# Patient Record
Sex: Female | Born: 1935 | Race: White | Hispanic: No | Smoking: Former smoker
Health system: Southern US, Community
[De-identification: ages and names within clinical notes are randomized; demographics above are authoritative.]

## PROBLEM LIST (undated history)

## (undated) DIAGNOSIS — K219 Gastro-esophageal reflux disease without esophagitis: Secondary | ICD-10-CM

## (undated) DIAGNOSIS — R51 Headache: Secondary | ICD-10-CM

## (undated) DIAGNOSIS — R519 Headache, unspecified: Secondary | ICD-10-CM

## (undated) DIAGNOSIS — M542 Cervicalgia: Secondary | ICD-10-CM

## (undated) DIAGNOSIS — I10 Essential (primary) hypertension: Secondary | ICD-10-CM

## (undated) DIAGNOSIS — E785 Hyperlipidemia, unspecified: Secondary | ICD-10-CM

## (undated) DIAGNOSIS — I639 Cerebral infarction, unspecified: Secondary | ICD-10-CM

## (undated) DIAGNOSIS — M810 Age-related osteoporosis without current pathological fracture: Secondary | ICD-10-CM

## (undated) DIAGNOSIS — I714 Abdominal aortic aneurysm, without rupture, unspecified: Secondary | ICD-10-CM

## (undated) HISTORY — DX: Hyperlipidemia, unspecified: E78.5

## (undated) HISTORY — DX: Age-related osteoporosis without current pathological fracture: M81.0

## (undated) HISTORY — DX: Abdominal aortic aneurysm, without rupture, unspecified: I71.40

## (undated) HISTORY — DX: Essential (primary) hypertension: I10

## (undated) HISTORY — DX: Headache, unspecified: R51.9

## (undated) HISTORY — PX: APPENDECTOMY: SHX54

## (undated) HISTORY — PX: ILIAC ARTERY STENT: SHX1786

## (undated) HISTORY — PX: OTHER SURGICAL HISTORY: SHX169

## (undated) HISTORY — DX: Headache: R51

## (undated) HISTORY — DX: Gastro-esophageal reflux disease without esophagitis: K21.9

## (undated) HISTORY — DX: Cerebral infarction, unspecified: I63.9

## (undated) HISTORY — DX: Cervicalgia: M54.2

---

## 1998-02-17 HISTORY — PX: AUGMENTATION MAMMAPLASTY: SUR837

## 1998-02-22 ENCOUNTER — Ambulatory Visit (HOSPITAL_COMMUNITY): Admission: RE | Admit: 1998-02-22 | Discharge: 1998-02-22 | Payer: Self-pay | Admitting: Family Medicine

## 1998-04-11 ENCOUNTER — Ambulatory Visit (HOSPITAL_BASED_OUTPATIENT_CLINIC_OR_DEPARTMENT_OTHER): Admission: RE | Admit: 1998-04-11 | Discharge: 1998-04-11 | Payer: Self-pay | Admitting: Surgery

## 1998-12-14 ENCOUNTER — Encounter: Payer: Self-pay | Admitting: Family Medicine

## 1998-12-14 ENCOUNTER — Encounter: Admission: RE | Admit: 1998-12-14 | Discharge: 1998-12-14 | Payer: Self-pay | Admitting: Family Medicine

## 1999-02-25 ENCOUNTER — Encounter (INDEPENDENT_AMBULATORY_CARE_PROVIDER_SITE_OTHER): Payer: Self-pay

## 1999-02-25 ENCOUNTER — Other Ambulatory Visit: Admission: RE | Admit: 1999-02-25 | Discharge: 1999-02-25 | Payer: Self-pay | Admitting: Plastic Surgery

## 1999-12-13 ENCOUNTER — Ambulatory Visit (HOSPITAL_COMMUNITY): Admission: RE | Admit: 1999-12-13 | Discharge: 1999-12-13 | Payer: Self-pay | Admitting: Family Medicine

## 1999-12-13 ENCOUNTER — Encounter: Payer: Self-pay | Admitting: Family Medicine

## 1999-12-20 ENCOUNTER — Encounter: Admission: RE | Admit: 1999-12-20 | Discharge: 1999-12-20 | Payer: Self-pay | Admitting: Family Medicine

## 1999-12-20 ENCOUNTER — Encounter: Payer: Self-pay | Admitting: Family Medicine

## 2000-03-27 ENCOUNTER — Other Ambulatory Visit: Admission: RE | Admit: 2000-03-27 | Discharge: 2000-03-27 | Payer: Self-pay | Admitting: Family Medicine

## 2000-03-31 ENCOUNTER — Encounter: Payer: Self-pay | Admitting: Family Medicine

## 2000-03-31 ENCOUNTER — Encounter: Admission: RE | Admit: 2000-03-31 | Discharge: 2000-03-31 | Payer: Self-pay | Admitting: Family Medicine

## 2001-01-27 ENCOUNTER — Encounter: Payer: Self-pay | Admitting: Family Medicine

## 2001-01-27 ENCOUNTER — Encounter: Admission: RE | Admit: 2001-01-27 | Discharge: 2001-01-27 | Payer: Self-pay | Admitting: Family Medicine

## 2001-01-29 ENCOUNTER — Encounter: Payer: Self-pay | Admitting: Family Medicine

## 2001-01-29 ENCOUNTER — Encounter: Admission: RE | Admit: 2001-01-29 | Discharge: 2001-01-29 | Payer: Self-pay | Admitting: Family Medicine

## 2001-03-31 ENCOUNTER — Encounter: Payer: Self-pay | Admitting: Family Medicine

## 2001-03-31 ENCOUNTER — Encounter: Admission: RE | Admit: 2001-03-31 | Discharge: 2001-03-31 | Payer: Self-pay | Admitting: Family Medicine

## 2001-04-06 ENCOUNTER — Other Ambulatory Visit: Admission: RE | Admit: 2001-04-06 | Discharge: 2001-04-06 | Payer: Self-pay | Admitting: Family Medicine

## 2001-04-21 ENCOUNTER — Encounter: Payer: Self-pay | Admitting: Family Medicine

## 2001-04-21 ENCOUNTER — Ambulatory Visit (HOSPITAL_COMMUNITY): Admission: RE | Admit: 2001-04-21 | Discharge: 2001-04-21 | Payer: Self-pay | Admitting: Family Medicine

## 2001-05-03 ENCOUNTER — Encounter: Payer: Self-pay | Admitting: Family Medicine

## 2001-05-03 ENCOUNTER — Encounter: Admission: RE | Admit: 2001-05-03 | Discharge: 2001-05-03 | Payer: Self-pay | Admitting: Family Medicine

## 2001-05-07 ENCOUNTER — Encounter: Admission: RE | Admit: 2001-05-07 | Discharge: 2001-05-07 | Payer: Self-pay | Admitting: Interventional Radiology

## 2001-05-31 ENCOUNTER — Encounter: Admission: RE | Admit: 2001-05-31 | Discharge: 2001-05-31 | Payer: Self-pay | Admitting: Family Medicine

## 2001-05-31 ENCOUNTER — Encounter: Payer: Self-pay | Admitting: Family Medicine

## 2001-11-30 ENCOUNTER — Encounter: Admission: RE | Admit: 2001-11-30 | Discharge: 2001-11-30 | Payer: Self-pay | Admitting: Family Medicine

## 2001-11-30 ENCOUNTER — Encounter: Payer: Self-pay | Admitting: Family Medicine

## 2002-03-31 ENCOUNTER — Other Ambulatory Visit: Admission: RE | Admit: 2002-03-31 | Discharge: 2002-03-31 | Payer: Self-pay | Admitting: Family Medicine

## 2002-04-01 ENCOUNTER — Encounter: Payer: Self-pay | Admitting: Family Medicine

## 2002-04-01 ENCOUNTER — Encounter: Admission: RE | Admit: 2002-04-01 | Discharge: 2002-04-01 | Payer: Self-pay | Admitting: Family Medicine

## 2002-10-17 ENCOUNTER — Encounter: Payer: Self-pay | Admitting: Family Medicine

## 2002-10-17 ENCOUNTER — Encounter: Admission: RE | Admit: 2002-10-17 | Discharge: 2002-10-17 | Payer: Self-pay | Admitting: Family Medicine

## 2002-12-02 ENCOUNTER — Encounter: Payer: Self-pay | Admitting: Family Medicine

## 2002-12-02 ENCOUNTER — Encounter: Admission: RE | Admit: 2002-12-02 | Discharge: 2002-12-02 | Payer: Self-pay | Admitting: Family Medicine

## 2003-05-25 ENCOUNTER — Other Ambulatory Visit: Admission: RE | Admit: 2003-05-25 | Discharge: 2003-05-25 | Payer: Self-pay | Admitting: Family Medicine

## 2003-12-20 ENCOUNTER — Encounter: Admission: RE | Admit: 2003-12-20 | Discharge: 2003-12-20 | Payer: Self-pay | Admitting: Family Medicine

## 2004-05-28 ENCOUNTER — Other Ambulatory Visit: Admission: RE | Admit: 2004-05-28 | Discharge: 2004-05-28 | Payer: Self-pay | Admitting: Family Medicine

## 2005-01-06 ENCOUNTER — Encounter: Admission: RE | Admit: 2005-01-06 | Discharge: 2005-01-06 | Payer: Self-pay | Admitting: Family Medicine

## 2005-04-03 ENCOUNTER — Encounter: Admission: RE | Admit: 2005-04-03 | Discharge: 2005-04-03 | Payer: Self-pay | Admitting: Family Medicine

## 2005-05-01 ENCOUNTER — Ambulatory Visit: Payer: Self-pay | Admitting: Gastroenterology

## 2005-05-14 ENCOUNTER — Encounter (INDEPENDENT_AMBULATORY_CARE_PROVIDER_SITE_OTHER): Payer: Self-pay | Admitting: Specialist

## 2005-05-14 ENCOUNTER — Ambulatory Visit: Payer: Self-pay | Admitting: Gastroenterology

## 2005-05-29 ENCOUNTER — Encounter: Admission: RE | Admit: 2005-05-29 | Discharge: 2005-05-29 | Payer: Self-pay | Admitting: Family Medicine

## 2005-05-29 ENCOUNTER — Other Ambulatory Visit: Admission: RE | Admit: 2005-05-29 | Discharge: 2005-05-29 | Payer: Self-pay | Admitting: Family Medicine

## 2005-06-04 ENCOUNTER — Encounter: Admission: RE | Admit: 2005-06-04 | Discharge: 2005-06-04 | Payer: Self-pay | Admitting: Family Medicine

## 2006-01-12 ENCOUNTER — Encounter: Admission: RE | Admit: 2006-01-12 | Discharge: 2006-01-12 | Payer: Self-pay | Admitting: Family Medicine

## 2006-06-19 ENCOUNTER — Ambulatory Visit: Payer: Self-pay | Admitting: Vascular Surgery

## 2007-01-15 ENCOUNTER — Encounter: Admission: RE | Admit: 2007-01-15 | Discharge: 2007-01-15 | Payer: Self-pay | Admitting: Family Medicine

## 2007-06-07 ENCOUNTER — Encounter: Admission: RE | Admit: 2007-06-07 | Discharge: 2007-06-07 | Payer: Self-pay | Admitting: Family Medicine

## 2007-06-21 ENCOUNTER — Other Ambulatory Visit: Admission: RE | Admit: 2007-06-21 | Discharge: 2007-06-21 | Payer: Self-pay | Admitting: Family Medicine

## 2008-01-17 ENCOUNTER — Encounter: Admission: RE | Admit: 2008-01-17 | Discharge: 2008-01-17 | Payer: Self-pay | Admitting: Family Medicine

## 2008-05-15 ENCOUNTER — Encounter: Admission: RE | Admit: 2008-05-15 | Discharge: 2008-05-15 | Payer: Self-pay | Admitting: Internal Medicine

## 2009-01-17 ENCOUNTER — Encounter: Admission: RE | Admit: 2009-01-17 | Discharge: 2009-01-17 | Payer: Self-pay | Admitting: Internal Medicine

## 2010-01-18 ENCOUNTER — Encounter: Admission: RE | Admit: 2010-01-18 | Discharge: 2010-01-18 | Payer: Self-pay | Admitting: Internal Medicine

## 2010-03-12 ENCOUNTER — Other Ambulatory Visit
Admission: RE | Admit: 2010-03-12 | Discharge: 2010-03-12 | Payer: Self-pay | Source: Home / Self Care | Admitting: Internal Medicine

## 2010-03-12 ENCOUNTER — Other Ambulatory Visit: Payer: Self-pay | Admitting: Internal Medicine

## 2010-05-27 ENCOUNTER — Encounter: Payer: Self-pay | Admitting: Gastroenterology

## 2010-12-24 ENCOUNTER — Other Ambulatory Visit: Payer: Self-pay | Admitting: Internal Medicine

## 2010-12-24 DIAGNOSIS — Z1231 Encounter for screening mammogram for malignant neoplasm of breast: Secondary | ICD-10-CM

## 2011-01-20 ENCOUNTER — Ambulatory Visit
Admission: RE | Admit: 2011-01-20 | Discharge: 2011-01-20 | Disposition: A | Discharge status: Home / Self Care | Payer: Medicare Other | Source: Ambulatory Visit | Attending: Internal Medicine | Admitting: Internal Medicine

## 2011-01-20 DIAGNOSIS — Z1231 Encounter for screening mammogram for malignant neoplasm of breast: Secondary | ICD-10-CM

## 2011-11-05 ENCOUNTER — Encounter: Payer: Self-pay | Admitting: Gastroenterology

## 2011-12-17 ENCOUNTER — Other Ambulatory Visit: Payer: Self-pay | Admitting: Internal Medicine

## 2011-12-17 DIAGNOSIS — Z1231 Encounter for screening mammogram for malignant neoplasm of breast: Secondary | ICD-10-CM

## 2012-01-23 ENCOUNTER — Ambulatory Visit
Admission: RE | Admit: 2012-01-23 | Discharge: 2012-01-23 | Disposition: A | Discharge status: Home / Self Care | Payer: Medicare Other | Source: Ambulatory Visit | Attending: Internal Medicine | Admitting: Internal Medicine

## 2012-01-23 DIAGNOSIS — Z1231 Encounter for screening mammogram for malignant neoplasm of breast: Secondary | ICD-10-CM

## 2012-02-17 ENCOUNTER — Encounter (HOSPITAL_COMMUNITY): Payer: Self-pay | Admitting: Pharmacy Technician

## 2012-03-02 ENCOUNTER — Encounter (HOSPITAL_COMMUNITY)
Admission: RE | Disposition: A | Discharge status: Home / Self Care | Payer: Self-pay | Source: Ambulatory Visit | Attending: Cardiology

## 2012-03-02 ENCOUNTER — Ambulatory Visit (HOSPITAL_COMMUNITY)
Admission: RE | Admit: 2012-03-02 | Discharge: 2012-03-02 | Disposition: A | Discharge status: Home / Self Care | Payer: Medicare Other | Source: Ambulatory Visit | Attending: Cardiology | Admitting: Cardiology

## 2012-03-02 DIAGNOSIS — I70219 Atherosclerosis of native arteries of extremities with intermittent claudication, unspecified extremity: Secondary | ICD-10-CM | POA: Insufficient documentation

## 2012-03-02 HISTORY — PX: ABDOMINAL ANGIOGRAM: SHX5499

## 2012-03-02 SURGERY — ABDOMINAL ANGIOGRAM
Anesthesia: LOCAL | Laterality: Bilateral

## 2012-03-02 MED ORDER — MIDAZOLAM HCL 2 MG/2ML IJ SOLN
INTRAMUSCULAR | Status: AC
  Filled 2012-03-02: qty 2

## 2012-03-02 MED ORDER — ONDANSETRON HCL 4 MG/2ML IJ SOLN: 4.0000 mg | Freq: Four times a day (QID) | INTRAMUSCULAR | Status: DC | PRN

## 2012-03-02 MED ORDER — SODIUM CHLORIDE 0.9 % IV SOLN: 1.0000 mL/kg/h | INTRAVENOUS | Status: DC

## 2012-03-02 MED ORDER — SODIUM CHLORIDE 0.9 % IV SOLN
INTRAVENOUS | Status: DC
  Administered 2012-03-02: 06:00:00 via INTRAVENOUS

## 2012-03-02 MED ORDER — LIDOCAINE HCL (PF) 1 % IJ SOLN
INTRAMUSCULAR | Status: AC
  Filled 2012-03-02: qty 30

## 2012-03-02 MED ORDER — ACETAMINOPHEN 325 MG PO TABS: 650.0000 mg | ORAL_TABLET | ORAL | Status: DC | PRN

## 2012-03-02 MED ORDER — HEPARIN (PORCINE) IN NACL 2-0.9 UNIT/ML-% IJ SOLN
INTRAMUSCULAR | Status: AC
  Filled 2012-03-02: qty 1000

## 2012-03-02 MED ORDER — FENTANYL CITRATE 0.05 MG/ML IJ SOLN
INTRAMUSCULAR | Status: AC
  Filled 2012-03-02: qty 2

## 2012-03-02 NOTE — Progress Notes (Signed)
CLIENT UP AND WALKED AND TOL WELL AND RIGHT GROIN STABLE; NO BLEEDING OR HEMATOMA 

## 2012-03-02 NOTE — H&P (Signed)
  Please see paper chart  

## 2012-03-02 NOTE — CV Procedure (Signed)
Procedures performed: Right Femoral access Abdominal aortogram. Abdominal aortogram, abdominal aortogram with bifemoral runoff. Right femoral arteriogram.   Indication: Claudication involving the right hip. Outpatient arterial duplex had revealed presence of greater than 50% stenosis of the right external iliac artery. History of prior stent implantation to the iliac artery.   Abdominal aortogram: This was performed with the help of a 5 French Pigtail catheter.   Bi-Femoral arterial runoff: Right and left femoral arterial runoff was performed using the same catheter.   Data: There is a small to moderate size distal abdominal aortic aneurysm. The abdominal aorta shows mild diffuse atherosclerotic changes. There are 2 renal arteries one on either sides which are widely patent. The aortoiliac bifurcation is patent with left common iliac artery showing a 40% stenosis. The right distal common/proximal external iliac artery shows a large aneurysm/pseudoaneurysm at the inflow of a previously placed stent. There is a 40-50% stenosis proximal to the inflow of the stent. Distal to the stent there is a 40-50% stenosis followed by mild luminal irregularity. Left external iliac artery and left femoral artery appears to be widely patent with mild luminal irregularity. 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 Bilateral femoral runoff: There is mild luminal irregularity of the bilateral superficial femoral arteries and below the knee there is three-vessel runoff bilaterally with mild diffuse luminal irregularity. Right posterior tibial artery shows focal 70-90% stenosis which appeared not to be flow-limiting.  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 No significant disease below the femoral arteries bilaterally.  Recommendation: I will obtain CT angiogram of the abdomen to evaluate for  the size of the distal abdominal aortic aneurysm. If the aneurysm is not very large, we may consider a covered stent to exclude the iliac artery aneurysm. However if the distal abdominal aortic aneurysm is large, she will need endograft repair of both iliac and abdominal aortic aneurysm. I have discussed the case with Dr. Durene Cal. Patient will be discharged home today with outpatient CT angiogram to be scheduled.  Technique: Using right femoral arterial access I utilized a [employer], [employer]. I did experience resistance to cross the previously placed stent hence I performed right femoral arteriogram through the arterial access sheath. This did show a large aneurysm at the iliac artery. Following this with careful manipulation, I was able to cross the aneurysmal neck and place a pigtail catheter into the abdominal aorta. The 5 French pigtail catheter was utilized to perform abdominal aortogram, pelvic aortogram and abdominal aortogram with bifemoral runoff. The catheter was then pulled out of the body over the Versacore wire. There was no immediate complication. Patient tolerated the procedure well.

## 2012-03-02 NOTE — Interval H&P Note (Signed)
History and Physical Interval Note:  03/02/2012 8:01 AM  Madison Richards  has presented today for surgery, with the diagnosis of claudication  The various methods of treatment have been discussed with the patient and family. After consideration of risks, benefits and other options for treatment, the patient has consented to  Procedure(s) (LRB) with comments: ABDOMINAL ANGIOGRAM (Bilateral) as a surgical intervention .  The patient's history has been reviewed, patient examined, no change in status, stable for surgery.  I have reviewed the patient's chart and labs.  Questions were answered to the patient's satisfaction.     [facility] R  Ms Dow Adolph, 4th year medical student, will be observing procedure.  Patient has given consent.

## 2012-03-02 NOTE — Interval H&P Note (Signed)
History and Physical Interval Note:  03/02/2012 7:58 AM  Madison Richards  has presented today for surgery, with the diagnosis of claudication  The various methods of treatment have been discussed with the patient and family. After consideration of risks, benefits and other options for treatment, the patient has consented to  Procedure(s) (LRB) with comments: ABDOMINAL ANGIOGRAM (Bilateral) and possible PTA  as a surgical intervention .  The patient's history has been reviewed, patient examined, no change in status, stable for surgery.  I have reviewed the patient's chart and labs.  Questions were answered to the patient's satisfaction.     Pamella Pert

## 2012-03-08 ENCOUNTER — Other Ambulatory Visit: Payer: Self-pay | Admitting: Cardiology

## 2012-03-08 DIAGNOSIS — I714 Abdominal aortic aneurysm, without rupture: Secondary | ICD-10-CM

## 2012-03-10 ENCOUNTER — Ambulatory Visit
Admission: RE | Admit: 2012-03-10 | Discharge: 2012-03-10 | Disposition: A | Discharge status: Home / Self Care | Payer: Medicare Other | Source: Ambulatory Visit | Attending: Cardiology | Admitting: Cardiology

## 2012-03-10 DIAGNOSIS — I714 Abdominal aortic aneurysm, without rupture: Secondary | ICD-10-CM

## 2012-03-10 MED ORDER — IOHEXOL 350 MG/ML SOLN
100.0000 mL | Freq: Once | INTRAVENOUS | Status: AC | PRN
  Administered 2012-03-10: 100 mL via INTRAVENOUS

## 2012-03-25 ENCOUNTER — Encounter (HOSPITAL_COMMUNITY): Payer: Self-pay | Admitting: Respiratory Therapy

## 2012-04-05 MED ORDER — SODIUM CHLORIDE 0.9 % IV SOLN
INTRAVENOUS | Status: DC
  Administered 2012-04-06: 06:00:00 via INTRAVENOUS

## 2012-04-06 ENCOUNTER — Ambulatory Visit (HOSPITAL_COMMUNITY)
Admission: RE | Admit: 2012-04-06 | Discharge: 2012-04-06 | Disposition: A | Discharge status: Home / Self Care | Payer: Medicare Other | Source: Ambulatory Visit | Attending: Cardiology | Admitting: Cardiology

## 2012-04-06 ENCOUNTER — Encounter (HOSPITAL_COMMUNITY)
Admission: RE | Disposition: A | Discharge status: Home / Self Care | Payer: Self-pay | Source: Ambulatory Visit | Attending: Cardiology

## 2012-04-06 DIAGNOSIS — I714 Abdominal aortic aneurysm, without rupture, unspecified: Secondary | ICD-10-CM | POA: Insufficient documentation

## 2012-04-06 DIAGNOSIS — I723 Aneurysm of iliac artery: Secondary | ICD-10-CM | POA: Insufficient documentation

## 2012-04-06 DIAGNOSIS — I70219 Atherosclerosis of native arteries of extremities with intermittent claudication, unspecified extremity: Secondary | ICD-10-CM | POA: Insufficient documentation

## 2012-04-06 HISTORY — PX: ABDOMINAL ANGIOGRAM: SHX5499

## 2012-04-06 LAB — POCT ACTIVATED CLOTTING TIME: Activated Clotting Time: 175 seconds

## 2012-04-06 SURGERY — ABDOMINAL ANGIOGRAM
Laterality: Right

## 2012-04-06 MED ORDER — HYDROMORPHONE HCL PF 2 MG/ML IJ SOLN
INTRAMUSCULAR | Status: AC
  Filled 2012-04-06: qty 1

## 2012-04-06 MED ORDER — LIDOCAINE HCL (PF) 1 % IJ SOLN
INTRAMUSCULAR | Status: AC
  Filled 2012-04-06: qty 30

## 2012-04-06 MED ORDER — HEPARIN (PORCINE) IN NACL 2-0.9 UNIT/ML-% IJ SOLN
INTRAMUSCULAR | Status: AC
  Filled 2012-04-06: qty 1000

## 2012-04-06 MED ORDER — MIDAZOLAM HCL 2 MG/2ML IJ SOLN
INTRAMUSCULAR | Status: AC
  Filled 2012-04-06: qty 2

## 2012-04-06 MED ORDER — HEPARIN SODIUM (PORCINE) 1000 UNIT/ML IJ SOLN
INTRAMUSCULAR | Status: AC
  Filled 2012-04-06: qty 1

## 2012-04-06 MED ORDER — ONDANSETRON HCL 4 MG/2ML IJ SOLN: 4.0000 mg | Freq: Four times a day (QID) | INTRAMUSCULAR | Status: DC | PRN

## 2012-04-06 MED ORDER — ACETAMINOPHEN 325 MG PO TABS: 650.0000 mg | ORAL_TABLET | ORAL | Status: DC | PRN

## 2012-04-06 MED ORDER — SODIUM CHLORIDE 0.9 % IV SOLN: 1.0000 mL/kg/h | INTRAVENOUS | Status: DC

## 2012-04-06 NOTE — CV Procedure (Addendum)
1. Abdominal aortogram 2. Bilateral, left and right femoral arterial access 3. PTA and stenting of the right common and external iliac artery with implantation of a 8.0 x 100 mm Viabahn covered stent, post stent dilatation with 8.0 x 100 mm Mustang and a 7.0 x 20 mm Mustang balloon at the site of stenting.  Indication: Patient is a 78 year old Caucasian female with known peripheral arterial he sees was been complaining of right hip claudication worse in the left hip claudication. She did undergone diagnostic peripheral angiography 3 weeks ago, was found to have abdominal aortic aneurysm, infrarenal and a very large aneurysm involving the right common iliac artery at the inflow of a previously placed stent. Hence she was scheduled for elective angioplasty to exclude the aneurysm as it was very large and symptomatic. She also has outpatient CT angiography of the abdomen and pelvis to evaluate for distal abdominal aortic aneurysm and a size. It was a very small aneurysm hence felt suitable for percutaneous exclusion of the right common iliac artery aneurysm.   Interventional data: Successful PTA and stenting of the right common and external iliac artery with complete exclusion of the right common iliac artery aneurysm by implantation of a 8.0 x 100 mm Viabahn covered stent. Excellent position with no residual leak was evident at the end of the procedure.  Recommendation: Patient will be clinically followed left common iliac artery stenosis. Only if she becomes symptomatic then I will consider angioplasty of the left common iliac artery which appears to have about a 70% stenosis. This will essentially exclude her from endovascular repair of the abdominal aortic aneurysm, however the aneurysm is fairly small and patient is 78 years of age, and the chances of its growth to be surgically treated is probably low but not absent.  Technique of the procedure: Under sterile precautions using 5 French left femoral  arterial access, a 5 French pigtail catheter was advanced into the abdominal aorta over a Versacore wire. Abdominal aortogram was performed. The iliac artery aneurysm was carefully analyzed. Then I obtained a 7 French right femoral arterial access and under careful radiographic guidance, advanced a Versacore wire into the abdominal aorta making sure that the wire does not go through the stent struts and passed through the aneurysm neck without causing trauma. Then advanced a 7 French 45 cm bright tip sheath into the distal abdominal aorta.  I used 3000 units of intravenous heparin and ACT was 200 seconds, I proceeded with implantation of a covered stent, 8.0 x 100 mm Viabahn covered stent and deployed it under fluoroscopy guidance. This was post dilated with the above said balloon for 60 seconds each and repeat angiography revealed excellent results. And the pigtail catheter on the left femoral artery was then withdrawn over the Versacore wire. The sheaths were then sutured in place. Patient tolerated the procedure well. There was no immediate competitions.

## 2012-04-06 NOTE — Interval H&P Note (Signed)
History and Physical Interval Note:  04/06/2012 7:45 AM  Madison Richards  has presented today for surgery, with the diagnosis of pad  The various methods of treatment have been discussed with the patient and family. After consideration of risks, benefits and other options for treatment, the patient has consented to  Procedure(s): LOWER EXTREMITY ANGIOGRAM (N/A) and angioplasty as a surgical intervention .  The patient's history has been reviewed, patient examined, no change in status, stable for surgery.  I have reviewed the patient's chart and labs.  Questions were answered to the patient's satisfaction.     Pamella Pert

## 2012-04-06 NOTE — H&P (Signed)
  Please see office visit notes for complete details of HPI.  

## 2012-12-28 ENCOUNTER — Encounter: Payer: Self-pay | Admitting: Gastroenterology

## 2012-12-28 ENCOUNTER — Other Ambulatory Visit: Payer: Self-pay

## 2012-12-28 DIAGNOSIS — Z1231 Encounter for screening mammogram for malignant neoplasm of breast: Secondary | ICD-10-CM

## 2013-01-28 ENCOUNTER — Ambulatory Visit
Admission: RE | Admit: 2013-01-28 | Discharge: 2013-01-28 | Disposition: A | Discharge status: Home / Self Care | Payer: Medicare Other | Source: Ambulatory Visit

## 2013-01-28 DIAGNOSIS — Z1231 Encounter for screening mammogram for malignant neoplasm of breast: Secondary | ICD-10-CM

## 2013-02-22 ENCOUNTER — Ambulatory Visit (AMBULATORY_SURGERY_CENTER): Payer: Self-pay

## 2013-02-22 VITALS — Ht 64.0 in | Wt 123.2 lb

## 2013-02-22 DIAGNOSIS — Z86010 Personal history of colonic polyps: Secondary | ICD-10-CM

## 2013-02-22 MED ORDER — NA SULFATE-K SULFATE-MG SULF 17.5-3.13-1.6 GM/177ML PO SOLN: ORAL | Status: DC

## 2013-02-23 ENCOUNTER — Encounter: Payer: Self-pay | Admitting: Gastroenterology

## 2013-03-08 ENCOUNTER — Ambulatory Visit (AMBULATORY_SURGERY_CENTER): Payer: Medicare Other | Admitting: Gastroenterology

## 2013-03-08 ENCOUNTER — Encounter: Payer: Self-pay | Admitting: Gastroenterology

## 2013-03-08 VITALS — BP 120/47 | HR 58 | Temp 96.6°F | Resp 17 | Ht 64.0 in | Wt 123.0 lb

## 2013-03-08 DIAGNOSIS — Z86010 Personal history of colonic polyps: Secondary | ICD-10-CM

## 2013-03-08 DIAGNOSIS — K648 Other hemorrhoids: Secondary | ICD-10-CM

## 2013-03-08 DIAGNOSIS — D126 Benign neoplasm of colon, unspecified: Secondary | ICD-10-CM

## 2013-03-08 DIAGNOSIS — K5520 Angiodysplasia of colon without hemorrhage: Secondary | ICD-10-CM

## 2013-03-08 DIAGNOSIS — K5730 Diverticulosis of large intestine without perforation or abscess without bleeding: Secondary | ICD-10-CM

## 2013-03-08 MED ORDER — SODIUM CHLORIDE 0.9 % IV SOLN: 500.0000 mL | INTRAVENOUS | Status: DC

## 2013-03-08 NOTE — Progress Notes (Signed)
NO EGG OR SOY ALLERGY. EMW NO PROBLEMS WITH PAST SEDATION. EMW

## 2013-03-08 NOTE — Patient Instructions (Signed)
YOU HAD AN ENDOSCOPIC PROCEDURE TODAY AT THE [facility]: Refer to the procedure report that was given to you for any specific questions about what was found during the examination.  If the procedure report does not answer your questions, please call your gastroenterologist to clarify.  If you requested that your care partner not be given the details of your procedure findings, then the procedure report has been included in a sealed envelope for you to review at your convenience later.  YOU SHOULD EXPECT: Some feelings of bloating in the abdomen. Passage of more gas than usual.  Walking can help get rid of the air that was put into your GI tract during the procedure and reduce the bloating. If you had a lower endoscopy (such as a colonoscopy or flexible sigmoidoscopy) you may notice spotting of blood in your stool or on the toilet paper. If you underwent a bowel prep for your procedure, then you may not have a normal bowel movement for a few days.  DIET: Your first meal following the procedure should be a light meal and then it is ok to progress to your normal diet.  A half-sandwich or bowl of soup is an example of a good first meal.  Heavy or fried foods are harder to digest and may make you feel nauseous or bloated.  Likewise meals heavy in dairy and vegetables can cause extra gas to form and this can also increase the bloating.  Drink plenty of fluids but you should avoid alcoholic beverages for 24 hours.  ACTIVITY: Your care partner should take you home directly after the procedure.  You should plan to take it easy, moving slowly for the rest of the day.  You can resume normal activity the day after the procedure however you should NOT DRIVE or use heavy machinery for 24 hours (because of the sedation medicines used during the test).    SYMPTOMS TO REPORT IMMEDIATELY: A gastroenterologist can be reached at any hour.  During normal business hours, 8:30 AM to 5:00 PM Monday through Friday,  call [phone].  After hours and on weekends, please call the GI answering service at [phone] who will take a message and have the physician on call contact you.   Following lower endoscopy (colonoscopy or flexible sigmoidoscopy):  Excessive amounts of blood in the stool  Significant tenderness or worsening of abdominal pains  Swelling of the abdomen that is new, acute  Fever of 100F or higher    FOLLOW UP: If any biopsies were taken you will be contacted by phone or by letter within the next 1-3 weeks.  Call your gastroenterologist if you have not heard about the biopsies in 3 weeks.  Our staff will call the home number listed on your records the next business day following your procedure to check on you and address any questions or concerns that you may have at that time regarding the information given to you following your procedure. This is a courtesy call and so if there is no answer at the home number and we have not heard from you through the emergency physician on call, we will assume that you have returned to your regular daily activities without incident.  SIGNATURES/CONFIDENTIALITY: You and/or your care partner have signed paperwork which will be entered into your electronic medical record.  These signatures attest to the fact that that the information above on your After Visit Summary has been reviewed and is understood.  Full responsibility of the confidentiality   of this discharge information lies with you and/or your care-partner.    Information on polyps,diverticulosis,hemorrhoids & high fiber diet given to you today

## 2013-03-08 NOTE — Op Note (Signed)
[place]  [address] [place], [id number]   COLONOSCOPY PROCEDURE REPORT  PATIENT: Madison, Richards  MR#: [id number] BIRTHDATE: 09-11-1934 , 78  yrs. old GENDER: Female ENDOSCOPIST: Inda Castle, MD REFERRED BW:GYKZL Kim, M.D. PROCEDURE DATE:  03/08/2013 PROCEDURE:   Colonoscopy with snare polypectomy First Screening Colonoscopy - Avg.  risk and is 50 yrs.  old or older - No.  Prior Negative Screening - Now for repeat screening. N/A  History of Adenoma - Now for follow-up colonoscopy & has been > or = to 3 yrs.  Yes hx of adenoma.  Has been 3 or more years since last colonoscopy.  Polyps Removed Today? Yes. ASA CLASS:   Class II INDICATIONS:Patient's personal history of colon polyps.   2007 MEDICATIONS: propofol (Diprivan) 200mg  IV  DESCRIPTION OF PROCEDURE:   After the risks benefits and alternatives of the procedure were thoroughly explained, informed consent was obtained.  A digital rectal exam revealed no abnormalities of the rectum.   The [id number]  endoscope was introduced through the anus and advanced to the cecum, which was identified by both the appendix and ileocecal valve. No adverse events experienced.   The quality of the prep was excellent using Suprep  The instrument was then slowly withdrawn as the colon was fully examined.      COLON FINDINGS: Two sessile polyps ranging between 3-23mm in size were found in the sigmoid colon.  A polypectomy was performed with a cold snare.  The resection was complete and the polyp tissue was partially retrieved.   There was severe diverticulosis noted in the sigmoid colon with associated angulation and colonic narrowing. Mild diverticulosis was noted in the transverse colon.   Internal hemorrhoids were found.  Retroflexed views revealed no abnormalities. The time to cecum=3 minutes 45 seconds.  Withdrawal time=12 minutes 15 seconds.  The scope was withdrawn and the procedure  completed. COMPLICATIONS: There were no complications.  ENDOSCOPIC IMPRESSION: 1.   Two sessile polyps ranging between 3-42mm in size were found in the sigmoid colon; polypectomy was performed with a cold snare 2.   There was severe diverticulosis noted in the sigmoid colon 3.   Mild diverticulosis was noted in the transverse colon 4.   Internal hemorrhoids 5.  colonic AVM  RECOMMENDATIONS: If the polyp(s) removed today are proven to be adenomatous (pre-cancerous) polyps, you will need a repeat colonoscopy in 5 years.  Otherwise you should continue to follow colorectal cancer screening guidelines for "routine risk" patients with colonoscopy in 10 years.  You will receive a letter within 1-2 weeks with the results of your biopsy as well as final recommendations.  Please call my office if you have not received a letter after 3 weeks.   eSigned:  Inda Castle, MD 03/08/2013 10:09 AM   cc:   PATIENT NAME:  Madison, Richards MR#: [id number]

## 2013-03-08 NOTE — Progress Notes (Signed)
Called to room to assist during endoscopic procedure.  Patient ID and intended procedure confirmed with present staff. Received instructions for my participation in the procedure from the performing physician.  

## 2013-03-09 ENCOUNTER — Telehealth: Payer: Self-pay | Admitting: *Deleted

## 2013-03-09 NOTE — Telephone Encounter (Signed)
  Follow up Call-  Call back number 03/08/2013  Post procedure Call Back phone  # [phone]  Permission to leave phone message Yes     Patient questions:  Do you have a fever, pain , or abdominal swelling? no Pain Score  0 *  Have you tolerated food without any problems? yes  Have you been able to return to your normal activities? yes  Do you have any questions about your discharge instructions: Diet   no Medications  no Follow up visit  no  Do you have questions or concerns about your Care? no  Actions: * If pain score is 4 or above: No action needed, pain <4.

## 2013-03-16 ENCOUNTER — Encounter: Payer: Self-pay | Admitting: Gastroenterology

## 2013-03-16 ENCOUNTER — Encounter: Payer: Self-pay | Admitting: *Deleted

## 2014-01-03 ENCOUNTER — Other Ambulatory Visit: Payer: Self-pay

## 2014-01-03 DIAGNOSIS — Z1231 Encounter for screening mammogram for malignant neoplasm of breast: Secondary | ICD-10-CM

## 2014-01-26 ENCOUNTER — Encounter (HOSPITAL_COMMUNITY): Payer: Self-pay | Admitting: Cardiology

## 2014-02-01 ENCOUNTER — Ambulatory Visit
Admission: RE | Admit: 2014-02-01 | Discharge: 2014-02-01 | Disposition: A | Discharge status: Home / Self Care | Payer: Medicare Other | Source: Ambulatory Visit

## 2014-02-01 DIAGNOSIS — Z1231 Encounter for screening mammogram for malignant neoplasm of breast: Secondary | ICD-10-CM

## 2014-05-26 ENCOUNTER — Encounter (HOSPITAL_COMMUNITY): Payer: Self-pay | Admitting: Certified Registered"

## 2014-05-26 ENCOUNTER — Inpatient Hospital Stay (HOSPITAL_BASED_OUTPATIENT_CLINIC_OR_DEPARTMENT_OTHER)
Admission: EM | Admit: 2014-05-26 | Discharge: 2014-05-30 | DRG: 301 | Disposition: A | Discharge status: Home / Self Care | Payer: Medicare Other | Attending: Vascular Surgery | Admitting: Vascular Surgery

## 2014-05-26 ENCOUNTER — Emergency Department (HOSPITAL_BASED_OUTPATIENT_CLINIC_OR_DEPARTMENT_OTHER): Payer: Medicare Other

## 2014-05-26 ENCOUNTER — Encounter (HOSPITAL_COMMUNITY)
Admission: EM | Disposition: A | Discharge status: Home / Self Care | Payer: Self-pay | Source: Home / Self Care | Attending: Vascular Surgery

## 2014-05-26 ENCOUNTER — Encounter (HOSPITAL_BASED_OUTPATIENT_CLINIC_OR_DEPARTMENT_OTHER): Payer: Self-pay | Admitting: *Deleted

## 2014-05-26 DIAGNOSIS — I745 Embolism and thrombosis of iliac artery: Principal | ICD-10-CM | POA: Diagnosis present

## 2014-05-26 DIAGNOSIS — K219 Gastro-esophageal reflux disease without esophagitis: Secondary | ICD-10-CM | POA: Diagnosis present

## 2014-05-26 DIAGNOSIS — M810 Age-related osteoporosis without current pathological fracture: Secondary | ICD-10-CM | POA: Diagnosis present

## 2014-05-26 DIAGNOSIS — E876 Hypokalemia: Secondary | ICD-10-CM | POA: Diagnosis present

## 2014-05-26 DIAGNOSIS — I714 Abdominal aortic aneurysm, without rupture: Secondary | ICD-10-CM | POA: Diagnosis present

## 2014-05-26 DIAGNOSIS — I10 Essential (primary) hypertension: Secondary | ICD-10-CM | POA: Diagnosis present

## 2014-05-26 DIAGNOSIS — Z720 Tobacco use: Secondary | ICD-10-CM | POA: Diagnosis not present

## 2014-05-26 DIAGNOSIS — E785 Hyperlipidemia, unspecified: Secondary | ICD-10-CM | POA: Diagnosis present

## 2014-05-26 DIAGNOSIS — Z95828 Presence of other vascular implants and grafts: Secondary | ICD-10-CM | POA: Diagnosis not present

## 2014-05-26 DIAGNOSIS — I998 Other disorder of circulatory system: Secondary | ICD-10-CM | POA: Diagnosis not present

## 2014-05-26 DIAGNOSIS — Z7982 Long term (current) use of aspirin: Secondary | ICD-10-CM | POA: Diagnosis not present

## 2014-05-26 DIAGNOSIS — F17210 Nicotine dependence, cigarettes, uncomplicated: Secondary | ICD-10-CM | POA: Diagnosis present

## 2014-05-26 DIAGNOSIS — I70229 Atherosclerosis of native arteries of extremities with rest pain, unspecified extremity: Secondary | ICD-10-CM

## 2014-05-26 DIAGNOSIS — M79604 Pain in right leg: Secondary | ICD-10-CM | POA: Diagnosis present

## 2014-05-26 DIAGNOSIS — R0989 Other specified symptoms and signs involving the circulatory and respiratory systems: Secondary | ICD-10-CM

## 2014-05-26 LAB — CBC WITH DIFFERENTIAL/PLATELET
BASOS ABS: 0.1 10*3/uL (ref 0.0–0.1)
BASOS PCT: 0 % (ref 0–1)
Eosinophils Absolute: 0.2 10*3/uL (ref 0.0–0.7)
Eosinophils Relative: 2 % (ref 0–5)
HCT: 46.1 % — ABNORMAL HIGH (ref 36.0–46.0)
Hemoglobin: 15.9 g/dL — ABNORMAL HIGH (ref 12.0–15.0)
LYMPHS PCT: 21 % (ref 12–46)
Lymphs Abs: 2.5 10*3/uL (ref 0.7–4.0)
MCH: 33.3 pg (ref 26.0–34.0)
MCHC: 34.5 g/dL (ref 30.0–36.0)
MCV: 96.4 fL (ref 78.0–100.0)
Monocytes Absolute: 1.1 10*3/uL — ABNORMAL HIGH (ref 0.1–1.0)
Monocytes Relative: 9 % (ref 3–12)
NEUTROS ABS: 8.3 10*3/uL — AB (ref 1.7–7.7)
Neutrophils Relative %: 68 % (ref 43–77)
PLATELETS: 279 10*3/uL (ref 150–400)
RBC: 4.78 MIL/uL (ref 3.87–5.11)
RDW: 13.6 % (ref 11.5–15.5)
WBC: 12.2 10*3/uL — ABNORMAL HIGH (ref 4.0–10.5)

## 2014-05-26 LAB — PROTIME-INR
INR: 0.94 (ref 0.00–1.49)
Prothrombin Time: 12.6 seconds (ref 11.6–15.2)

## 2014-05-26 LAB — COMPREHENSIVE METABOLIC PANEL
ALBUMIN: 4.2 g/dL (ref 3.5–5.2)
ALT: 18 U/L (ref 0–35)
AST: 26 U/L (ref 0–37)
Alkaline Phosphatase: 78 U/L (ref 39–117)
Anion gap: 11 (ref 5–15)
BUN: 32 mg/dL — ABNORMAL HIGH (ref 6–23)
CALCIUM: 10.3 mg/dL (ref 8.4–10.5)
CHLORIDE: 104 mmol/L (ref 96–112)
CO2: 24 mmol/L (ref 19–32)
CREATININE: 0.8 mg/dL (ref 0.50–1.10)
GFR calc Af Amer: 80 mL/min — ABNORMAL LOW (ref >90)
GFR, EST NON AFRICAN AMERICAN: 69 mL/min — AB (ref >90)
Glucose, Bld: 190 mg/dL — ABNORMAL HIGH (ref 70–99)
POTASSIUM: 3.3 mmol/L — AB (ref 3.5–5.1)
SODIUM: 139 mmol/L (ref 135–145)
Total Bilirubin: 0.2 mg/dL — ABNORMAL LOW (ref 0.3–1.2)
Total Protein: 7.4 g/dL (ref 6.0–8.3)

## 2014-05-26 LAB — I-STAT CG4 LACTIC ACID, ED: Lactic Acid, Venous: 2.58 mmol/L (ref 0.5–2.0)

## 2014-05-26 SURGERY — BYPASS GRAFT FEMORAL-POPLITEAL ARTERY
Anesthesia: General | Laterality: Right

## 2014-05-26 MED ORDER — SODIUM CHLORIDE 0.9 % IV BOLUS (SEPSIS)
500.0000 mL | Freq: Once | INTRAVENOUS | Status: AC
  Administered 2014-05-26: 500 mL via INTRAVENOUS

## 2014-05-26 MED ORDER — SODIUM CHLORIDE 0.9 % IV SOLN
INTRAVENOUS | Status: DC
Start: 2014-05-26 — End: 2014-05-28
  Administered 2014-05-26 – 2014-05-27 (×3): via INTRAVENOUS

## 2014-05-26 MED ORDER — HYDROMORPHONE HCL 1 MG/ML IJ SOLN
0.5000 mg | Freq: Once | INTRAMUSCULAR | Status: AC
  Administered 2014-05-26: 19:00:00 via INTRAVENOUS
  Filled 2014-05-26: qty 1

## 2014-05-26 MED ORDER — ONDANSETRON HCL 4 MG/2ML IJ SOLN
4.0000 mg | Freq: Four times a day (QID) | INTRAMUSCULAR | Status: DC | PRN
  Administered 2014-05-26: 4 mg via INTRAVENOUS
  Filled 2014-05-26: qty 2

## 2014-05-26 MED ORDER — OXYCODONE-ACETAMINOPHEN 5-325 MG PO TABS
1.0000 | ORAL_TABLET | ORAL | Status: DC | PRN
  Administered 2014-05-26 – 2014-05-29 (×3): 1 via ORAL
  Filled 2014-05-26 (×4): qty 1

## 2014-05-26 MED ORDER — FENTANYL CITRATE 0.05 MG/ML IJ SOLN
50.0000 ug | Freq: Once | INTRAMUSCULAR | Status: AC
  Administered 2014-05-26: 50 ug via INTRAVENOUS
  Filled 2014-05-26: qty 2

## 2014-05-26 MED ORDER — HYDROMORPHONE HCL 1 MG/ML IJ SOLN
0.5000 mg | Freq: Once | INTRAMUSCULAR | Status: AC
  Administered 2014-05-26: 0.5 mg via INTRAVENOUS
  Filled 2014-05-26: qty 1

## 2014-05-26 MED ORDER — IOHEXOL 350 MG/ML SOLN
150.0000 mL | Freq: Once | INTRAVENOUS | Status: AC | PRN
  Administered 2014-05-26: 150 mL via INTRAVENOUS

## 2014-05-26 MED ORDER — HEPARIN (PORCINE) IN NACL 100-0.45 UNIT/ML-% IJ SOLN
800.0000 [IU]/h | INTRAMUSCULAR | Status: DC
  Administered 2014-05-26: 950 [IU]/h via INTRAVENOUS
  Filled 2014-05-26 (×2): qty 250

## 2014-05-26 MED ORDER — HEPARIN (PORCINE) IN NACL 100-0.45 UNIT/ML-% IJ SOLN
INTRAMUSCULAR | Status: AC
  Filled 2014-05-26: qty 250

## 2014-05-26 MED ORDER — HEPARIN BOLUS VIA INFUSION
3000.0000 [IU] | Freq: Once | INTRAVENOUS | Status: AC
  Administered 2014-05-26: 3000 [IU] via INTRAVENOUS

## 2014-05-26 SURGICAL SUPPLY — 48 items
BANDAGE ELASTIC 4 VELCRO ST LF (GAUZE/BANDAGES/DRESSINGS) IMPLANT
BANDAGE ESMARK 6X9 LF (GAUZE/BANDAGES/DRESSINGS) IMPLANT
BNDG ESMARK 6X9 LF (GAUZE/BANDAGES/DRESSINGS)
CANISTER SUCTION 2500CC (MISCELLANEOUS) ×2 IMPLANT
CANNULA VESSEL 3MM 2 BLNT TIP (CANNULA) ×4 IMPLANT
CLIP TI MEDIUM 24 (CLIP) ×2 IMPLANT
CLIP TI WIDE RED SMALL 24 (CLIP) ×2 IMPLANT
CUFF TOURNIQUET SINGLE 24IN (TOURNIQUET CUFF) IMPLANT
CUFF TOURNIQUET SINGLE 34IN LL (TOURNIQUET CUFF) IMPLANT
CUFF TOURNIQUET SINGLE 44IN (TOURNIQUET CUFF) IMPLANT
DRAIN CHANNEL 15F RND FF W/TCR (WOUND CARE) IMPLANT
DRAPE X-RAY CASS 24X20 (DRAPES) IMPLANT
DRSG COVADERM 4X10 (GAUZE/BANDAGES/DRESSINGS) IMPLANT
DRSG COVADERM 4X8 (GAUZE/BANDAGES/DRESSINGS) IMPLANT
ELECT REM PT RETURN 9FT ADLT (ELECTROSURGICAL) ×2
ELECTRODE REM PT RTRN 9FT ADLT (ELECTROSURGICAL) ×1 IMPLANT
EVACUATOR SILICONE 100CC (DRAIN) IMPLANT
GLOVE BIO SURGEON STRL SZ7.5 (GLOVE) ×2 IMPLANT
GLOVE BIOGEL PI IND STRL 8 (GLOVE) ×1 IMPLANT
GLOVE BIOGEL PI INDICATOR 8 (GLOVE) ×1
GOWN STRL REUS W/ TWL LRG LVL3 (GOWN DISPOSABLE) ×3 IMPLANT
GOWN STRL REUS W/TWL LRG LVL3 (GOWN DISPOSABLE) ×3
KIT BASIN OR (CUSTOM PROCEDURE TRAY) ×2 IMPLANT
KIT ROOM TURNOVER OR (KITS) ×2 IMPLANT
MARKER GRAFT CORONARY BYPASS (MISCELLANEOUS) IMPLANT
NS IRRIG 1000ML POUR BTL (IV SOLUTION) ×4 IMPLANT
PACK PERIPHERAL VASCULAR (CUSTOM PROCEDURE TRAY) ×2 IMPLANT
PAD ARMBOARD 7.5X6 YLW CONV (MISCELLANEOUS) ×4 IMPLANT
PADDING CAST COTTON 6X4 STRL (CAST SUPPLIES) IMPLANT
SET COLLECT BLD 21X3/4 12 (NEEDLE) IMPLANT
SPONGE SURGIFOAM ABS GEL 100 (HEMOSTASIS) IMPLANT
STAPLER VISISTAT (STAPLE) IMPLANT
STOPCOCK 4 WAY LG BORE MALE ST (IV SETS) IMPLANT
SUT ETHILON 3 0 PS 1 (SUTURE) IMPLANT
SUT PROLENE 5 0 C 1 24 (SUTURE) ×2 IMPLANT
SUT PROLENE 6 0 BV (SUTURE) ×2 IMPLANT
SUT PROLENE 7 0 BV 1 (SUTURE) IMPLANT
SUT SILK 2 0 FS (SUTURE) ×2 IMPLANT
SUT SILK 3 0 (SUTURE)
SUT SILK 3-0 18XBRD TIE 12 (SUTURE) IMPLANT
SUT VIC AB 2-0 CTB1 (SUTURE) ×4 IMPLANT
SUT VIC AB 3-0 SH 27 (SUTURE) ×2
SUT VIC AB 3-0 SH 27X BRD (SUTURE) ×2 IMPLANT
SUT VICRYL 4-0 PS2 18IN ABS (SUTURE) ×4 IMPLANT
TRAY FOLEY CATH 16FRSI W/METER (SET/KITS/TRAYS/PACK) ×2 IMPLANT
TUBING EXTENTION W/L.L. (IV SETS) IMPLANT
UNDERPAD 30X30 INCONTINENT (UNDERPADS AND DIAPERS) ×2 IMPLANT
WATER STERILE IRR 1000ML POUR (IV SOLUTION) ×2 IMPLANT

## 2014-05-26 NOTE — ED Notes (Signed)
Pt reporting nausea, no vomiting at this time.

## 2014-05-26 NOTE — H&P (Signed)
[facility]  Patient name: Madison Richards MRN: [id number] DOB: 1934/05/12 Sex: female  REASON FOR ADMISSION: Ischemic right lower extremity  HPI: Madison Richards is a 80 y.o. female who developed the sudden onset of pain in the right lower extremity at approximately 4:30 PM today. The patient is followed by Dr. Everitt Amber. She has a known small distal abdominal aortic aneurysm and bilateral iliac artery occlusive disease. She apparently had a right common iliac artery stent placed in 2003. 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 On 04/06/2012 this was treated with a long covered stent which extended from above the aneurysm in the common iliac artery down into the distal external iliac artery on the right. She states that she has had right hip claudication but denies significant calf claudication and has had no significant symptoms in the left lower extremity. She denies any history of rest pain or history of nonhealing ulcers.  The pain in her right leg developed suddenly at 4:30 PM and has improved slightly since she was started on heparin. In addition she has received some pain medication. She was seen in [facility], and the case was discussed with Dr. Everitt Amber, who recommended a CT angiogram. The CT angiogram showed that the common iliac artery stent was patent on the right although there was clot at the distal aspect of the stent and also the patient had an occluded popliteal artery.  The patient's risk factors for vascular disease include hypertension, hypercholesterolemia, a history of premature cardiovascular disease, and continued tobacco use. She smokes 4-5 cigarettes a day and has been smoking since she was 80 years old.  I do not see any contraindications to thrombolysis. Specifically she denies any history of GI bleed or any history of brain tumors or stroke.  Past Medical  History  Diagnosis Date  . Hypertension   . Hyperlipidemia   . Osteoporosis   . GERD (gastroesophageal reflux disease)   . Neck pain   . Generalized headaches     due to neck pain    Family History  Problem Relation Age of Onset  . Heart disease Sister   . Heart disease Brother   . Heart disease Brother   . Colon cancer Neg Hx   she had a brother with heart disease in his 79s.  SOCIAL HISTORY: History  Substance Use Topics  . Smoking status: Current Some Day Smoker  . Smokeless tobacco: Never Used  . Alcohol Use: 4.2 oz/week    7 Glasses of wine per week    Allergies  Allergen Reactions  . Clindamycin/Lincomycin Rash    Rash in mouth  . Neosporin [Neomycin-Polymyxin-Gramicidin] Rash  . Penicillins Rash    Rash in mouth    Current Facility-Administered Medications  Medication Dose Route Frequency Provider Last Rate Last Dose  . heparin ADULT infusion 100 units/mL (25000 units/250 mL)  950 Units/hr Intravenous Continuous Pamella Pert, MD 9.5 mL/hr at 05/26/14 1856 950 Units/hr at 05/26/14 1856   Current Outpatient Prescriptions  Medication Sig Dispense Refill  . Amlodipine-Valsartan-HCTZ (EXFORGE HCT) 5-160-12.5 MG TABS Take 1 tablet by mouth daily.    Marland Kitchen aspirin EC 81 MG tablet Take 81 mg by mouth daily.    . Calcium Carbonate-Vitamin D (CALCIUM 600 + D PO) Take 1 tablet by mouth 2 (two) times daily.    . celecoxib (CELEBREX) 200 MG capsule Take 200 mg by mouth daily  as needed. For pain    . Cholecalciferol (VITAMIN D3) 5000 UNITS TABS Take 1 tablet by mouth every Monday, Wednesday, and Friday.    . colesevelam (WELCHOL) 625 MG tablet Take 625 mg by mouth 2 (two) times daily with a meal.     . ezetimibe (ZETIA) 10 MG tablet Take 10 mg by mouth daily.    . fluticasone (FLONASE) 50 MCG/ACT nasal spray Place 2 sprays into the nose daily.    Marland Kitchen loratadine (CLARITIN) 10 MG tablet Take 10 mg by mouth as needed.     . metoprolol tartrate (LOPRESSOR) 25 MG tablet Take 25  mg by mouth 2 (two) times daily.    . Multiple Vitamin (MULTIVITAMIN WITH MINERALS) TABS Take 1 tablet by mouth daily.    . Pitavastatin Calcium (LIVALO) 2 MG TABS Take 1 tablet by mouth every Monday, Wednesday, and Friday.       REVIEW OF SYSTEMS: Valu.Nieves ] denotes positive finding; [  ] denotes negative finding CARDIOVASCULAR:  [ ]  chest pain   [ ]  chest pressure   [ ]  palpitations   [ ]  orthopnea   [ ]  dyspnea on exertion   Valu.Nieves ] claudication - right hip   [ ]  rest pain   [ ]  DVT   [ ]  phlebitis PULMONARY:   [ ]  productive cough   [ ]  asthma   [ ]  wheezing NEUROLOGIC:   [ ]  weakness  Valu.Nieves ] paresthesias - right foot [ ]  aphasia  [ ]  amaurosis  [ ]  dizziness HEMATOLOGIC:   [ ]  bleeding problems   [ ]  clotting disorders MUSCULOSKELETAL:  Valu.Nieves ] joint pain   [ ]  joint swelling [ ]  leg swelling GASTROINTESTINAL: [ ]   blood in stool  [ ]   hematemesis GENITOURINARY:  [ ]   dysuria  [ ]   hematuria PSYCHIATRIC:  [ ]  history of major depression INTEGUMENTARY:  [ ]  rashes  [ ]  ulcers CONSTITUTIONAL:  [ ]  fever   [ ]  chills  PHYSICAL EXAM: Filed Vitals:   05/26/14 2030 05/26/14 2100 05/26/14 2208 05/26/14 2230  BP: 147/70 156/74 137/48 150/63  Pulse: 93 84 82 91  Temp:   97.4 F (36.3 C)   TempSrc:   Oral   Resp:  14 16   Height:      Weight:      SpO2: 91% 93% 93% 95%   Body mass index is 20.07 kg/(m^2). GENERAL: The patient is a well-nourished female, in no acute distress. The vital signs are documented above. CARDIOVASCULAR: There is a regular rate and rhythm. I do not detect carotid bruits. She has palpable femoral pulses although slightly diminished. I cannot palpate popliteal or pedal pulses. She has brisk Doppler signals in the dorsalis pedis and posterior tibial positions on the left. I'm unable to obtain Doppler signals in the right foot. Motor function is intact with slightly decreased sensation in the right foot. Right foot is slightly cooler than the left. There is no significant lower  extremity swelling. PULMONARY: There is good air exchange bilaterally without wheezing or rales. ABDOMEN: Soft and non-tender with normal pitched bowel sounds.  MUSCULOSKELETAL: There are no major deformities. NEUROLOGIC: No focal weakness or paresthesias are detected. SKIN: There are no ulcers or rashes noted. PSYCHIATRIC: The patient has a normal affect.  DATA:  Lab Results  Component Value Date   WBC 12.2* 05/26/2014   HGB 15.9* 05/26/2014   HCT 46.1* 05/26/2014   MCV 96.4 05/26/2014   PLT  279 05/26/2014   Lab Results  Component Value Date   NA 139 05/26/2014   K 3.3* 05/26/2014   CL 104 05/26/2014   CO2 24 05/26/2014   Lab Results  Component Value Date   CREATININE 0.80 05/26/2014   Lab Results  Component Value Date   INR 0.94 05/26/2014   I reviewed her arteriograms from 03/02/2012, and 04/06/2012. The results are described above.  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 The patient has known bilateral iliac artery occlusive disease. It is difficult to determine if the clot in the iliac artery is related to the stent distally or related to occlusive disease. This is most likely the source of embolization to the popliteal artery. I have recommended urgent thrombolysis in hopes of resolving the clot, and also defining the problem in the iliac artery on the right. My concern is that retrograde thrombectomy with a Fogarty catheter blindly into this diseased iliac artery with the stent may leave a suboptimal result. If there is disease amenable to angioplasty in the iliac, this could potentially be addressed on a follow up study after thrombolysis. Her creatinine is normal. I have discussed the case both with Dr. Everitt Amber, who also favors thrombolysis, and Dr. Lenox Ahr who was willing to  proceed tonight. We will continue her heparin for now. She is on aspirin and also on a statin. We have also discussed the importance of tobacco cessation. I will make further recommendations pending the results of her arteriogram.  DICKSON,CHRISTOPHER S [facility] [place]: [phone]

## 2014-05-26 NOTE — ED Notes (Signed)
Patient transported to CT 

## 2014-05-26 NOTE — ED Notes (Signed)
Report to [facility] RN at [facility] ED. Pt was transported via EMS due to long wait time for [facility]

## 2014-05-26 NOTE — ED Provider Notes (Signed)
CSN: [id number]     Arrival date & time 05/26/14  1804 History  This chart was scribed for Pamella Pert, MD by Mercy Moore, ED scribe.  This patient was seen in room MH12/MH12 and the patient's care was started at 6:17 PM.   Chief Complaint  Patient presents with  . Leg Pain   Patient is a 80 y.o. female presenting with leg pain. The history is provided by the patient. No language interpreter was used.  Leg Pain Location:  Leg and foot Time since incident:  2 hours Injury: no   Leg location:  R lower leg Foot location:  R foot Pain details:    Quality:  Aching and tingling   Radiates to:  Does not radiate   Severity:  Moderate   Onset quality:  Sudden   Duration:  2 hours Chronicity:  New Foreign body present:  No foreign bodies Worsened by:  Nothing tried Ineffective treatments:  None tried Associated symptoms: numbness and tingling   Associated symptoms: no back pain, no decreased ROM, no fatigue, no fever, no muscle weakness, no neck pain and no swelling   Risk factors: no recent illness    HPI Comments: Madison Richards is a 80 y.o. female with PMHx of Hypertension, Hyperlipidemia, and osteoporosis who presents to the Emergency Department complaining of sudden aching right lower leg pain, onset two hours ago when walking across her living room floor. Patient reports initial presentation of aching pain in her right leg starting at her right knee extending to her foot and then experiencing tingling pain and numbness in her right foot. Patient states that she maintains movement and strength in the leg. Patient states that currently is experiencing severe pain in popliteal fossa of right knee. Patient denies additional medical complaints.  Patient reports iliac artery stint placement by Dr. Einar Gip in 2003 and 2014.   Past Medical History  Diagnosis Date  . Hypertension   . Hyperlipidemia   . Osteoporosis   . GERD (gastroesophageal reflux disease)   . Neck pain   . Generalized  headaches     due to neck pain   Past Surgical History  Procedure Laterality Date  . Abdominal tumor      removed in 1968/benign  . Iliac artery stent      right side 2003 and 2014  . Appendectomy    . Abdominal angiogram Bilateral 03/02/2012    Procedure: ABDOMINAL ANGIOGRAM;  Surgeon: Laverda Page, MD;  Location: [facility] CATH LAB;  Service: Cardiovascular;  Laterality: Bilateral;  . Abdominal angiogram  04/06/2012    Procedure: ABDOMINAL ANGIOGRAM;  Surgeon: Laverda Page, MD;  Location: [facility] CATH LAB;  Service: Cardiovascular;;   Family History  Problem Relation Age of Onset  . Heart disease Sister   . Heart disease Brother   . Heart disease Brother   . Colon cancer Neg Hx    History  Substance Use Topics  . Smoking status: Current Some Day Smoker  . Smokeless tobacco: Never Used  . Alcohol Use: 4.2 oz/week    7 Glasses of wine per week   OB History    No data available     Review of Systems  Constitutional: Negative for fever and fatigue.  HENT: Negative for congestion, drooling and sore throat.   Eyes: Negative for pain.  Respiratory: Negative for cough, choking, shortness of breath and wheezing.   Cardiovascular: Negative for chest pain.  Gastrointestinal: Negative for nausea, vomiting, abdominal pain and diarrhea.  Genitourinary: Negative for dysuria and hematuria.  Musculoskeletal: Negative for back pain, joint swelling, gait problem and neck pain.  Skin: Negative for color change and rash.  Allergic/Immunologic: Negative for immunocompromised state.  Neurological: Positive for numbness. Negative for dizziness, weakness and headaches.  Hematological: Negative for adenopathy.  Psychiatric/Behavioral: Negative for behavioral problems.  All other systems reviewed and are negative.     Allergies  Clindamycin/lincomycin; Neosporin; and Penicillins  Home Medications   Prior to Admission medications   Medication Sig Start Date End Date Taking? Authorizing  Provider  Amlodipine-Valsartan-HCTZ (EXFORGE HCT) 5-160-12.5 MG TABS Take 1 tablet by mouth daily.   Yes Historical Provider, MD  aspirin EC 81 MG tablet Take 81 mg by mouth daily.   Yes Historical Provider, MD  Calcium Carbonate-Vitamin D (CALCIUM 600 + D PO) Take 1 tablet by mouth 2 (two) times daily.   Yes Historical Provider, MD  celecoxib (CELEBREX) 200 MG capsule Take 200 mg by mouth daily as needed. For pain   Yes Historical Provider, MD  Cholecalciferol (VITAMIN D3) 5000 UNITS TABS Take 1 tablet by mouth every Monday, Wednesday, and Friday.   Yes Historical Provider, MD  colesevelam (WELCHOL) 625 MG tablet Take 625 mg by mouth 2 (two) times daily with a meal.    Yes Historical Provider, MD  ezetimibe (ZETIA) 10 MG tablet Take 10 mg by mouth daily.   Yes Historical Provider, MD  metoprolol tartrate (LOPRESSOR) 25 MG tablet Take 25 mg by mouth 2 (two) times daily.   Yes Historical Provider, MD  Multiple Vitamin (MULTIVITAMIN WITH MINERALS) TABS Take 1 tablet by mouth daily.   Yes Historical Provider, MD  Pitavastatin Calcium (LIVALO) 2 MG TABS Take 1 tablet by mouth every Monday, Wednesday, and Friday.    Yes Historical Provider, MD  fluticasone (FLONASE) 50 MCG/ACT nasal spray Place 2 sprays into the nose daily.    Historical Provider, MD  loratadine (CLARITIN) 10 MG tablet Take 10 mg by mouth as needed.     Historical Provider, MD   Triage Vitals: BP 187/68 mmHg  Pulse 82  Temp(Src) 98.1 F (36.7 C) (Oral)  Resp 18  Ht 5\' 4"  (1.626 m)  Wt 117 lb (53.071 kg)  BMI 20.07 kg/m2  SpO2 95% Physical Exam  Constitutional: She is oriented to person, place, and time. She appears well-developed and well-nourished. No distress.  HENT:  Head: Normocephalic and atraumatic.  Mouth/Throat: Oropharynx is clear and moist. No oropharyngeal exudate.  Eyes: Conjunctivae and EOM are normal. Pupils are equal, round, and reactive to light. Right eye exhibits no discharge. Left eye exhibits no discharge.   Neck: Neck supple. No tracheal deviation present.  Cardiovascular: Normal rate, regular rhythm, normal heart sounds and intact distal pulses.  Exam reveals no gallop and no friction rub.   No murmur heard. Pulmonary/Chest: Effort normal and breath sounds normal. No respiratory distress. She has no wheezes. She has no rales. She exhibits no tenderness.  Abdominal: Soft. Bowel sounds are normal. She exhibits no distension and no mass. There is no tenderness. There is no rebound and no guarding.  Musculoskeletal: Normal range of motion. She exhibits no edema or tenderness.  Pale appearing right lower extremity from knee down.  Unable to palpate popliteal or distal pulses in right lower extremity. unable to doppler DP or PT pulses in RLE.   Delayed capillary refill in the distal right lower extremity.  Palpable right femoral pulse.  Pulses in the left lower extremity are intact.  2+  distal pulses in the upper extremity.  The patient appears to have normal strength and sensation in the right lower extremity currently.  Neurological: She is alert and oriented to person, place, and time.  Skin: Skin is warm and dry. No rash noted. No erythema.  Psychiatric: She has a normal mood and affect. Her behavior is normal.  Nursing note and vitals reviewed.   ED Course  Procedures (including critical care time)  COORDINATION OF CARE: 6:24 PM- Doppler study performed with no strong pulses found. Plans to consult Dr. Einar Gip and order imaging in the meantime. Discussed treatment plan with patient at bedside and patient agreed to plan.   Labs Review Labs Reviewed  CBC WITH DIFFERENTIAL/PLATELET - Abnormal; Notable for the following:    WBC 12.2 (*)    Hemoglobin 15.9 (*)    HCT 46.1 (*)    Neutro Abs 8.3 (*)    Monocytes Absolute 1.1 (*)    All other components within normal limits  COMPREHENSIVE METABOLIC PANEL - Abnormal; Notable for the following:    Potassium 3.3 (*)    Glucose, Bld 190  (*)    BUN 32 (*)    Total Bilirubin 0.2 (*)    GFR calc non Af Amer 69 (*)    GFR calc Af Amer 80 (*)    All other components within normal limits  HEPARIN LEVEL (UNFRACTIONATED) - Abnormal; Notable for the following:    Heparin Unfractionated 0.89 (*)    All other components within normal limits  CBC - Abnormal; Notable for the following:    WBC 14.5 (*)    All other components within normal limits  CBC - Abnormal; Notable for the following:    WBC 12.1 (*)    RBC 3.78 (*)    All other components within normal limits  COMPREHENSIVE METABOLIC PANEL - Abnormal; Notable for the following:    Glucose, Bld 127 (*)    GFR calc non Af Amer 82 (*)    Anion gap 3 (*)    All other components within normal limits  URINALYSIS, ROUTINE W REFLEX MICROSCOPIC - Abnormal; Notable for the following:    Hgb urine dipstick MODERATE (*)    Ketones, ur 15 (*)    All other components within normal limits  I-STAT CG4 LACTIC ACID, ED - Abnormal; Notable for the following:    Lactic Acid, Venous 2.58 (*)    All other components within normal limits  MRSA PCR SCREENING  PROTIME-INR  FIBRINOGEN  FIBRINOGEN  PROTIME-INR  URINE MICROSCOPIC-ADD ON  LACTIC ACID, PLASMA  CBC  CBC  CBC  FIBRINOGEN  FIBRINOGEN  HEPARIN LEVEL (UNFRACTIONATED)  HEPARIN LEVEL (UNFRACTIONATED)  HEPARIN LEVEL (UNFRACTIONATED)    Imaging Review Ir Angiogram Extremity Right  05/27/2014   CLINICAL DATA:  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 Right leg ischemia.  EXAM: IR INFUSION THROMBOL VENOUS INITIAL (MS); RIGHT EXTREMITY ARTERIOGRAPHY; IR ILIAC ART PTA UNI INITIAL; IR ULTRASOUND GUIDANCE VASC ACCESS LEFT  FLUOROSCOPY TIME:  7 minutes and 42 seconds.  MEDICATIONS AND MEDICAL HISTORY: Versed 1 mg, Fentanyl 50 mcg.  Additional Medications: None.  ANESTHESIA/SEDATION: Moderate sedation time: 32 minutes  CONTRAST:  25 cc Omnipaque 3 on  PROCEDURE: The procedure, risks, benefits, and alternatives were  explained to the patient. Questions regarding the procedure were encouraged and answered. The patient understands and consents to the procedure.  The left groin was prepped with Betadine in a sterile fashion, and a sterile drape was applied covering  the operative field. A sterile gown and sterile gloves were used for the procedure.  Under sonographic guidance, a micropuncture needle was inserted into the left common femoral artery and removed over a 018 wire which was up sized to a Bentson. A 5 French sheath was inserted. A 5 French omni Flush catheter was advanced into the aorta for right iliac angiography.  The catheter was advanced over a Bentson wire into the right iliac artery and right iliac angiography was repeated. The catheter was then gently advanced over the Bentson wire across the area of narrowing in the external iliac artery and removed over a Rosen wire. A 35 cm 6 French sheath was advanced over the bifurcation.  The area of narrowing in the right external iliac artery is just beyond the stent and is a web-like. This has a nonspecific etiology. The web-like narrowing was dilated to 6 mm with a 2 cm 6 mm angioplasty device. Repeat angiography demonstrates patency.  A Kumpe catheter was then advanced over the Rosen to the right femoral artery and right lower extremity runoff was performed. This confirmed abrupt occlusion of the popliteal artery above the knee joint.  The Kumpe the catheter was gently advanced over a glidewire into the tibioperoneal trunk across the occlusion and removed over a long Bentson wire. A 90 cm 20 cm infusion length 5 French multi side-hole catheter was advanced over the Bentson wire to the distal popliteal artery. It was secured in placed and TNK lysis was instituted at 0.5 milligrams/hour.  FINDINGS: Right iliac angiography demonstrates long segment stenting of the right common and external iliac artery. The proximal native common iliac artery is patent. The stent is patent.  There is a web-like area of narrowing in the right external iliac artery, just beyond the stent. This may represent residual thrombus after most of the thrombus embolized distally.  After angioplasty, the web-like area of narrowing was noted to be widely patent.  Right lower extremity runoff demonstrates abrupt occlusion of the right popliteal artery above the knee. Below-the-knee, there is very faint opacification of the anterior tibial artery only. There is very poor runoff to the ankle.  The subsequent series of images demonstrate placement of an infusion catheter across the right popliteal artery thrombus.  COMPLICATIONS: None  IMPRESSION: Diagnostic angiography confirms a focal web-like area of narrowing in the right external iliac artery just beyond the stent which probably represents residual thrombus or the platelet blood at the top of the thrombus. It may represent atherosclerosis or dissection, but this is felt to be less likely given the distal embolus. After angioplasty, this area was noted to be widely patent  Right lower extremity runoff demonstrates abrupt occlusion of the popliteal artery most consistent with embolus. There is very poor runoff to the right ankle as described. An infusion catheter was placed across the thrombus and TNK was instituted at 0.5 milligrams/hour.   Electronically Signed   By: Marybelle Killings M.D.   On: 05/27/2014 10:54   Ct Angio Ao+bifem W/cm &/or Wo/cm  05/26/2014   CLINICAL DATA:  Acute right lower extremity pain.  EXAM: CT ANGIOGRAPHY OF ABDOMINAL AORTA WITH ILIOFEMORAL RUNOFF  TECHNIQUE: Multidetector CT imaging of the abdomen, pelvis and lower extremities was performed using the standard protocol during bolus administration of intravenous contrast. Multiplanar CT image reconstructions and MIPs were obtained to evaluate the vascular anatomy.  CONTRAST:  123mL OMNIPAQUE IOHEXOL 350 MG/ML SOLN  COMPARISON:  CT scan of March 10, 2012.  FINDINGS: Aorta:  Atherosclerotic  calcifications of abdominal aorta are noted without aneurysm formation. Mesenteric and renal arteries are widely patent without significant stenosis. Atherosclerotic calcifications are noted throughout the iliac arteries. Stent is noted in the right common and proximal external iliac arteries which is patent. However, focal filling defect is seen just distal to the stent in the distal right external iliac artery consistent with small thrombus.  Severe focal stenosis is noted at the origin of the left common iliac artery secondary to heavily calcified plaque. Another severe stenosis is noted in the distal portion of the left common iliac artery. The left external iliac artery demonstrates significant atheromatous disease resulting in diffusely diminished caliber.  Right Lower Extremity: Moderate atheromatous disease is noted in the right common femoral artery resulting in moderate stenosis. Profunda femoral artery is patent. Atheromatous disease is noted throughout the right superficial femoral artery resulting in the multifocal stenoses. There is complete occlusion of the popliteal artery above the knee joint consistent with acute thrombosis or embolus. There appears to be some reconstitution of the proximal portion of the anterior and posterior tibial arteries from collateral circulation.  Left Lower Extremity: Moderate eccentric calcified plaque is noted in the left common femoral artery. Profunda femoral artery is patent. Left superficial femoral artery appears to be patent with mild stenoses seen in its distal portion. Left popliteal artery is widely patent with all 3 trifurcation vessels patent and flowing toward the ankle mortise.  Review of the MIP images confirms the above findings.  Stable left adrenal mass is noted.  IMPRESSION: Calcified atheromatous disease is noted in the abdominal aorta without significant stenosis, dissection or aneurysm formation.  Heavily calcified plaque is noted at the origin of  the left common iliac artery resulting in severe focal stenosis. Heavily calcified plaque is noted throughout the left external iliac artery and common femoral artery. No other significant stenosis is seen more distally in the left lower extremity.  Stent is noted in the right common and proximal external iliac arteries which is mostly patent. However, new filling defect is seen in the distal right external iliac artery just distal to the stent consistent with acute thrombus.  Moderate atheromatous disease is seen involving the right common femoral artery. Atheromatous disease is seen throughout the right superficial femoral artery resulting in multifocal stenoses.  Abrupt occlusion of the right popliteal artery is seen above the knee joint consistent with acute thrombosis or possibly embolus. There may be some reconstitution of the proximal portions of the anterior and posterior tibial arteries due to collateral circulation, but opacification of these vessels is significantly diminished compared to their counterparts on the left suggesting significantly diminished flow.  Critical Value/emergent results were called by telephone at the time of interpretation on 05/26/2014 at 9:00 pm to Dr. Pamella Pert , who verbally acknowledged these results.   Electronically Signed   By: Marijo Conception, M.D.   On: 05/26/2014 21:08   Ir Iliac Art Pta Uni Initial Mod Sed  05/27/2014   CLINICAL DATA:  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 Right leg ischemia.  EXAM: IR INFUSION THROMBOL VENOUS INITIAL (MS); RIGHT EXTREMITY ARTERIOGRAPHY; IR ILIAC ART PTA UNI INITIAL; IR ULTRASOUND GUIDANCE VASC ACCESS LEFT  FLUOROSCOPY TIME:  7 minutes and 42 seconds.  MEDICATIONS AND MEDICAL HISTORY: Versed 1 mg, Fentanyl 50 mcg.  Additional Medications: None.  ANESTHESIA/SEDATION: Moderate sedation time: 32 minutes  CONTRAST:  25 cc Omnipaque 3 on  PROCEDURE: The procedure, risks, benefits, and alternatives were  explained to the patient. Questions regarding the procedure were encouraged and answered. The patient understands and consents to the procedure.  The left groin was prepped with Betadine in a sterile fashion, and a sterile drape was applied covering the operative field. A sterile gown and sterile gloves were used for the procedure.  Under sonographic guidance, a micropuncture needle was inserted into the left common femoral artery and removed over a 018 wire which was up sized to a Bentson. A 5 French sheath was inserted. A 5 French omni Flush catheter was advanced into the aorta for right iliac angiography.  The catheter was advanced over a Bentson wire into the right iliac artery and right iliac angiography was repeated. The catheter was then gently advanced over the Bentson wire across the area of narrowing in the external iliac artery and removed over a Rosen wire. A 35 cm 6 French sheath was advanced over the bifurcation.  The area of narrowing in the right external iliac artery is just beyond the stent and is a web-like. This has a nonspecific etiology. The web-like narrowing was dilated to 6 mm with a 2 cm 6 mm angioplasty device. Repeat angiography demonstrates patency.  A Kumpe catheter was then advanced over the Rosen to the right femoral artery and right lower extremity runoff was performed. This confirmed abrupt occlusion of the popliteal artery above the knee joint.  The Kumpe the catheter was gently advanced over a glidewire into the tibioperoneal trunk across the occlusion and removed over a long Bentson wire. A 90 cm 20 cm infusion length 5 French multi side-hole catheter was advanced over the Bentson wire to the distal popliteal artery. It was secured in placed and TNK lysis was instituted at 0.5 milligrams/hour.  FINDINGS: Right iliac angiography demonstrates long segment stenting of the right common and external iliac artery. The proximal native common iliac artery is patent. The stent is patent.  There is a web-like area of narrowing in the right external iliac artery, just beyond the stent. This may represent residual thrombus after most of the thrombus embolized distally.  After angioplasty, the web-like area of narrowing was noted to be widely patent.  Right lower extremity runoff demonstrates abrupt occlusion of the right popliteal artery above the knee. Below-the-knee, there is very faint opacification of the anterior tibial artery only. There is very poor runoff to the ankle.  The subsequent series of images demonstrate placement of an infusion catheter across the right popliteal artery thrombus.  COMPLICATIONS: None  IMPRESSION: Diagnostic angiography confirms a focal web-like area of narrowing in the right external iliac artery just beyond the stent which probably represents residual thrombus or the platelet blood at the top of the thrombus. It may represent atherosclerosis or dissection, but this is felt to be less likely given the distal embolus. After angioplasty, this area was noted to be widely patent  Right lower extremity runoff demonstrates abrupt occlusion of the popliteal artery most consistent with embolus. There is very poor runoff to the right ankle as described. An infusion catheter was placed across the thrombus and TNK was instituted at 0.5 milligrams/hour.   Electronically Signed   By: Marybelle Killings M.D.   On: 05/27/2014 10:54   Ir US Guide Vasc Access Left  05/27/2014   CLINICAL DATA:  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 Right leg ischemia.  EXAM: IR INFUSION THROMBOL VENOUS INITIAL (MS); RIGHT EXTREMITY ARTERIOGRAPHY; IR ILIAC ART PTA UNI INITIAL; IR ULTRASOUND GUIDANCE VASC ACCESS LEFT  FLUOROSCOPY TIME:  7 minutes and 42 seconds.  MEDICATIONS AND MEDICAL HISTORY: Versed 1 mg, Fentanyl 50 mcg.  Additional Medications: None.  ANESTHESIA/SEDATION: Moderate sedation time: 32 minutes  CONTRAST:  25 cc Omnipaque 3 on  PROCEDURE: The procedure, risks,  benefits, and alternatives were explained to the patient. Questions regarding the procedure were encouraged and answered. The patient understands and consents to the procedure.  The left groin was prepped with Betadine in a sterile fashion, and a sterile drape was applied covering the operative field. A sterile gown and sterile gloves were used for the procedure.  Under sonographic guidance, a micropuncture needle was inserted into the left common femoral artery and removed over a 018 wire which was up sized to a Bentson. A 5 French sheath was inserted. A 5 French omni Flush catheter was advanced into the aorta for right iliac angiography.  The catheter was advanced over a Bentson wire into the right iliac artery and right iliac angiography was repeated. The catheter was then gently advanced over the Bentson wire across the area of narrowing in the external iliac artery and removed over a Rosen wire. A 35 cm 6 French sheath was advanced over the bifurcation.  The area of narrowing in the right external iliac artery is just beyond the stent and is a web-like. This has a nonspecific etiology. The web-like narrowing was dilated to 6 mm with a 2 cm 6 mm angioplasty device. Repeat angiography demonstrates patency.  A Kumpe catheter was then advanced over the Rosen to the right femoral artery and right lower extremity runoff was performed. This confirmed abrupt occlusion of the popliteal artery above the knee joint.  The Kumpe the catheter was gently advanced over a glidewire into the tibioperoneal trunk across the occlusion and removed over a long Bentson wire. A 90 cm 20 cm infusion length 5 French multi side-hole catheter was advanced over the Bentson wire to the distal popliteal artery. It was secured in placed and TNK lysis was instituted at 0.5 milligrams/hour.  FINDINGS: Right iliac angiography demonstrates long segment stenting of the right common and external iliac artery. The proximal native common iliac artery  is patent. The stent is patent. There is a web-like area of narrowing in the right external iliac artery, just beyond the stent. This may represent residual thrombus after most of the thrombus embolized distally.  After angioplasty, the web-like area of narrowing was noted to be widely patent.  Right lower extremity runoff demonstrates abrupt occlusion of the right popliteal artery above the knee. Below-the-knee, there is very faint opacification of the anterior tibial artery only. There is very poor runoff to the ankle.  The subsequent series of images demonstrate placement of an infusion catheter across the right popliteal artery thrombus.  COMPLICATIONS: None  IMPRESSION: Diagnostic angiography confirms a focal web-like area of narrowing in the right external iliac artery just beyond the stent which probably represents residual thrombus or the platelet blood at the top of the thrombus. It may represent atherosclerosis or dissection, but this is felt to be less likely given the distal embolus. After angioplasty, this area was noted to be widely patent  Right lower extremity runoff demonstrates abrupt occlusion of the popliteal artery most consistent with embolus. There is very poor runoff to the right ankle as described. An infusion catheter was placed across the thrombus and TNK was instituted at 0.5 milligrams/hour.   Electronically Signed   By: Marybelle Killings M.D.   On: 05/27/2014 10:54  Ir Infusion Thrombol Venous Initial (ms)  05/27/2014   CLINICAL DATA:  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 Right leg ischemia.  EXAM: IR INFUSION THROMBOL VENOUS INITIAL (MS); RIGHT EXTREMITY ARTERIOGRAPHY; IR ILIAC ART PTA UNI INITIAL; IR ULTRASOUND GUIDANCE VASC ACCESS LEFT  FLUOROSCOPY TIME:  7 minutes and 42 seconds.  MEDICATIONS AND MEDICAL HISTORY: Versed 1 mg, Fentanyl 50 mcg.  Additional Medications: None.  ANESTHESIA/SEDATION: Moderate sedation time: 32 minutes  CONTRAST:  25 cc Omnipaque  3 on  PROCEDURE: The procedure, risks, benefits, and alternatives were explained to the patient. Questions regarding the procedure were encouraged and answered. The patient understands and consents to the procedure.  The left groin was prepped with Betadine in a sterile fashion, and a sterile drape was applied covering the operative field. A sterile gown and sterile gloves were used for the procedure.  Under sonographic guidance, a micropuncture needle was inserted into the left common femoral artery and removed over a 018 wire which was up sized to a Bentson. A 5 French sheath was inserted. A 5 French omni Flush catheter was advanced into the aorta for right iliac angiography.  The catheter was advanced over a Bentson wire into the right iliac artery and right iliac angiography was repeated. The catheter was then gently advanced over the Bentson wire across the area of narrowing in the external iliac artery and removed over a Rosen wire. A 35 cm 6 French sheath was advanced over the bifurcation.  The area of narrowing in the right external iliac artery is just beyond the stent and is a web-like. This has a nonspecific etiology. The web-like narrowing was dilated to 6 mm with a 2 cm 6 mm angioplasty device. Repeat angiography demonstrates patency.  A Kumpe catheter was then advanced over the Rosen to the right femoral artery and right lower extremity runoff was performed. This confirmed abrupt occlusion of the popliteal artery above the knee joint.  The Kumpe the catheter was gently advanced over a glidewire into the tibioperoneal trunk across the occlusion and removed over a long Bentson wire. A 90 cm 20 cm infusion length 5 French multi side-hole catheter was advanced over the Bentson wire to the distal popliteal artery. It was secured in placed and TNK lysis was instituted at 0.5 milligrams/hour.  FINDINGS: Right iliac angiography demonstrates long segment stenting of the right common and external iliac artery.  The proximal native common iliac artery is patent. The stent is patent. There is a web-like area of narrowing in the right external iliac artery, just beyond the stent. This may represent residual thrombus after most of the thrombus embolized distally.  After angioplasty, the web-like area of narrowing was noted to be widely patent.  Right lower extremity runoff demonstrates abrupt occlusion of the right popliteal artery above the knee. Below-the-knee, there is very faint opacification of the anterior tibial artery only. There is very poor runoff to the ankle.  The subsequent series of images demonstrate placement of an infusion catheter across the right popliteal artery thrombus.  COMPLICATIONS: None  IMPRESSION: Diagnostic angiography confirms a focal web-like area of narrowing in the right external iliac artery just beyond the stent which probably represents residual thrombus or the platelet blood at the top of the thrombus. It may represent atherosclerosis or dissection, but this is felt to be less likely given the distal embolus. After angioplasty, this area was noted to be widely patent  Right lower extremity runoff demonstrates abrupt occlusion of the popliteal  artery most consistent with embolus. There is very poor runoff to the right ankle as described. An infusion catheter was placed across the thrombus and TNK was instituted at 0.5 milligrams/hour.   Electronically Signed   By: Marybelle Killings M.D.   On: 05/27/2014 10:54     EKG Interpretation   Date/Time:  Friday May 26 2014 18:41:07 EDT Ventricular Rate:  81 PR Interval:  172 QRS Duration: 68 QT Interval:  396 QTC Calculation: 460 R Axis:   -14 Text Interpretation:  Normal sinus rhythm Confirmed by Noela Brothers  MD,  Aryel Edelen ([id number]) on 05/26/2014 6:42:36 PM     CRITICAL CARE Performed by: Pamella Pert, S Total critical care time: 60 min Critical care time was exclusive of separately billable procedures and treating other  patients. Critical care was necessary to treat or prevent imminent or life-threatening deterioration. Critical care was time spent personally by me on the following activities: development of treatment plan with patient and/or surrogate as well as nursing, discussions with consultants, evaluation of patient's response to treatment, examination of patient, obtaining history from patient or surrogate, ordering and performing treatments and interventions, ordering and review of laboratory studies, ordering and review of radiographic studies, pulse oximetry and re-evaluation of patient's condition.  MDM   Final diagnoses:  Absent pedal pulses  Lower limb ischemia    6:37 PM 80 y.o. 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 Everitt Amber, AAA, HTN, HLP who presents with sudden onset pain in the right lower extremity from the knee down which occurred around 4:30 PM this evening while walking in her home. She states that she has had aching since that time and also developed some paresthesias in the right lower extremity. She has no palpable distal pulses in the right lower extremity on my exam and I am unable to Doppler a DP or PT pulse in the right lower extremity. I am unable to palpate a popliteal pulse in the right lower extremity as well. She does have normal pulses in the left lower extremity and a normal right femoral pulse. She does have a pale looking right lower extremity from the knee down with some delayed capillary refill. She denies any other complaints. Strong suspicion for acute arterial thrombosis. Will consult Dr. Everitt Amber, get CTA, and start heparin. Fentanyl for pain.   6:53 PM Case discussed w/ Dr. Everitt Amber. Discussed interventions. Will get CTA. We do not have angiomax at [facility], heparin ok. Will go ahead and heparinize.   Discussed results of CTA w/ Dr. Everitt Amber who recommended I speak w/ IR physician. Case discussed w/ Dr. Barbie Banner who recommended I  speak w/ Vascular.   8:56 PM Discussed w/ Dr. Scot Dock (vasc), he will see her at [facility]. Notified Dr. Everitt Amber of plan. Pt remains stable. Will emergently transfer for lysis/thrombectomy. Notified [place] physician of pt's transfer.   The patient required multiple consultations, arranging transfer, repeat evaluations.      I personally performed the services described in this documentation, which was scribed in my presence. The recorded information has been reviewed and is accurate.    Pamella Pert, MD 05/27/14 1145

## 2014-05-26 NOTE — ED Notes (Signed)
Pt c/o pain in her right lower leg from the knee down to her foot and sts that her right foot is pale.

## 2014-05-26 NOTE — ED Notes (Signed)
Dr. Scot Dock to bedside.

## 2014-05-26 NOTE — ED Notes (Signed)
Dr. Scot Dock paged to [phone]

## 2014-05-26 NOTE — ED Notes (Signed)
Lactic Acid Critical Value reported to Dr. Aline Brochure.

## 2014-05-26 NOTE — ED Notes (Signed)
Pt is going to [facility] ED

## 2014-05-26 NOTE — ED Notes (Signed)
Pt feels like the color of her leg is improving. Toes look more cyanotic in my opinion. Her lower leg is less pale and cool but warmer than on arrival. She was able to move from the bed to the bedside commode with little assistance.

## 2014-05-26 NOTE — Progress Notes (Signed)
ANTICOAGULATION CONSULT NOTE - Initial Consult  Pharmacy Consult for Heparin Indication: DVT  Allergies  Allergen Reactions  . Clindamycin/Lincomycin Rash    Rash in mouth  . Neosporin [Neomycin-Polymyxin-Gramicidin] Rash  . Penicillins Rash    Rash in mouth    Patient Measurements: Height: 5\' 4"  (162.6 cm) Weight: 117 lb (53.071 kg) IBW/kg (Calculated) : 54.7  Vital Signs: Temp: 98.1 F (36.7 C) (04/08 1808) Temp Source: Oral (04/08 1808) BP: 187/68 mmHg (04/08 1808) Pulse Rate: 82 (04/08 1808)  Labs:  Recent Labs  05/26/14 1840  HGB 15.9*  HCT 46.1*  PLT 279    CrCl cannot be calculated (Patient has no serum creatinine result on file.).   Medical History: Past Medical History  Diagnosis Date  . Hypertension   . Hyperlipidemia   . Osteoporosis   . GERD (gastroesophageal reflux disease)   . Neck pain   . Generalized headaches     due to neck pain    Medications:   (Not in a hospital admission)  Assessment: 80 yo F admitted 05/26/2014  with pain in right lower leg from knee down, cold.  Pharmacy consulted to dose heparin.  Coag: VTE, no bleeding or recent surgery, CBC in process  Goal of Therapy:  INR 2-3 Monitor platelets by anticoagulation protocol: Yes   Plan:  Heparin 3000 units IV x 1 then 950 units/hr Xa level 6h after ggt starts Daily Xa level and CBC  Thank you for allowing pharmacy to be a part of this patients care team.  Rowe Robert Pharm.D., BCPS, AQ-Cardiology Clinical Pharmacist 05/26/2014 6:58 PM Pager: [phone] Phone: [phone]

## 2014-05-27 ENCOUNTER — Inpatient Hospital Stay (HOSPITAL_COMMUNITY): Payer: Medicare Other

## 2014-05-27 LAB — CBC
HCT: 41 % (ref 36.0–46.0)
HCT: 36.8 % (ref 36.0–46.0)
HCT: 36.5 % (ref 36.0–46.0)
HEMATOCRIT: 36.4 % (ref 36.0–46.0)
HEMOGLOBIN: 12.7 g/dL (ref 12.0–15.0)
Hemoglobin: 14 g/dL (ref 12.0–15.0)
Hemoglobin: 12.4 g/dL (ref 12.0–15.0)
Hemoglobin: 12.3 g/dL (ref 12.0–15.0)
MCH: 33.3 pg (ref 26.0–34.0)
MCH: 33.6 pg (ref 26.0–34.0)
MCH: 33.7 pg (ref 26.0–34.0)
MCH: 33.4 pg (ref 26.0–34.0)
MCHC: 34.1 g/dL (ref 30.0–36.0)
MCHC: 34.5 g/dL (ref 30.0–36.0)
MCHC: 34.1 g/dL (ref 30.0–36.0)
MCHC: 33.7 g/dL (ref 30.0–36.0)
MCV: 97.4 fL (ref 78.0–100.0)
MCV: 97.4 fL (ref 78.0–100.0)
MCV: 98.9 fL (ref 78.0–100.0)
MCV: 99.2 fL (ref 78.0–100.0)
PLATELETS: 216 10*3/uL (ref 150–400)
PLATELETS: 192 10*3/uL (ref 150–400)
PLATELETS: 188 10*3/uL (ref 150–400)
Platelets: 241 10*3/uL (ref 150–400)
RBC: 4.21 MIL/uL (ref 3.87–5.11)
RBC: 3.78 MIL/uL — ABNORMAL LOW (ref 3.87–5.11)
RBC: 3.68 MIL/uL — ABNORMAL LOW (ref 3.87–5.11)
RBC: 3.68 MIL/uL — ABNORMAL LOW (ref 3.87–5.11)
RDW: 13.7 % (ref 11.5–15.5)
RDW: 13.8 % (ref 11.5–15.5)
RDW: 14 % (ref 11.5–15.5)
RDW: 14.1 % (ref 11.5–15.5)
WBC: 14.5 10*3/uL — ABNORMAL HIGH (ref 4.0–10.5)
WBC: 12.1 10*3/uL — ABNORMAL HIGH (ref 4.0–10.5)
WBC: 11 10*3/uL — ABNORMAL HIGH (ref 4.0–10.5)
WBC: 12.3 10*3/uL — ABNORMAL HIGH (ref 4.0–10.5)

## 2014-05-27 LAB — COMPREHENSIVE METABOLIC PANEL
ALBUMIN: 3.5 g/dL (ref 3.5–5.2)
ALK PHOS: 67 U/L (ref 39–117)
ALT: 15 U/L (ref 0–35)
AST: 25 U/L (ref 0–37)
Anion gap: 3 — ABNORMAL LOW (ref 5–15)
BUN: 23 mg/dL (ref 6–23)
CALCIUM: 8.7 mg/dL (ref 8.4–10.5)
CHLORIDE: 109 mmol/L (ref 96–112)
CO2: 27 mmol/L (ref 19–32)
Creatinine, Ser: 0.68 mg/dL (ref 0.50–1.10)
GFR, EST NON AFRICAN AMERICAN: 82 mL/min — AB (ref >90)
Glucose, Bld: 127 mg/dL — ABNORMAL HIGH (ref 70–99)
POTASSIUM: 3.8 mmol/L (ref 3.5–5.1)
Sodium: 139 mmol/L (ref 135–145)
Total Bilirubin: 0.7 mg/dL (ref 0.3–1.2)
Total Protein: 6.2 g/dL (ref 6.0–8.3)

## 2014-05-27 LAB — URINALYSIS, ROUTINE W REFLEX MICROSCOPIC
BILIRUBIN URINE: NEGATIVE
Glucose, UA: NEGATIVE mg/dL
KETONES UR: 15 mg/dL — AB
Leukocytes, UA: NEGATIVE
Nitrite: NEGATIVE
Protein, ur: NEGATIVE mg/dL
Specific Gravity, Urine: 1.015 (ref 1.005–1.030)
Urobilinogen, UA: 0.2 mg/dL (ref 0.0–1.0)
pH: 6 (ref 5.0–8.0)

## 2014-05-27 LAB — MRSA PCR SCREENING: MRSA BY PCR: NEGATIVE

## 2014-05-27 LAB — PROTIME-INR
INR: 1.14 (ref 0.00–1.49)
PROTHROMBIN TIME: 14.8 s (ref 11.6–15.2)

## 2014-05-27 LAB — FIBRINOGEN
Fibrinogen: 291 mg/dL (ref 204–475)
Fibrinogen: 261 mg/dL (ref 204–475)
Fibrinogen: 247 mg/dL (ref 204–475)
Fibrinogen: 259 mg/dL (ref 204–475)

## 2014-05-27 LAB — HEPARIN LEVEL (UNFRACTIONATED)
HEPARIN UNFRACTIONATED: 0.28 [IU]/mL — AB (ref 0.30–0.70)
Heparin Unfractionated: 0.89 IU/mL — ABNORMAL HIGH (ref 0.30–0.70)

## 2014-05-27 LAB — URINE MICROSCOPIC-ADD ON

## 2014-05-27 MED ORDER — HYDRALAZINE HCL 20 MG/ML IJ SOLN: 5.0000 mg | INTRAMUSCULAR | Status: DC | PRN

## 2014-05-27 MED ORDER — SODIUM CHLORIDE 0.9 % IV SOLN
INTRAVENOUS | Status: AC | PRN
  Administered 2014-05-27: 10 mL/h via INTRAVENOUS

## 2014-05-27 MED ORDER — AMLODIPINE-VALSARTAN-HCTZ 5-160-12.5 MG PO TABS: 1.0000 | ORAL_TABLET | Freq: Every day | ORAL | Status: DC

## 2014-05-27 MED ORDER — CETYLPYRIDINIUM CHLORIDE 0.05 % MT LIQD
7.0000 mL | Freq: Two times a day (BID) | OROMUCOSAL | Status: DC
Start: 2014-05-27 — End: 2014-05-28
  Administered 2014-05-27 – 2014-05-28 (×3): 7 mL via OROMUCOSAL

## 2014-05-27 MED ORDER — HEPARIN (PORCINE) IN NACL 100-0.45 UNIT/ML-% IJ SOLN: 800.0000 [IU]/h | INTRAMUSCULAR | Status: DC

## 2014-05-27 MED ORDER — PHENOL 1.4 % MT LIQD: 1.0000 | OROMUCOSAL | Status: DC | PRN

## 2014-05-27 MED ORDER — MORPHINE SULFATE 2 MG/ML IJ SOLN
5.0000 mg | INTRAMUSCULAR | Status: DC | PRN
  Administered 2014-05-27: 5 mg via INTRAVENOUS
  Filled 2014-05-27: qty 3

## 2014-05-27 MED ORDER — SODIUM CHLORIDE 0.9 % IJ SOLN: 3.0000 mL | INTRAMUSCULAR | Status: DC | PRN

## 2014-05-27 MED ORDER — FENTANYL CITRATE 0.05 MG/ML IJ SOLN
INTRAMUSCULAR | Status: AC
  Filled 2014-05-27: qty 2

## 2014-05-27 MED ORDER — IRBESARTAN 150 MG PO TABS
150.0000 mg | ORAL_TABLET | Freq: Every day | ORAL | Status: DC
  Administered 2014-05-27 – 2014-05-29 (×3): 150 mg via ORAL
  Filled 2014-05-27 (×4): qty 1

## 2014-05-27 MED ORDER — HEPARIN BOLUS VIA INFUSION
1500.0000 [IU] | Freq: Once | INTRAVENOUS | Status: AC
  Administered 2014-05-27: 1500 [IU] via INTRAVENOUS
  Filled 2014-05-27: qty 1500

## 2014-05-27 MED ORDER — VITAMIN D 1000 UNITS PO TABS
5000.0000 [IU] | ORAL_TABLET | ORAL | Status: DC
  Administered 2014-05-29: 5000 [IU] via ORAL
  Filled 2014-05-27: qty 5

## 2014-05-27 MED ORDER — HYDROMORPHONE HCL 1 MG/ML IJ SOLN
0.5000 mg | INTRAMUSCULAR | Status: DC | PRN
  Administered 2014-05-27 (×2): 1 mg via INTRAVENOUS
  Filled 2014-05-27 (×2): qty 1

## 2014-05-27 MED ORDER — SODIUM CHLORIDE 0.9 % IV SOLN: 250.0000 mL | INTRAVENOUS | Status: DC | PRN

## 2014-05-27 MED ORDER — ADULT MULTIVITAMIN W/MINERALS CH
1.0000 | ORAL_TABLET | Freq: Every day | ORAL | Status: DC
  Administered 2014-05-27 – 2014-05-29 (×3): 1 via ORAL
  Filled 2014-05-27 (×4): qty 1

## 2014-05-27 MED ORDER — METOPROLOL TARTRATE 25 MG PO TABS
25.0000 mg | ORAL_TABLET | Freq: Two times a day (BID) | ORAL | Status: DC
  Administered 2014-05-27 – 2014-05-29 (×3): 25 mg via ORAL
  Filled 2014-05-27 (×9): qty 1

## 2014-05-27 MED ORDER — ASPIRIN EC 81 MG PO TBEC
81.0000 mg | DELAYED_RELEASE_TABLET | Freq: Every day | ORAL | Status: DC
  Administered 2014-05-27 – 2014-05-29 (×3): 81 mg via ORAL
  Filled 2014-05-27 (×4): qty 1

## 2014-05-27 MED ORDER — EZETIMIBE 10 MG PO TABS
10.0000 mg | ORAL_TABLET | Freq: Every day | ORAL | Status: DC
  Administered 2014-05-27 – 2014-05-29 (×3): 10 mg via ORAL
  Filled 2014-05-27 (×4): qty 1

## 2014-05-27 MED ORDER — PANTOPRAZOLE SODIUM 40 MG PO TBEC
40.0000 mg | DELAYED_RELEASE_TABLET | Freq: Every day | ORAL | Status: DC
  Administered 2014-05-27 – 2014-05-29 (×3): 40 mg via ORAL
  Filled 2014-05-27 (×3): qty 1

## 2014-05-27 MED ORDER — MIDAZOLAM HCL 2 MG/2ML IJ SOLN
INTRAMUSCULAR | Status: AC
  Filled 2014-05-27: qty 2

## 2014-05-27 MED ORDER — HYDROCHLOROTHIAZIDE 12.5 MG PO CAPS
12.5000 mg | ORAL_CAPSULE | Freq: Every day | ORAL | Status: DC
  Administered 2014-05-27 – 2014-05-29 (×3): 12.5 mg via ORAL
  Filled 2014-05-27 (×4): qty 1

## 2014-05-27 MED ORDER — TENECTEPLASE 50 MG IV KIT
0.5000 mg/h | PACK | INTRAVENOUS | Status: DC
  Administered 2014-05-27 – 2014-05-28 (×6): 0.5 mg/h
  Filled 2014-05-27 (×7): qty 0.5

## 2014-05-27 MED ORDER — AMLODIPINE BESYLATE 5 MG PO TABS
5.0000 mg | ORAL_TABLET | Freq: Every day | ORAL | Status: DC
  Administered 2014-05-28 – 2014-05-29 (×2): 5 mg via ORAL
  Filled 2014-05-27 (×4): qty 1

## 2014-05-27 MED ORDER — HEPARIN (PORCINE) IN NACL 100-0.45 UNIT/ML-% IJ SOLN
750.0000 [IU]/h | INTRAMUSCULAR | Status: DC
  Administered 2014-05-27: 700 [IU]/h via INTRAVENOUS
  Administered 2014-05-29: 750 [IU]/h via INTRAVENOUS
  Filled 2014-05-27 (×2): qty 250

## 2014-05-27 MED ORDER — POTASSIUM CHLORIDE CRYS ER 20 MEQ PO TBCR
20.0000 meq | EXTENDED_RELEASE_TABLET | Freq: Once | ORAL | Status: AC
  Administered 2014-05-27: 40 meq via ORAL
  Filled 2014-05-27: qty 2

## 2014-05-27 MED ORDER — SODIUM CHLORIDE 0.9 % IJ SOLN
3.0000 mL | Freq: Two times a day (BID) | INTRAMUSCULAR | Status: DC
  Administered 2014-05-27 – 2014-05-29 (×5): 3 mL via INTRAVENOUS

## 2014-05-27 MED ORDER — MIDAZOLAM HCL 2 MG/2ML IJ SOLN
INTRAMUSCULAR | Status: AC | PRN
  Administered 2014-05-27 (×2): 0.5 mg via INTRAVENOUS

## 2014-05-27 MED ORDER — LABETALOL HCL 5 MG/ML IV SOLN
10.0000 mg | INTRAVENOUS | Status: DC | PRN
  Filled 2014-05-27: qty 4

## 2014-05-27 MED ORDER — GUAIFENESIN-DM 100-10 MG/5ML PO SYRP: 15.0000 mL | ORAL_SOLUTION | ORAL | Status: DC | PRN

## 2014-05-27 MED ORDER — FENTANYL CITRATE 0.05 MG/ML IJ SOLN
INTRAMUSCULAR | Status: AC | PRN
  Administered 2014-05-27 (×2): 25 ug via INTRAVENOUS

## 2014-05-27 MED ORDER — ONDANSETRON HCL 4 MG/2ML IJ SOLN: 4.0000 mg | Freq: Four times a day (QID) | INTRAMUSCULAR | Status: DC | PRN

## 2014-05-27 MED ORDER — MIDAZOLAM HCL 2 MG/2ML IJ SOLN: 1.0000 mg | INTRAMUSCULAR | Status: DC | PRN

## 2014-05-27 MED ORDER — METOPROLOL TARTRATE 1 MG/ML IV SOLN
2.0000 mg | INTRAVENOUS | Status: DC | PRN
Start: 2014-05-27 — End: 2014-05-30
  Administered 2014-05-29: 2.5 mg via INTRAVENOUS
  Filled 2014-05-27: qty 5

## 2014-05-27 MED ORDER — COLESEVELAM HCL 625 MG PO TABS
625.0000 mg | ORAL_TABLET | Freq: Two times a day (BID) | ORAL | Status: DC
  Administered 2014-05-27 – 2014-05-29 (×5): 625 mg via ORAL
  Filled 2014-05-27 (×9): qty 1

## 2014-05-27 MED ORDER — ALUM & MAG HYDROXIDE-SIMETH 200-200-20 MG/5ML PO SUSP: 15.0000 mL | ORAL | Status: DC | PRN

## 2014-05-27 MED ORDER — CELECOXIB 200 MG PO CAPS
200.0000 mg | ORAL_CAPSULE | Freq: Every day | ORAL | Status: DC | PRN
  Filled 2014-05-27: qty 1

## 2014-05-27 MED ORDER — IODIXANOL 320 MG/ML IV SOLN
100.0000 mL | Freq: Once | INTRAVENOUS | Status: AC | PRN
  Administered 2014-05-27: 25 mL via INTRAVENOUS

## 2014-05-27 MED ORDER — LIDOCAINE HCL 1 % IJ SOLN
INTRAMUSCULAR | Status: AC
  Filled 2014-05-27: qty 20

## 2014-05-27 MED ORDER — PRAVASTATIN SODIUM 40 MG PO TABS
40.0000 mg | ORAL_TABLET | Freq: Every day | ORAL | Status: DC
  Administered 2014-05-27 – 2014-05-29 (×3): 40 mg via ORAL
  Filled 2014-05-27 (×4): qty 1

## 2014-05-27 NOTE — Progress Notes (Signed)
   VASCULAR SURGERY ASSESSMENT & PLAN:  * Right foot slightly improved. I can now get a faint ATA signal with the doppler.   * Had successful PTA of stenosis/clot at distal aspect of covered stent in Right CIA and EIA.   * Continues  Heparin and tenecteplase. F/U study today? Now that the proximal source of embolization has been addressed, if thrombosysis is not successful, I could still go after the popliteal embolus surgically. However, hopefully continued thrombolysis will address this clot also.   SUBJECTIVE: Foot feels a little better, but still some right foot pain  PHYSICAL EXAM: Filed Vitals:   05/27/14 0400 05/27/14 0500 05/27/14 0600 05/27/14 0700  BP: 145/53 132/48 126/48 129/47  Pulse: 88 81 78 74  Temp: 98.3 F (36.8 C)     TempSrc: Oral     Resp: 14 12 13 13   Height:      Weight:      SpO2: 96% 95% 95% 96%   Faint R ATA signal with the doppler. Able to move right foot Calf soft Slight paresthesias right foot Left groin cath site ok   LABS: Lab Results  Component Value Date   WBC 14.5* 05/27/2014   HGB 14.0 05/27/2014   HCT 41.0 05/27/2014   MCV 97.4 05/27/2014   PLT 241 05/27/2014   Lab Results  Component Value Date   CREATININE 0.68 05/27/2014   Lab Results  Component Value Date   INR 1.14 05/27/2014    Active Problems:   Embolus and thrombosis of iliac artery   Gae Gallop Beeper: [phone] 05/27/2014

## 2014-05-27 NOTE — ED Notes (Signed)
Dr. Scot Dock paged, stated he would upgrade pt's bed request to ICU.

## 2014-05-27 NOTE — Consult Note (Signed)
Chief Complaint: Chief Complaint  Patient presents with  . Leg Pain    Referring Physician(s): * No referring provider recorded for this case *  History of Present Illness: Madison Richards is a 80 y.o. female with acute right foot ischemia was found to have right pop art embolus. She has a Hx of R iliac artery stent times two, most recently for aneurysm. No palp/dopp pulses in ED. No Hx of cancer/GI bleed/Stroke.   Past Medical History  Diagnosis Date  . Hypertension   . Hyperlipidemia   . Osteoporosis   . GERD (gastroesophageal reflux disease)   . Neck pain   . Generalized headaches     due to neck pain    Past Surgical History  Procedure Laterality Date  . Abdominal tumor      removed in 1968/benign  . Iliac artery stent      right side 2003 and 2014  . Appendectomy    . Abdominal angiogram Bilateral 03/02/2012    Procedure: ABDOMINAL ANGIOGRAM;  Surgeon: Laverda Page, MD;  Location: [facility] CATH LAB;  Service: Cardiovascular;  Laterality: Bilateral;  . Abdominal angiogram  04/06/2012    Procedure: ABDOMINAL ANGIOGRAM;  Surgeon: Laverda Page, MD;  Location: [facility] CATH LAB;  Service: Cardiovascular;;    Allergies: Clindamycin/lincomycin; Neosporin; and Penicillins  Medications: Prior to Admission medications   Medication Sig Start Date End Date Taking? Authorizing Provider  Amlodipine-Valsartan-HCTZ (EXFORGE HCT) 5-160-12.5 MG TABS Take 1 tablet by mouth daily.   Yes Historical Provider, MD  aspirin EC 81 MG tablet Take 81 mg by mouth daily.   Yes Historical Provider, MD  Calcium Carbonate-Vitamin D (CALCIUM 600 + D PO) Take 1 tablet by mouth 2 (two) times daily.   Yes Historical Provider, MD  celecoxib (CELEBREX) 200 MG capsule Take 200 mg by mouth daily as needed. For pain   Yes Historical Provider, MD  Cholecalciferol (VITAMIN D3) 5000 UNITS TABS Take 1 tablet by mouth every Monday, Wednesday, and Friday.   Yes Historical Provider, MD  colesevelam (WELCHOL)  625 MG tablet Take 625 mg by mouth 2 (two) times daily with a meal.    Yes Historical Provider, MD  ezetimibe (ZETIA) 10 MG tablet Take 10 mg by mouth daily.   Yes Historical Provider, MD  fluticasone (FLONASE) 50 MCG/ACT nasal spray Place 2 sprays into the nose daily.    Historical Provider, MD  loratadine (CLARITIN) 10 MG tablet Take 10 mg by mouth as needed.     Historical Provider, MD  metoprolol tartrate (LOPRESSOR) 25 MG tablet Take 25 mg by mouth 2 (two) times daily.   Yes Historical Provider, MD  Multiple Vitamin (MULTIVITAMIN WITH MINERALS) TABS Take 1 tablet by mouth daily.   Yes Historical Provider, MD  Pitavastatin Calcium (LIVALO) 2 MG TABS Take 1 tablet by mouth every Monday, Wednesday, and Friday.    Yes Historical Provider, MD     Family History  Problem Relation Age of Onset  . Heart disease Sister   . Heart disease Brother   . Heart disease Brother   . Colon cancer Neg Hx     History   Social History  . Marital Status: Widowed    Spouse Name: N/A  . Number of Children: N/A  . Years of Education: N/A   Social History Main Topics  . Smoking status: Current Some Day Smoker  . Smokeless tobacco: Never Used  . Alcohol Use: 4.2 oz/week    7 Glasses  of wine per week  . Drug Use: No  . Sexual Activity: Not on file   Other Topics Concern  . None   Social History Narrative     Review of Systems: A 12 point ROS discussed and pertinent positives are indicated in the HPI above.  All other systems are negative.  Review of Systems  Vital Signs: BP 129/47 mmHg  Pulse 86  Temp(Src) 97.4 F (36.3 C) (Oral)  Resp 16  Ht 5\' 4"  (1.626 m)  Wt 117 lb (53.071 kg)  BMI 20.07 kg/m2  SpO2 97%  Physical Exam  Musculoskeletal:  Right foot is warm and pink. No distal pulses.    Mallampati Score:  MD Evaluation Airway: WNL Heart: WNL Abdomen: WNL Chest/ Lungs: WNL ASA  Classification: 2 Mallampati/Airway Score: One  Imaging: Ct Angio Ao+bifem W/cm &/or  Wo/cm  05/26/2014   CLINICAL DATA:  Acute right lower extremity pain.  EXAM: CT ANGIOGRAPHY OF ABDOMINAL AORTA WITH ILIOFEMORAL RUNOFF  TECHNIQUE: Multidetector CT imaging of the abdomen, pelvis and lower extremities was performed using the standard protocol during bolus administration of intravenous contrast. Multiplanar CT image reconstructions and MIPs were obtained to evaluate the vascular anatomy.  CONTRAST:  181mL OMNIPAQUE IOHEXOL 350 MG/ML SOLN  COMPARISON:  CT scan of March 10, 2012.  FINDINGS: Aorta: Atherosclerotic calcifications of abdominal aorta are noted without aneurysm formation. Mesenteric and renal arteries are widely patent without significant stenosis. Atherosclerotic calcifications are noted throughout the iliac arteries. Stent is noted in the right common and proximal external iliac arteries which is patent. However, focal filling defect is seen just distal to the stent in the distal right external iliac artery consistent with small thrombus.  Severe focal stenosis is noted at the origin of the left common iliac artery secondary to heavily calcified plaque. Another severe stenosis is noted in the distal portion of the left common iliac artery. The left external iliac artery demonstrates significant atheromatous disease resulting in diffusely diminished caliber.  Right Lower Extremity: Moderate atheromatous disease is noted in the right common femoral artery resulting in moderate stenosis. Profunda femoral artery is patent. Atheromatous disease is noted throughout the right superficial femoral artery resulting in the multifocal stenoses. There is complete occlusion of the popliteal artery above the knee joint consistent with acute thrombosis or embolus. There appears to be some reconstitution of the proximal portion of the anterior and posterior tibial arteries from collateral circulation.  Left Lower Extremity: Moderate eccentric calcified plaque is noted in the left common femoral artery.  Profunda femoral artery is patent. Left superficial femoral artery appears to be patent with mild stenoses seen in its distal portion. Left popliteal artery is widely patent with all 3 trifurcation vessels patent and flowing toward the ankle mortise.  Review of the MIP images confirms the above findings.  Stable left adrenal mass is noted.  IMPRESSION: Calcified atheromatous disease is noted in the abdominal aorta without significant stenosis, dissection or aneurysm formation.  Heavily calcified plaque is noted at the origin of the left common iliac artery resulting in severe focal stenosis. Heavily calcified plaque is noted throughout the left external iliac artery and common femoral artery. No other significant stenosis is seen more distally in the left lower extremity.  Stent is noted in the right common and proximal external iliac arteries which is mostly patent. However, new filling defect is seen in the distal right external iliac artery just distal to the stent consistent with acute thrombus.  Moderate atheromatous disease is  seen involving the right common femoral artery. Atheromatous disease is seen throughout the right superficial femoral artery resulting in multifocal stenoses.  Abrupt occlusion of the right popliteal artery is seen above the knee joint consistent with acute thrombosis or possibly embolus. There may be some reconstitution of the proximal portions of the anterior and posterior tibial arteries due to collateral circulation, but opacification of these vessels is significantly diminished compared to their counterparts on the left suggesting significantly diminished flow.  Critical Value/emergent results were called by telephone at the time of interpretation on 05/26/2014 at 9:00 pm to Dr. Pamella Pert , who verbally acknowledged these results.   Electronically Signed   By: Marijo Conception, M.D.   On: 05/26/2014 21:08    Labs:  CBC:  Recent Labs  05/26/14 1840  WBC 12.2*  HGB  15.9*  HCT 46.1*  PLT 279    COAGS:  Recent Labs  05/26/14 1840  INR 0.94    BMP:  Recent Labs  05/26/14 1840  NA 139  K 3.3*  CL 104  CO2 24  GLUCOSE 190*  BUN 32*  CALCIUM 10.3  CREATININE 0.80  GFRNONAA 69*  GFRAA 80*    LIVER FUNCTION TESTS:  Recent Labs  05/26/14 1840  BILITOT 0.2*  AST 26  ALT 18  ALKPHOS 78  PROT 7.4  ALBUMIN 4.2    TUMOR MARKERS: No results for input(s): AFPTM, CEA, CA199, CHROMGRNA in the last 8760 hours.  Assessment and Plan:  Right foot ischemia secondary to pop art embolus. RLE lysis to follow.  Thank you for this interesting consult.  I greatly enjoyed meeting KASSADIE PANCAKE and look forward to participating in their care.  Signed: Sonyia Muro, ART A 05/27/2014, 1:35 AM   I spent a total of 40 Minutes  in face to face in clinical consultation, greater than 50% of which was counseling/coordinating care for ischemia.

## 2014-05-27 NOTE — Progress Notes (Addendum)
ANTICOAGULATION CONSULT NOTE - Follow Up Consult  Pharmacy Consult for heparin, also on TNKase Indication: DVT   Assessment: 80 y/o female on IV heparin and TNKase for DVT s/p lysis in IR. Heparin was turned off ~11:00 for repeat arteriogram but patient has not gone yet. Called IR and spoke with Dr. Barbie Banner and received order to resume IV heparin and give 1/2 bolus. Arteriogram likely around 18:00 tonight.  Goal of Therapy:  Heparin level 0.2-0.5 units/ml Monitor platelets by anticoagulation protocol: Yes   Plan:  - Heparin 1500 units IV bolus then resume 700 units/hr - Will f/u after arteriogram and reschedule heparin levels  [facility], Pharm.D., BCPS Clinical Pharmacist Pager: [phone] 05/27/2014 3:13 PM  Addendum: Heparin level is therapeutic at 0.28 on 700 units/hr. No bleeding noted, CBC stable.  Continue heparin drip at 700 units/hr F/U am heparin level  [facility], Pharm.D., BCPS Clinical Pharmacist Pager: [phone] 05/27/2014 10:09 PM     Allergies  Allergen Reactions  . Clindamycin/Lincomycin Rash    Rash in mouth  . Neosporin [Neomycin-Polymyxin-Gramicidin] Rash  . Penicillins Rash    Rash in mouth    Patient Measurements: Height: 5\' 4"  (162.6 cm) Weight: 117 lb (53.071 kg) IBW/kg (Calculated) : 54.7  Vital Signs: Temp: 98.3 F (36.8 C) (04/09 1118) Temp Source: Oral (04/09 1118) BP: 105/39 mmHg (04/09 1100) Pulse Rate: 55 (04/09 1100)  Labs:  Recent Labs  05/26/14 1840 05/27/14 0353 05/27/14 0354 05/27/14 0840  HGB 15.9* 14.0  --  12.7  HCT 46.1* 41.0  --  36.8  PLT 279 241  --  216  LABPROT 12.6 14.8  --   --   INR 0.94 1.14  --   --   HEPARINUNFRC  --   --  0.89*  --   CREATININE 0.80 0.68  --   --     Estimated Creatinine Clearance: 49.4 mL/min (by C-G formula based on Cr of 0.68).   Medications:  Infusions:  . sodium chloride 75 mL/hr at 05/27/14 1300  . heparin Stopped (05/27/14 1100)  . tenecteplase (TNKase) infusion  0.5 mg/hr (05/27/14 1440)

## 2014-05-27 NOTE — Progress Notes (Signed)
80yo female started on heparin in the evening for DVT, now in IR for RLE lysis w/ TNKase.  Per MD, heparin rate to be turned down to 800 units/hr for now and goal to be decreased to 0.2-0.5.  Will f/u w/ heparin levels and CBC.  Wynona Neat, PharmD, BCPS 05/27/2014 2:35 AM

## 2014-05-27 NOTE — ED Notes (Addendum)
Pt going OTF to IR at this time. Mickel Baas, RN to call and update on pt status, states pt will need ICU bed assignment post-procedure.

## 2014-05-27 NOTE — ED Notes (Signed)
Pt O2 sat dropped to 87 after dilaudid, placed on 2 L and sat up to 94.

## 2014-05-27 NOTE — Progress Notes (Signed)
80yo female started on heparin in the evening for DVT.  She is s/p  IR procedure, R Pop Art lysis (RLE lysis), receiving TNKase infusion and IV heparin infusion.  Heparin began prior to IR procedure, heparin infusing at 950 units hr and received 3000 units IV hepari bolus.  Post IR the heparin was decreased to 800 units/hr. Goal HL reduced to 0.2-0.5 with TNKase infusing.   HL now = 0.89 but drawn only 65min post heparin rate decresed to 800/hr.  Likely reflects infusion rate of  950/hr. RN reports heparin was infusing during IR procedure at 950 units/hr.. No bleeding. Has q6h HL ordered, TNKase infusing. Lower goal 0.2-0.5  Plan: Decrease heparin rate to 700/hr, f/u q6h HL next at [place], [place] Pharmacist Pager: [phone] 05/27/2014 4:50 AM

## 2014-05-27 NOTE — Procedures (Signed)
Proc - RLE RO, REIA PTA, R Pop Art lysis 90/20 cm infusion cath across pop art occlusion, TNK 0.5 mg/hr No comp

## 2014-05-28 ENCOUNTER — Inpatient Hospital Stay (HOSPITAL_COMMUNITY): Payer: Medicare Other

## 2014-05-28 LAB — CBC
HCT: 37 % (ref 36.0–46.0)
HEMATOCRIT: 38.6 % (ref 36.0–46.0)
HEMATOCRIT: 40 % (ref 36.0–46.0)
Hemoglobin: 12.3 g/dL (ref 12.0–15.0)
Hemoglobin: 12.9 g/dL (ref 12.0–15.0)
Hemoglobin: 13.5 g/dL (ref 12.0–15.0)
MCH: 32.8 pg (ref 26.0–34.0)
MCH: 33 pg (ref 26.0–34.0)
MCH: 33.4 pg (ref 26.0–34.0)
MCHC: 33.2 g/dL (ref 30.0–36.0)
MCHC: 33.4 g/dL (ref 30.0–36.0)
MCHC: 33.8 g/dL (ref 30.0–36.0)
MCV: 98.7 fL (ref 78.0–100.0)
MCV: 98.7 fL (ref 78.0–100.0)
MCV: 99 fL (ref 78.0–100.0)
PLATELETS: 193 10*3/uL (ref 150–400)
PLATELETS: 184 10*3/uL (ref 150–400)
PLATELETS: 185 10*3/uL (ref 150–400)
RBC: 3.75 MIL/uL — ABNORMAL LOW (ref 3.87–5.11)
RBC: 3.91 MIL/uL (ref 3.87–5.11)
RBC: 4.04 MIL/uL (ref 3.87–5.11)
RDW: 14.2 % (ref 11.5–15.5)
RDW: 14.2 % (ref 11.5–15.5)
RDW: 13.8 % (ref 11.5–15.5)
WBC: 12.5 10*3/uL — ABNORMAL HIGH (ref 4.0–10.5)
WBC: 12 10*3/uL — AB (ref 4.0–10.5)
WBC: 13 10*3/uL — ABNORMAL HIGH (ref 4.0–10.5)

## 2014-05-28 LAB — FIBRINOGEN
Fibrinogen: 295 mg/dL (ref 204–475)
Fibrinogen: 361 mg/dL (ref 204–475)
Fibrinogen: 390 mg/dL (ref 204–475)

## 2014-05-28 LAB — HEPARIN LEVEL (UNFRACTIONATED): Heparin Unfractionated: 0.36 IU/mL (ref 0.30–0.70)

## 2014-05-28 MED ORDER — IODIXANOL 320 MG/ML IV SOLN
100.0000 mL | Freq: Once | INTRAVENOUS | Status: AC | PRN
  Administered 2014-05-28: 30 mL via INTRAVENOUS

## 2014-05-28 MED ORDER — IOHEXOL 300 MG/ML  SOLN: 150.0000 mL | Freq: Once | INTRAMUSCULAR | Status: AC | PRN

## 2014-05-28 MED ORDER — CETYLPYRIDINIUM CHLORIDE 0.05 % MT LIQD: 7.0000 mL | OROMUCOSAL | Status: DC | PRN

## 2014-05-28 NOTE — Progress Notes (Addendum)
   VASCULAR SURGERY ASSESSMENT & PLAN:  * Thrombolysis continues in right lower extremity. She now has brisk Doppler signals in the right dorsalis pedis and posterior tibial positions. There is no significant calf swelling. There is mild calf tenderness.  * Patient scheduled for follow up arteriogram today.    SUBJECTIVE: Right foot feels much better.  PHYSICAL EXAM: Filed Vitals:   05/28/14 0500 05/28/14 0600 05/28/14 0700 05/28/14 0740  BP: 123/45 111/46 126/47   Pulse: 66 54 64   Temp:    98.8 F (37.1 C)  TempSrc:    Oral  Resp: 12 10 11    Height:      Weight:      SpO2: 91% 94% 93%    Brisk Doppler signals in the right dorsalis pedis and posterior tibial positions. No significant right calf swelling. Mild right calf tenderness to palpation. Site left groin looks fine.  LABS: Lab Results  Component Value Date   WBC 12.5* 05/28/2014   HGB 12.3 05/28/2014   HCT 37.0 05/28/2014   MCV 98.7 05/28/2014   PLT 193 05/28/2014   Lab Results  Component Value Date   CREATININE 0.68 05/27/2014   Lab Results  Component Value Date   INR 1.14 05/27/2014   Active Problems:   Embolus and thrombosis of iliac artery  Gae Gallop Beeper: [phone] 05/28/2014  Addendum: follow up study today shows complete resolution of the clot in the right popliteal artery with three-vessel runoff. The sheath will come out later today. She'll be transferred to the floor later and began ambulating tomorrow. She can follow up with Dr. Everitt Amber who placed her covered stent in the right common and external iliac artery.  Deitra Mayo, MD, [place] [phone] Office: [phone]

## 2014-05-28 NOTE — Progress Notes (Signed)
At 1500 a 6 french sheath was removed from the left femoral artery . A Vpad was applied with manual pressure to left groin for 20 minutes. Left anterior and posterior pulse present with doppler. The access site dressed with gauze and Tegaderm.

## 2014-05-28 NOTE — Procedures (Signed)
RLE lysis check  Clinical - Palpable DP and PT pulse at R ankle.  Find - R pop art widely patent with 3 vess RO. THrombus resolved  Comp

## 2014-05-28 NOTE — Progress Notes (Addendum)
ANTICOAGULATION CONSULT NOTE - Follow Up Consult  Pharmacy Consult for heparin Indication: DVT s/p lysis  Allergies  Allergen Reactions  . Clindamycin/Lincomycin Rash    Rash in mouth  . Neosporin [Neomycin-Polymyxin-Gramicidin] Rash  . Penicillins Rash    Rash in mouth    Patient Measurements: Height: 5\' 4"  (162.6 cm) Weight: 117 lb (53.071 kg) IBW/kg (Calculated) : 54.7  Vital Signs: Temp: 98.8 F (37.1 C) (04/10 0740) Temp Source: Oral (04/10 0740) BP: 132/53 mmHg (04/10 0900) Pulse Rate: 63 (04/10 0900)  Labs:  Recent Labs  05/26/14 1840 05/27/14 0353 05/27/14 0354  05/27/14 1426 05/27/14 2100 05/27/14 2130 05/28/14 0500  HGB 15.9* 14.0  --   < > 12.4 12.3  --  12.3  HCT 46.1* 41.0  --   < > 36.4 36.5  --  37.0  PLT 279 241  --   < > 192 188  --  193  LABPROT 12.6 14.8  --   --   --   --   --   --   INR 0.94 1.14  --   --   --   --   --   --   HEPARINUNFRC  --   --  0.89*  --   --   --  0.28* 0.36  CREATININE 0.80 0.68  --   --   --   --   --   --   < > = values in this interval not displayed.  Estimated Creatinine Clearance: 49.4 mL/min (by C-G formula based on Cr of 0.68).   Medications:  Infusions:  . sodium chloride 75 mL/hr at 05/28/14 0900  . heparin 700 Units/hr (05/28/14 0900)  . tenecteplase (TNKase) infusion Stopped (05/28/14 0940)    Assessment: 80 y/o female on IV heparin for DVT s/p lysis with TNKase. Repeat arteriogram today showed resolution of thrombus. TNKase off currently. Heparin still infusing but to be turned off shortly for sheath removal. RN to wait 1 hr and resume with no bolus.  Heparin level is therapeutic this morning at 0.36 on 700 units/hr. No bleeding noted, CBC stable. Will increase goal now that TNKase is off.  Goal of Therapy:  Heparin level 0.3-0.7 units/ml Monitor platelets by anticoagulation protocol: Yes   Plan:  - Continue heparin drip at 700 units/hr - Will f/u sheath removal and check a heparin level at  steady state after resumed - Daily heparin level and CBC - F/u plan for oral anticoagulation  [facility], [place].D., BCPS Clinical Pharmacist Pager: [phone] 05/28/2014 10:52 AM   Addendum:  Sheath out and heparin resumed at 16:30 per RN.  Heparin drip at 700 units/hr 8 hr heparin level  [facility], Pharm.D., BCPS Clinical Pharmacist Pager: [phone] 05/28/2014 4:44 PM

## 2014-05-29 LAB — HEPARIN LEVEL (UNFRACTIONATED): Heparin Unfractionated: 0.23 IU/mL — ABNORMAL LOW (ref 0.30–0.70)

## 2014-05-29 LAB — CBC
HCT: 39.1 % (ref 36.0–46.0)
Hemoglobin: 13.3 g/dL (ref 12.0–15.0)
MCH: 33.1 pg (ref 26.0–34.0)
MCHC: 34 g/dL (ref 30.0–36.0)
MCV: 97.3 fL (ref 78.0–100.0)
PLATELETS: 180 10*3/uL (ref 150–400)
RBC: 4.02 MIL/uL (ref 3.87–5.11)
RDW: 13.7 % (ref 11.5–15.5)
WBC: 11.4 10*3/uL — ABNORMAL HIGH (ref 4.0–10.5)

## 2014-05-29 MED ORDER — FLUTICASONE PROPIONATE 50 MCG/ACT NA SUSP
2.0000 | Freq: Every day | NASAL | Status: DC
  Filled 2014-05-29: qty 16

## 2014-05-29 MED ORDER — LORATADINE 10 MG PO TABS
10.0000 mg | ORAL_TABLET | Freq: Every day | ORAL | Status: DC | PRN
  Filled 2014-05-29: qty 1

## 2014-05-29 MED ORDER — CLOPIDOGREL BISULFATE 75 MG PO TABS
75.0000 mg | ORAL_TABLET | Freq: Every day | ORAL | Status: DC
  Administered 2014-05-29: 75 mg via ORAL
  Filled 2014-05-29 (×2): qty 1

## 2014-05-29 NOTE — Progress Notes (Signed)
05/29/14 0920- When getting ready to transfer pt to 2W, pt converted from NSR to Afib with rate as high as 166. 2.5 mg Metoprolol IV given. Pt continued to go in and out of Afib until 0945 when she converted back to NSR with rate in the 70s. Pt asymptomatic and denies CP, SOB, or palpitations. VSS, will continue to monitor.  Achille Rich, RN

## 2014-05-29 NOTE — Progress Notes (Signed)
05/29/2014 1215 Chart reviewed. Utilization review complete. Jonnie Finner RN CCM Case Mgmt phone [phone]

## 2014-05-29 NOTE — Clinical Documentation Improvement (Signed)
Potassium 3.3 on admission 05/26/14.  Potassium 20-40 mEq po once ordered 05/27/14.  Please identify any clinical conditions associated with the abnormal potassium and document in your progress note and carry over to the discharge summary.   Component      Potassium  Latest Ref Rng      3.5 - 5.1 mmol/L  05/26/2014      3.3 (L)  05/27/2014     3:53 AM 3.8   Possible Clinical Conditions: -Hypokalemia -Other condition (please specify) -Unable to determine at present  Thank you, Mateo Flow, RN [phone] Clinical Documentation Specialist

## 2014-05-29 NOTE — Progress Notes (Signed)
ANTICOAGULATION CONSULT NOTE - Follow Up Consult  Pharmacy Consult for heparin Indication: DVT  Labs:  Recent Labs  05/26/14 1840 05/27/14 0353  05/27/14 2130 05/28/14 0500 05/28/14 1130 05/28/14 1504 05/29/14 0022  HGB 15.9* 14.0  < >  --  12.3 12.9 13.5 13.3  HCT 46.1* 41.0  < >  --  37.0 38.6 40.0 39.1  PLT 279 241  < >  --  193 184 185 180  LABPROT 12.6 14.8  --   --   --   --   --   --   INR 0.94 1.14  --   --   --   --   --   --   HEPARINUNFRC  --   --   < > 0.28* 0.36  --   --  0.23*  CREATININE 0.80 0.68  --   --   --   --   --   --   < > = values in this interval not displayed.   Assessment: 80yo female subtherapeutic on heparin after resumed, was previously at low end of goal w/ current rate.  Goal of Therapy:  Heparin level 0.3-0.7 units/ml   Plan:  Will increase heparin gtt conservatively by 1 unit/kg/hr to 750 units/hr and check level in [place], PharmD, BCPS  05/29/2014,1:30 AM

## 2014-05-29 NOTE — Progress Notes (Signed)
   VASCULAR SURGERY ASSESSMENT & PLAN:  * s/p thrombolysis right lower extremity.  *  Follow up x-ray shows complete resolution of clot.  * transferred to 2 W.  * ambulate  * follow up with Dr. Everitt Amber in 2 weeks  * we'll add Plavix  SUBJECTIVE: mild calf tenderness  PHYSICAL EXAM: Filed Vitals:   05/29/14 0200 05/29/14 0300 05/29/14 0400 05/29/14 0613  BP: 106/59 128/44 117/49 141/53  Pulse: 59 66 60 80  Temp:      TempSrc:      Resp: 16 17 17 20   Height:      Weight:      SpO2: 95% 95% 95% 95%   Good Doppler signals right foot. Right foot warm and well-perfused. Palpable right femoral pulse.  LABS: Lab Results  Component Value Date   WBC 11.4* 05/29/2014   HGB 13.3 05/29/2014   HCT 39.1 05/29/2014   MCV 97.3 05/29/2014   PLT 180 05/29/2014   Lab Results  Component Value Date   CREATININE 0.68 05/27/2014   Lab Results  Component Value Date   INR 1.14 05/27/2014   Active Problems:   Embolus and thrombosis of iliac artery  Gae Gallop Beeper: [phone] 05/29/2014

## 2014-05-30 LAB — CBC
HCT: 34.5 % — ABNORMAL LOW (ref 36.0–46.0)
Hemoglobin: 11.8 g/dL — ABNORMAL LOW (ref 12.0–15.0)
MCH: 33 pg (ref 26.0–34.0)
MCHC: 34.2 g/dL (ref 30.0–36.0)
MCV: 96.4 fL (ref 78.0–100.0)
PLATELETS: 187 10*3/uL (ref 150–400)
RBC: 3.58 MIL/uL — ABNORMAL LOW (ref 3.87–5.11)
RDW: 13.5 % (ref 11.5–15.5)
WBC: 11.5 10*3/uL — AB (ref 4.0–10.5)

## 2014-05-30 MED ORDER — CLOPIDOGREL BISULFATE 75 MG PO TABS: 75.0000 mg | ORAL_TABLET | Freq: Every day | ORAL | Status: DC

## 2014-05-30 MED ORDER — OXYCODONE HCL 5 MG PO TABS: 5.0000 mg | ORAL_TABLET | Freq: Four times a day (QID) | ORAL | Status: DC | PRN

## 2014-05-30 NOTE — Progress Notes (Signed)
Pt discharged home with husband Discharge instructions given & reviewed Education discussed  IV dc'd  Tele dc'd  Pt did not want to take am meds, will take at home. Will ambulate at home.  Pt discharged via wheelchair with volunteer services  All patient belongs at side.   Kathleen Argue S 10:21 AM

## 2014-05-30 NOTE — Progress Notes (Signed)
   VASCULAR SURGERY ASSESSMENT & PLAN:  * s/p thrombolysis.   * Home today on ASA and Plavix  * F/U with Dr. Everitt Amber   * Hypokalemia resolved after k supplement  SUBJECTIVE: No complaints.  PHYSICAL EXAM: Filed Vitals:   05/29/14 1015 05/29/14 1041 05/29/14 2004 05/30/14 0452  BP: 115/58 99/49 118/44 124/50  Pulse: 65 62 67 77  Temp:  97.5 F (36.4 C) 97.9 F (36.6 C) 99.1 F (37.3 C)  TempSrc:  Oral Oral Oral  Resp:  20 21 19   Height:      Weight:      SpO2: 93% 93% 92% 94%   Right foot warm No hematoma left groin.  LABS: Lab Results  Component Value Date   WBC 11.5* 05/30/2014   HGB 11.8* 05/30/2014   HCT 34.5* 05/30/2014   MCV 96.4 05/30/2014   PLT 187 05/30/2014   Lab Results  Component Value Date   CREATININE 0.68 05/27/2014   Lab Results  Component Value Date   INR 1.14 05/27/2014   CBG (last 3)  No results for input(s): GLUCAP in the last 72 hours.  Active Problems:   Embolus and thrombosis of iliac artery    Gae Gallop Beeper: [phone] 05/30/2014

## 2014-05-30 NOTE — Discharge Summary (Signed)
Discharge Summary    Madison Richards 1934-03-10 80 y.o. female  [id number]  Admission Date: 05/26/2014  Discharge Date: 05/30/14  Physician: Angelia Mould, MD  Admission Diagnosis: Lower limb ischemia [I99.8] Critical lower limb ischemia [I99.8] Absent pedal pulses [R09.89]   HPI:   This is a 80 y.o. female who developed the sudden onset of pain in the right lower extremity at approximately 4:30 PM today. The patient is followed by Dr. Everitt Amber. She has a known small distal abdominal aortic aneurysm and bilateral iliac artery occlusive disease. She apparently had a right common iliac artery stent placed in 2003. 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 On 04/06/2012 this was treated with a long covered stent which extended from above the aneurysm in the common iliac artery down into the distal external iliac artery on the right. She states that she has had right hip claudication but denies significant calf claudication and has had no significant symptoms in the left lower extremity. She denies any history of rest pain or history of nonhealing ulcers.  The pain in her right leg developed suddenly at 4:30 PM and has improved slightly since she was started on heparin. In addition she has received some pain medication. She was seen in [facility], and the case was discussed with Dr. Everitt Amber, who recommended a CT angiogram. The CT angiogram showed that the common iliac artery stent was patent on the right although there was clot at the distal aspect of the stent and also the patient had an occluded popliteal artery.  The patient's risk factors for vascular disease include hypertension, hypercholesterolemia, a history of premature cardiovascular disease, and continued tobacco use. She smokes 4-5 cigarettes a day and has been smoking since she was 80 years old.  I do not see any contraindications to  thrombolysis. Specifically she denies any history of GI bleed or any history of brain tumors or stroke.   Hospital Course:  The patient was admitted to the hospital and taken to Interventional Radiology for thrombolysis of the RLE. She was also started on heparin.    By the next morning, her right foot was slightly improved and an ATA signal was obtained with the doppler.    On 05/28/14, she was taken for an arteriogram, which revealed a right popliteal artery that is widely patent with 3 vessel runoff and thrombus resolved.    By the next day, she was transported to the telemetry floor.  She was started on Plavix. And she will f/u with Dr. Einar Gip in 2 weeks.  She is discharged home on Plavix and aspirin.  Her hypokalemia was resolved after K supplementation.  The remainder of the hospital course consisted of increasing mobilization and increasing intake of solids without difficulty.  CBC    Component Value Date/Time   WBC 11.5* 05/30/2014 0443   RBC 3.58* 05/30/2014 0443   HGB 11.8* 05/30/2014 0443   HCT 34.5* 05/30/2014 0443   PLT 187 05/30/2014 0443   MCV 96.4 05/30/2014 0443   MCH 33.0 05/30/2014 0443   MCHC 34.2 05/30/2014 0443   RDW 13.5 05/30/2014 0443   LYMPHSABS 2.5 05/26/2014 1840   MONOABS 1.1* 05/26/2014 1840   EOSABS 0.2 05/26/2014 1840   BASOSABS 0.1 05/26/2014 1840    BMET    Component Value Date/Time   NA 139 05/27/2014 0353   K 3.8 05/27/2014 0353   CL 109 05/27/2014 0353   CO2  27 05/27/2014 0353   GLUCOSE 127* 05/27/2014 0353   BUN 23 05/27/2014 0353   CREATININE 0.68 05/27/2014 0353   CALCIUM 8.7 05/27/2014 0353   GFRNONAA 82* 05/27/2014 0353   GFRAA >90 05/27/2014 0353      Discharge Instructions    Call MD for:  redness, tenderness, or signs of infection (pain, swelling, bleeding, redness, odor or green/yellow discharge around incision site)    Complete by:  As directed      Call MD for:  severe or increased pain, loss or decreased feeling  in  affected limb(s)    Complete by:  As directed      Call MD for:  temperature >100.5    Complete by:  As directed      Driving Restrictions    Complete by:  As directed   No driving for 2 weeks     Lifting restrictions    Complete by:  As directed   No lifting for 4 weeks     Resume previous diet    Complete by:  As directed      may wash over wound with mild soap and water    Complete by:  As directed            Discharge Diagnosis:  Lower limb ischemia [I99.8] Critical lower limb ischemia [I99.8] Absent pedal pulses [R09.89]  Secondary Diagnosis: Patient Active Problem List   Diagnosis Date Noted  . Embolus and thrombosis of iliac artery 05/26/2014   Past Medical History  Diagnosis Date  . Hypertension   . Hyperlipidemia   . Osteoporosis   . GERD (gastroesophageal reflux disease)   . Neck pain   . Generalized headaches     due to neck pain       Medication List    TAKE these medications        aspirin EC 81 MG tablet  Take 81 mg by mouth daily.     CALCIUM 600 + D PO  Take 1 tablet by mouth 2 (two) times daily.     celecoxib 200 MG capsule  Commonly known as:  CELEBREX  Take 200 mg by mouth daily as needed. For pain     clopidogrel 75 MG tablet  Commonly known as:  PLAVIX  Take 1 tablet (75 mg total) by mouth daily.     colesevelam 625 MG tablet  Commonly known as:  WELCHOL  Take 625 mg by mouth 2 (two) times daily with a meal.     EXFORGE HCT 5-160-12.5 MG Tabs  Generic drug:  Amlodipine-Valsartan-HCTZ  Take 1 tablet by mouth daily.     ezetimibe 10 MG tablet  Commonly known as:  ZETIA  Take 10 mg by mouth daily.     fluticasone 50 MCG/ACT nasal spray  Commonly known as:  FLONASE  Place 2 sprays into the nose daily.     LIVALO 2 MG Tabs  Generic drug:  Pitavastatin Calcium  Take 1 tablet by mouth every Monday, Wednesday, and Friday.     loratadine 10 MG tablet  Commonly known as:  CLARITIN  Take 10 mg by mouth as needed.      metoprolol tartrate 25 MG tablet  Commonly known as:  LOPRESSOR  Take 25 mg by mouth 2 (two) times daily.     multivitamin with minerals Tabs tablet  Take 1 tablet by mouth daily.     oxyCODONE 5 MG immediate release tablet  Commonly known as:  ROXICODONE  Take 1  tablet (5 mg total) by mouth every 6 (six) hours as needed.     Vitamin D3 5000 UNITS Tabs  Take 1 tablet by mouth every Monday, Wednesday, and Friday.        Prescriptions given: Roxicodone #30 No Refill  Instructions: 1.  Start taking Plavix  Disposition: home  Patient's condition: is Good  Follow up: 1. Dr. Einar Gip in 2 weeks   Leontine Locket, PA-C [facility] [phone] 05/30/2014  7:47 AM

## 2014-05-30 NOTE — Care Management Note (Signed)
    Page 1 of 1   05/30/2014     10:22:35 AM CARE MANAGEMENT NOTE 05/30/2014  Patient:  Madison Richards   Account Number:  [id number]  Date Initiated:  05/29/2014  Documentation initiated by:  [facility]  Subjective/Objective Assessment:   clot at the distal aspect of the stent in the right external iliac artery     Action/Plan:   Anticipated DC Date:  05/30/2014   Anticipated DC Plan:  [place]  CM consult      Choice offered to / List presented to:             Status of service:  Completed, signed off Medicare Important Message given?  YES (If response is "NO", the following Medicare IM given date fields will be blank) Date Medicare IM given:  05/30/2014 Medicare IM given by:  Marvetta Gibbons Date Additional Medicare IM given:   Additional Medicare IM given by:    Discharge Disposition:  HOME/SELF CARE  Per UR Regulation:  Reviewed for med. necessity/level of care/duration of stay  If discussed at [place] of Stay Meetings, dates discussed:    Comments:  05/29/2014 1215 Chart reviewed. Utilization review complete. Jonnie Finner RN CCM Case Mgmt phone [phone]

## 2014-12-27 ENCOUNTER — Other Ambulatory Visit: Payer: Self-pay

## 2014-12-27 DIAGNOSIS — Z1231 Encounter for screening mammogram for malignant neoplasm of breast: Secondary | ICD-10-CM

## 2015-02-05 ENCOUNTER — Ambulatory Visit
Admission: RE | Admit: 2015-02-05 | Discharge: 2015-02-05 | Disposition: A | Discharge status: Home / Self Care | Payer: Medicare Other | Source: Ambulatory Visit

## 2015-02-05 DIAGNOSIS — Z1231 Encounter for screening mammogram for malignant neoplasm of breast: Secondary | ICD-10-CM

## 2016-01-07 ENCOUNTER — Other Ambulatory Visit: Payer: Self-pay | Admitting: Internal Medicine

## 2016-01-07 DIAGNOSIS — Z1231 Encounter for screening mammogram for malignant neoplasm of breast: Secondary | ICD-10-CM

## 2016-02-12 ENCOUNTER — Ambulatory Visit
Admission: RE | Admit: 2016-02-12 | Discharge: 2016-02-12 | Disposition: A | Discharge status: Home / Self Care | Payer: Medicare Other | Source: Ambulatory Visit | Attending: Internal Medicine | Admitting: Internal Medicine

## 2016-02-12 DIAGNOSIS — Z1231 Encounter for screening mammogram for malignant neoplasm of breast: Secondary | ICD-10-CM

## 2016-10-03 ENCOUNTER — Other Ambulatory Visit: Payer: Self-pay | Admitting: Cardiology

## 2016-10-03 DIAGNOSIS — R4781 Slurred speech: Secondary | ICD-10-CM

## 2016-10-08 ENCOUNTER — Ambulatory Visit
Admission: RE | Admit: 2016-10-08 | Discharge: 2016-10-08 | Disposition: A | Discharge status: Home / Self Care | Payer: Medicare Other | Source: Ambulatory Visit | Attending: Cardiology | Admitting: Cardiology

## 2016-10-08 DIAGNOSIS — R4781 Slurred speech: Secondary | ICD-10-CM

## 2017-03-23 ENCOUNTER — Other Ambulatory Visit: Payer: Self-pay | Admitting: Family Medicine

## 2017-03-23 ENCOUNTER — Other Ambulatory Visit: Payer: Self-pay | Admitting: Internal Medicine

## 2017-03-23 DIAGNOSIS — Z139 Encounter for screening, unspecified: Secondary | ICD-10-CM

## 2017-04-13 ENCOUNTER — Ambulatory Visit
Admission: RE | Admit: 2017-04-13 | Discharge: 2017-04-13 | Disposition: A | Discharge status: Home / Self Care | Payer: Medicare Other | Source: Ambulatory Visit | Attending: Family Medicine | Admitting: Family Medicine

## 2017-04-13 DIAGNOSIS — Z139 Encounter for screening, unspecified: Secondary | ICD-10-CM

## 2017-04-14 ENCOUNTER — Other Ambulatory Visit: Payer: Self-pay | Admitting: Family Medicine

## 2017-04-14 DIAGNOSIS — R928 Other abnormal and inconclusive findings on diagnostic imaging of breast: Secondary | ICD-10-CM

## 2017-04-20 ENCOUNTER — Ambulatory Visit
Admission: RE | Admit: 2017-04-20 | Discharge: 2017-04-20 | Disposition: A | Discharge status: Home / Self Care | Payer: Medicare Other | Source: Ambulatory Visit | Attending: Family Medicine | Admitting: Family Medicine

## 2017-04-20 DIAGNOSIS — R928 Other abnormal and inconclusive findings on diagnostic imaging of breast: Secondary | ICD-10-CM

## 2018-02-08 ENCOUNTER — Inpatient Hospital Stay (HOSPITAL_BASED_OUTPATIENT_CLINIC_OR_DEPARTMENT_OTHER)
Admission: EM | Admit: 2018-02-08 | Discharge: 2018-02-09 | DRG: 066 | Disposition: A | Discharge status: Home Health | Payer: Medicare Other | Attending: Internal Medicine | Admitting: Internal Medicine

## 2018-02-08 ENCOUNTER — Encounter (HOSPITAL_BASED_OUTPATIENT_CLINIC_OR_DEPARTMENT_OTHER): Payer: Self-pay

## 2018-02-08 ENCOUNTER — Emergency Department (HOSPITAL_BASED_OUTPATIENT_CLINIC_OR_DEPARTMENT_OTHER): Payer: Medicare Other

## 2018-02-08 ENCOUNTER — Emergency Department (HOSPITAL_COMMUNITY): Payer: Medicare Other

## 2018-02-08 ENCOUNTER — Other Ambulatory Visit: Payer: Self-pay

## 2018-02-08 DIAGNOSIS — Z888 Allergy status to other drugs, medicaments and biological substances status: Secondary | ICD-10-CM

## 2018-02-08 DIAGNOSIS — Z7982 Long term (current) use of aspirin: Secondary | ICD-10-CM

## 2018-02-08 DIAGNOSIS — D509 Iron deficiency anemia, unspecified: Secondary | ICD-10-CM | POA: Diagnosis present

## 2018-02-08 DIAGNOSIS — I6381 Other cerebral infarction due to occlusion or stenosis of small artery: Secondary | ICD-10-CM | POA: Diagnosis not present

## 2018-02-08 DIAGNOSIS — Z881 Allergy status to other antibiotic agents status: Secondary | ICD-10-CM

## 2018-02-08 DIAGNOSIS — Z7902 Long term (current) use of antithrombotics/antiplatelets: Secondary | ICD-10-CM

## 2018-02-08 DIAGNOSIS — K219 Gastro-esophageal reflux disease without esophagitis: Secondary | ICD-10-CM | POA: Diagnosis present

## 2018-02-08 DIAGNOSIS — R531 Weakness: Secondary | ICD-10-CM | POA: Diagnosis not present

## 2018-02-08 DIAGNOSIS — I739 Peripheral vascular disease, unspecified: Secondary | ICD-10-CM | POA: Diagnosis present

## 2018-02-08 DIAGNOSIS — I6522 Occlusion and stenosis of left carotid artery: Secondary | ICD-10-CM | POA: Diagnosis present

## 2018-02-08 DIAGNOSIS — R4781 Slurred speech: Secondary | ICD-10-CM | POA: Diagnosis present

## 2018-02-08 DIAGNOSIS — Z791 Long term (current) use of non-steroidal anti-inflammatories (NSAID): Secondary | ICD-10-CM

## 2018-02-08 DIAGNOSIS — I639 Cerebral infarction, unspecified: Secondary | ICD-10-CM | POA: Diagnosis present

## 2018-02-08 DIAGNOSIS — Z880 Allergy status to penicillin: Secondary | ICD-10-CM

## 2018-02-08 DIAGNOSIS — E785 Hyperlipidemia, unspecified: Secondary | ICD-10-CM | POA: Diagnosis present

## 2018-02-08 DIAGNOSIS — M810 Age-related osteoporosis without current pathological fracture: Secondary | ICD-10-CM | POA: Diagnosis present

## 2018-02-08 DIAGNOSIS — I10 Essential (primary) hypertension: Secondary | ICD-10-CM | POA: Diagnosis present

## 2018-02-08 DIAGNOSIS — Z79899 Other long term (current) drug therapy: Secondary | ICD-10-CM

## 2018-02-08 DIAGNOSIS — F17210 Nicotine dependence, cigarettes, uncomplicated: Secondary | ICD-10-CM | POA: Diagnosis present

## 2018-02-08 LAB — APTT: aPTT: 31 seconds (ref 24–36)

## 2018-02-08 LAB — CBC
HCT: 31.8 % — ABNORMAL LOW (ref 36.0–46.0)
Hemoglobin: 9.5 g/dL — ABNORMAL LOW (ref 12.0–15.0)
MCH: 23.1 pg — ABNORMAL LOW (ref 26.0–34.0)
MCHC: 29.9 g/dL — ABNORMAL LOW (ref 30.0–36.0)
MCV: 77.2 fL — ABNORMAL LOW (ref 80.0–100.0)
Platelets: 422 10*3/uL — ABNORMAL HIGH (ref 150–400)
RBC: 4.12 MIL/uL (ref 3.87–5.11)
RDW: 18.3 % — ABNORMAL HIGH (ref 11.5–15.5)
WBC: 9.5 10*3/uL (ref 4.0–10.5)
nRBC: 0 % (ref 0.0–0.2)

## 2018-02-08 LAB — URINALYSIS, ROUTINE W REFLEX MICROSCOPIC
Bilirubin Urine: NEGATIVE
Glucose, UA: NEGATIVE mg/dL
Ketones, ur: 15 mg/dL — AB
Leukocytes, UA: NEGATIVE
Nitrite: NEGATIVE
Protein, ur: NEGATIVE mg/dL
Specific Gravity, Urine: 1.01 (ref 1.005–1.030)
pH: 5.5 (ref 5.0–8.0)

## 2018-02-08 LAB — PROTIME-INR
INR: 0.97
Prothrombin Time: 12.8 seconds (ref 11.4–15.2)

## 2018-02-08 LAB — COMPREHENSIVE METABOLIC PANEL
ALT: 12 U/L (ref 0–44)
AST: 24 U/L (ref 15–41)
Albumin: 3.8 g/dL (ref 3.5–5.0)
Alkaline Phosphatase: 57 U/L (ref 38–126)
Anion gap: 9 (ref 5–15)
BUN: 18 mg/dL (ref 8–23)
CO2: 21 mmol/L — ABNORMAL LOW (ref 22–32)
Calcium: 9.9 mg/dL (ref 8.9–10.3)
Chloride: 107 mmol/L (ref 98–111)
Creatinine, Ser: 0.56 mg/dL (ref 0.44–1.00)
GFR calc Af Amer: >60 mL/min (ref >60)
GFR calc non Af Amer: >60 mL/min (ref >60)
Glucose, Bld: 114 mg/dL — ABNORMAL HIGH (ref 70–99)
Potassium: 3.6 mmol/L (ref 3.5–5.1)
Sodium: 137 mmol/L (ref 135–145)
Total Bilirubin: 0.5 mg/dL (ref 0.3–1.2)
Total Protein: 7.1 g/dL (ref 6.5–8.1)

## 2018-02-08 LAB — DIFFERENTIAL
Abs Immature Granulocytes: 0.04 10*3/uL (ref 0.00–0.07)
Basophils Absolute: 0.1 10*3/uL (ref 0.0–0.1)
Basophils Relative: 1 %
Eosinophils Absolute: 0.1 10*3/uL (ref 0.0–0.5)
Eosinophils Relative: 1 %
Immature Granulocytes: 0 %
Lymphocytes Relative: 11 %
Lymphs Abs: 1.1 10*3/uL (ref 0.7–4.0)
Monocytes Absolute: 0.6 10*3/uL (ref 0.1–1.0)
Monocytes Relative: 6 %
Neutro Abs: 7.7 10*3/uL (ref 1.7–7.7)
Neutrophils Relative %: 81 %

## 2018-02-08 LAB — TROPONIN I: Troponin I: <0.03 ng/mL (ref <0.03)

## 2018-02-08 LAB — URINALYSIS, MICROSCOPIC (REFLEX): WBC, UA: NONE SEEN WBC/hpf (ref 0–5)

## 2018-02-08 LAB — OCCULT BLOOD X 1 CARD TO LAB, STOOL: Fecal Occult Bld: NEGATIVE

## 2018-02-08 NOTE — ED Notes (Signed)
ED Provider at bedside. 

## 2018-02-08 NOTE — ED Provider Notes (Addendum)
[facility] EMERGENCY DEPARTMENT Provider Note   CSN: [id number] Arrival date & time: 02/08/18  1641     History   Chief Complaint Chief Complaint  Patient presents with  . Gait Problem    HPI Madison Richards is a 83 y.o. female with history of hypertension, hyperlipidemia, GERD, osteoporosis who presents with report of feeling wobbly and weak on her right side.  Her family also reports noticeable slurred speech today.  Patient reports she last felt herself when she went to bed last evening, however then reported that she may have been wobbly for the past couple days.  She denies any pain.  She denies any vision changes, numbness or tingling, chest pain, shortness of breath, abdominal pain, nausea, vomiting, headaches.  Patient does note she has been urinating frequently today.  She denies any fevers or cough.  No medications taken for her symptoms prior to arrival.  Patient is anticoagulated on Plavix.  HPI  Past Medical History:  Diagnosis Date  . Generalized headaches    due to neck pain  . GERD (gastroesophageal reflux disease)   . Hyperlipidemia   . Hypertension   . Neck pain   . Osteoporosis     Patient Active Problem List   Diagnosis Date Noted  . Acute ischemic stroke ([place]) 02/09/2018  . HTN (hypertension) 02/09/2018  . PAD (peripheral artery disease) ([place]) 02/09/2018  . Left carotid artery stenosis 02/09/2018  . 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 ABDOMINAL ANGIOGRAM Bilateral 03/02/2012   Procedure: ABDOMINAL ANGIOGRAM;  Surgeon: Laverda Page, MD;  Location: [facility] CATH LAB;  Service: Cardiovascular;  Laterality: Bilateral;  . ABDOMINAL ANGIOGRAM  04/06/2012   Procedure: ABDOMINAL ANGIOGRAM;  Surgeon: Laverda Page, MD;  Location: [facility] CATH LAB;  Service: Cardiovascular;;  . abdominal tumor     removed in 1968/benign  . APPENDECTOMY    . ILIAC ARTERY STENT     right side  2003 and 2014     OB History   No obstetric history on file.      Home Medications    Prior to Admission medications   Medication Sig Start Date End Date Taking? Authorizing Provider  Amlodipine-Valsartan-HCTZ (EXFORGE HCT) 5-160-12.5 MG TABS Take 1 tablet by mouth daily.   Yes [provider]  aspirin EC 81 MG tablet Take 81 mg by mouth daily.   Yes [provider]  Calcium Carbonate-Vitamin D (CALCIUM 600 + D PO) Take 1 tablet by mouth 2 (two) times daily.   Yes [provider]  celecoxib (CELEBREX) 200 MG capsule Take 200 mg by mouth daily as needed for mild pain.    Yes [provider]  Cholecalciferol (VITAMIN D3) 5000 UNITS TABS Take 1 tablet by mouth every Monday, Wednesday, and Friday.   Yes [provider]  clopidogrel (PLAVIX) 75 MG tablet Take 1 tablet (75 mg total) by mouth daily. 05/30/14  Yes Rhyne, Hulen Shouts, PA-C  colesevelam (WELCHOL) 625 MG tablet Take 625 mg by mouth 2 (two) times daily with a meal.    Yes [provider]  ezetimibe (ZETIA) 10 MG tablet Take 10 mg by mouth daily.   Yes [provider]  loratadine (CLARITIN) 10 MG tablet Take 10 mg by mouth daily as needed for allergies.    Yes [provider]  metoprolol tartrate (LOPRESSOR) 25 MG tablet Take 25 mg by mouth 2 (two) times daily.  Yes [provider]  Multiple Vitamin (MULTIVITAMIN WITH MINERALS) TABS Take 1 tablet by mouth daily.   Yes [provider]  Pitavastatin Calcium (LIVALO) 2 MG TABS Take 1 tablet by mouth 4 (four) times a week. Monday, Wednesday, Friday and Saturday   Yes [provider]  risedronate (ACTONEL) 35 MG tablet Take 35 mg by mouth every 7 (seven) days. with water on empty stomach, nothing by mouth or lie down for next 30 minutes.   Yes [provider]  pravastatin (PRAVACHOL) 40 MG tablet Take 1 tablet (40 mg total) by mouth at bedtime. 02/09/18   Mendel Corning, MD     Family History Family History  Problem Relation Age of Onset  . Heart disease Sister   . Heart disease Brother   . Heart disease Brother   . Colon cancer Neg Hx     Social History Social History   Tobacco Use  . Smoking status: Current Some Day Smoker    Types: Cigarettes  . Smokeless tobacco: Never Used  Substance Use Topics  . Alcohol use: Yes    Comment: daily  . Drug use: No     Allergies   Clindamycin/lincomycin; Neosporin [neomycin-polymyxin-gramicidin]; and Penicillins   Review of Systems Review of Systems  Constitutional: Negative for chills and fever.  HENT: Negative for facial swelling and sore throat.   Respiratory: Negative for shortness of breath.   Cardiovascular: Negative for chest pain.  Gastrointestinal: Negative for abdominal pain, nausea and vomiting.  Genitourinary: Positive for frequency. Negative for dysuria.  Musculoskeletal: Positive for gait problem. Negative for back pain.  Skin: Negative for rash and wound.  Neurological: Positive for speech difficulty. Negative for headaches.  Psychiatric/Behavioral: The patient is not nervous/anxious.      Physical Exam Updated Vital Signs BP 138/65 (BP Location: Left Arm)   Pulse 71   Temp (!) 97.5 F (36.4 C) (Oral)   Resp 16   Ht 5\' 3"  (1.6 m)   Wt 49.9 kg   SpO2 93%   BMI 19.49 kg/m   Physical Exam Vitals signs and nursing note reviewed.  Constitutional:      General: She is not in acute distress.    Appearance: She is well-developed. She is not diaphoretic.  HENT:     Head: Normocephalic and atraumatic.     Mouth/Throat:     Pharynx: No oropharyngeal exudate.  Eyes:     General: No scleral icterus.       Right eye: No discharge.        Left eye: No discharge.     Conjunctiva/sclera: Conjunctivae normal.     Pupils: Pupils are equal, round, and reactive to light.  Neck:     Musculoskeletal: Normal range of motion and neck supple.     Thyroid: No thyromegaly.   Cardiovascular:     Rate and Rhythm: Normal rate and regular rhythm.     Heart sounds: Normal heart sounds. No murmur. No friction rub. No gallop.   Pulmonary:     Effort: Pulmonary effort is normal. No respiratory distress.     Breath sounds: Normal breath sounds. No stridor. No wheezing or rales.  Abdominal:     General: Bowel sounds are normal. There is no distension.     Palpations: Abdomen is soft.     Tenderness: There is no abdominal tenderness. There is no guarding or rebound.  Lymphadenopathy:     Cervical: No cervical adenopathy.  Skin:    General: Skin  is warm and dry.     Coloration: Skin is not pale.     Findings: No rash.  Neurological:     Mental Status: She is alert.     Coordination: Coordination normal.     Comments: CN 3-12 intact; normal sensation throughout; 5/5 strength in left-sided extremities, 4/5 strength in the right upper extremity, 5/5 strength to the right lower extremity; equal bilateral grip strength; no ataxia on finger-to-nose, negative Romberg       ED Treatments / Results  Labs (all labs ordered are listed, but only abnormal results are displayed) Labs Reviewed  CBC - Abnormal; Notable for the following components:      Result Value   Hemoglobin 9.5 (*)    HCT 31.8 (*)    MCV 77.2 (*)    MCH 23.1 (*)    MCHC 29.9 (*)    RDW 18.3 (*)    Platelets 422 (*)    All other components within normal limits  COMPREHENSIVE METABOLIC PANEL - Abnormal; Notable for the following components:   CO2 21 (*)    Glucose, Bld 114 (*)    All other components within normal limits  URINALYSIS, ROUTINE W REFLEX MICROSCOPIC - Abnormal; Notable for the following components:   Hgb urine dipstick MODERATE (*)    Ketones, ur 15 (*)    All other components within normal limits  URINALYSIS, MICROSCOPIC (REFLEX) - Abnormal; Notable for the following components:   Bacteria, UA RARE (*)    All other components within normal limits  IRON AND TIBC - Abnormal;  Notable for the following components:   Iron 18 (*)    TIBC 510 (*)    Saturation Ratios 4 (*)    All other components within normal limits  FERRITIN - Abnormal; Notable for the following components:   Ferritin 8 (*)    All other components within normal limits  RETICULOCYTES - Abnormal; Notable for the following components:   Immature Retic Fract 23.2 (*)    All other components within normal limits  PROTIME-INR  APTT  DIFFERENTIAL  TROPONIN I  OCCULT BLOOD X 1 CARD TO LAB, STOOL  LIPID PANEL  VITAMIN B12  FOLATE  HEMOGLOBIN A1C  CBC  BASIC METABOLIC PANEL    EKG EKG Interpretation  Date/Time:  Monday February 08 2018 17:43:13 EST Ventricular Rate:  70 PR Interval:    QRS Duration: 84 QT Interval:  425 QTC Calculation: 459 R Axis:   1 Text Interpretation:  Sinus rhythm Minimal ST depression, anterolateral leads No significant change since last tracing Confirmed by Blanchie Dessert [phone]) on 02/08/2018 7:34:06 PM   Radiology Dg Chest 2 View  Result Date: 02/09/2018 CLINICAL DATA:  Shortness of breath, cough EXAM: CHEST - 2 VIEW COMPARISON:  04/10/2017 FINDINGS: Right perihilar subsegmental atelectasis. Left lung clear. Heart is normal size. No effusions or acute bony abnormality. IMPRESSION: Right perihilar atelectasis.  No active disease. Electronically Signed   By: Rolm Baptise M.D.   On: 02/09/2018 10:11   Ct Head Wo Contrast  Result Date: 02/08/2018 CLINICAL DATA:  Slurred speech and right-sided weakness. EXAM: CT HEAD WITHOUT CONTRAST TECHNIQUE: Contiguous axial images were obtained from the base of the skull through the vertex without intravenous contrast. COMPARISON:  10/08/2016 FINDINGS: Brain: The brainstem, cerebellum, cerebral peduncles, thalami, basal ganglia, basilar cisterns, and ventricular system appear within normal limits. Periventricular white matter and corona radiata hypodensities favor chronic ischemic microvascular white matter disease. No  intracranial hemorrhage, mass lesion, or acute  CVA. Vascular: There is atherosclerotic calcification of the cavernous carotid arteries bilaterally. Skull: Unremarkable Sinuses/Orbits: Minimal left chronic ethmoid sinusitis. Other: No supplemental non-categorized findings. IMPRESSION: 1. No acute intracranial findings. 2. Periventricular white matter and corona radiata hypodensities favor chronic ischemic microvascular white matter disease. 3. Minimal chronic left ethmoid sinusitis. Electronically Signed   By: Van Clines M.D.   On: 02/08/2018 18:04   Mr Brain Wo Contrast  Result Date: 02/08/2018 CLINICAL DATA:  Initial evaluation for focal neural deficit, stroke suspected. Right-sided weakness. EXAM: MRI HEAD WITHOUT CONTRAST TECHNIQUE: Multiplanar, multiecho pulse sequences of the brain and surrounding structures were obtained without intravenous contrast. COMPARISON:  Prior CT from earlier the same day. FINDINGS: Brain: Generalized age-related cerebral atrophy. Mild patchy T2/FLAIR hyperintensity within the periventricular and deep white matter both cerebral hemispheres most consistent with chronic small vessel ischemic disease, mild for age. 11 mm curvilinear focus of restricted diffusion extending from the posterior left internal capsule towards the left caudate, consistent with a small acute ischemic lacunar type infarct (series 5, image 73). No associated hemorrhage or mass effect. No other evidence for acute or subacute ischemia. No foci of susceptibility artifact to suggest acute or chronic intracranial hemorrhage. No mass lesion, midline shift or mass effect. No hydrocephalus. No extra-axial fluid collection. Pituitary gland within normal limits. Vascular: Major intracranial vascular flow voids maintained. Skull and upper cervical spine: Craniocervical junction within normal limits. No focal marrow replacing lesion. Scalp soft tissues unremarkable. Sinuses/Orbits: Patient status post bilateral  ocular lens replacement. Mild scattered mucosal thickening within the ethmoidal air cells. Paranasal sinuses are otherwise clear. No mastoid effusion. Inner ear structures grossly normal. Other: None. IMPRESSION: 1. 11 mm acute ischemic sooner type infarct involving the posterior left internal capsule and caudate. No associated hemorrhage. 2. Underlying age-related cerebral atrophy with mild chronic small vessel ischemic disease. Electronically Signed   By: Jeannine Boga M.D.   On: 02/08/2018 23:35   Mr Jodene Nam Head Wo Contrast  Result Date: 02/09/2018 CLINICAL DATA:  Right-sided weakness. Acute stroke in the left basal ganglia/white matter tracks. EXAM: MRA HEAD WITHOUT CONTRAST TECHNIQUE: Angiographic images of the Circle of Willis were obtained using MRA technique without intravenous contrast. COMPARISON:  MRI yesterday. FINDINGS: Both internal carotid arteries are widely patent into the brain. No siphon stenosis. The anterior and middle cerebral vessels are patent without proximal stenosis, aneurysm or vascular malformation. Both vertebral arteries are widely patent to the basilar. No basilar stenosis. Posterior circulation branch vessels appear normal. IMPRESSION: Normal examination. Electronically Signed   By: Nelson Chimes M.D.   On: 02/09/2018 08:42   Vas US Carotid (at [place] Only)  Result Date: 02/09/2018 Carotid Arterial Duplex Study Indications: CVA. Performing Technologist: Toma Copier RVS  Examination Guidelines: A complete evaluation includes B-mode imaging, spectral Doppler, color Doppler, and power Doppler as needed of all accessible portions of each vessel. Bilateral testing is considered an integral part of a complete examination. Limited examinations for reoccurring indications may be performed as noted.  Right Carotid Findings: +----------+--------+--------+--------+--------------------+-------------------+           PSV cm/sEDV cm/sStenosisDescribe             Comments            +----------+--------+--------+--------+--------------------+-------------------+ CCA Prox  101     12  minimal intimal                                                           thickening          +----------+--------+--------+--------+--------------------+-------------------+ CCA Distal101     10                                                      +----------+--------+--------+--------+--------------------+-------------------+ ICA Prox  68      9       1-39%   irregular and       far wall at the                                       heterogenous        origin              +----------+--------+--------+--------+--------------------+-------------------+ ICA Mid   115     16                                                      +----------+--------+--------+--------+--------------------+-------------------+ ICA Distal111     20                                                      +----------+--------+--------+--------+--------------------+-------------------+ ECA       148     7               irregular and       moderate at the                                       heterogenous        origin              +----------+--------+--------+--------+--------------------+-------------------+ +----------+--------+-------+--------+-------------------+           PSV cm/sEDV cmsDescribeArm Pressure (mmHG) +----------+--------+-------+--------+-------------------+ Subclavian109                                        +----------+--------+-------+--------+-------------------+ +---------+--------+--+--------+--+ VertebralPSV cm/s78EDV cm/s15 +---------+--------+--+--------+--+  Left Carotid Findings: +----------+--------+--------+--------+---------------------+------------------+           PSV cm/sEDV cm/sStenosisDescribe             Comments            +----------+--------+--------+--------+---------------------+------------------+ CCA Prox  86      13                                   mild intimal  changes            +----------+--------+--------+--------+---------------------+------------------+ CCA Distal75      10              heterogenous and                                                          focal                                   +----------+--------+--------+--------+---------------------+------------------+ ICA Prox  102     14      1-39%   heterogenous         on the far wall                                                           with acoustic                                                             shadowing          +----------+--------+--------+--------+---------------------+------------------+ ICA Mid   80      14                                                      +----------+--------+--------+--------+---------------------+------------------+ ICA Distal92      19                                                      +----------+--------+--------+--------+---------------------+------------------+ ECA       217     25              heterogenous and     moderate plaque                                      irregular                               +----------+--------+--------+--------+---------------------+------------------+ +----------+--------+--------+--------+-------------------+ SubclavianPSV cm/sEDV cm/sDescribeArm Pressure (mmHG) +----------+--------+--------+--------+-------------------+           188                                         +----------+--------+--------+--------+-------------------+ +---------+--------+--+--------+-+------------+ VertebralPSV cm/s44EDV cm/s8" to - fro " +---------+--------+--+--------+-+------------+  Summary: Right Carotid: Velocities in the  right ICA are consistent  with a 1-39% stenosis. Left Carotid: Velocities in the left ICA are consistent with a 1-39% stenosis. Vertebrals:  Right vertebral artery demonstrates antegrade flow. "Bunny " sign              on the left. Subclavians: Left subclavian artery flow was disturbed. Normal flow hemodynamics              were seen in the right subclavian artery. *See table(s) above for measurements and observations.     Preliminary     Procedures Procedures (including critical care time)  Medications Ordered in ED Medications   stroke: mapping our early stages of recovery book (has no administration in time range)  acetaminophen (TYLENOL) tablet 650 mg (has no administration in time range)    Or  acetaminophen (TYLENOL) solution 650 mg (has no administration in time range)    Or  acetaminophen (TYLENOL) suppository 650 mg (has no administration in time range)  enoxaparin (LOVENOX) injection 40 mg (40 mg Subcutaneous Given 02/09/18 1111)  aspirin EC tablet 81 mg (81 mg Oral Given 02/09/18 1111)  clopidogrel (PLAVIX) tablet 75 mg (75 mg Oral Given 02/09/18 1111)  colesevelam [facility]) tablet 625 mg (625 mg Oral Given 02/09/18 1743)  ezetimibe (ZETIA) tablet 10 mg (10 mg Oral Given 02/09/18 1111)  loratadine (CLARITIN) tablet 10 mg (has no administration in time range)  multivitamin with minerals tablet 1 tablet (1 tablet Oral Given 02/09/18 1111)  pravastatin (PRAVACHOL) tablet 40 mg (40 mg Oral Given 02/09/18 1743)     Initial Impression / Assessment and Plan / ED Course  I have reviewed the triage vital signs and the nursing notes.  Pertinent labs & imaging results that were available during my care of the patient were reviewed by me and considered in my medical decision making (see chart for details).  Clinical Course as of Feb 09 1805  Mon Feb 08, 2018  2229 Plan:  MRI ordered.     [HM]  2229 Below previous level of 11.8 (3 years ago), unknown baseline  Hemoglobin(!): 9.5 [HM]   Tue Feb 09, 2018  0017 Discussed with Dr. Alcario Drought who will consult neurology and admit.    [HM]    Clinical Course User Index [HM] [place], Jarrett [place], [place]    Patient presenting with right sided weakness and reported slurred speech by family that was noticed today.  Patient felt normal prior to going to bed yesterday.  She does have weakened grip strength on the right side and 4/5 strength on the upper right and lower right extremities.  Otherwise her neuro exam is normal.  The CBC shows hemoglobin 9.5 and a microcytic anemia.  Patient is unsure of her baseline, but she did have labs earlier this year.  I am unable to see patient's baseline considering she is a patient at [facility].  Fecal occult negative.  Labs otherwise unremarkable.  UA shows moderate hematuria.  This is consistent with the urine 2 years ago.  CT head shows no acute findings, but chronic ischemic microvascular white matter disease and minimal chronic left ethmoid sinusitis.  Considering continued symptoms, patient will be transferred to [facility] emergency department for MRI.  Plan to discharge and follow-up with PCP if negative.  I spoke with Dr. Sherry Ruffing at [facility] who accepts patient for transfer.  Patient will be transferred via [facility].  Patient also evaluated by my attending, Dr. Maryan Rued, who guided the patient's management and agrees with plan.  Final Clinical Impressions(s) / ED Diagnoses  Final diagnoses:  Weakness  Cerebrovascular accident (CVA), unspecified mechanism [facility])    ED Discharge Orders         Ordered    pravastatin (PRAVACHOL) 40 MG tablet  Daily at bedtime     02/09/18 1754    Increase activity slowly     02/09/18 1755    Diet - low sodium heart healthy     02/09/18 1755           Frederica Kuster, PA-C 02/08/18 2034    Frederica Kuster, PA-C 02/09/18 1806    Blanchie Dessert, MD 02/10/18 [phone]

## 2018-02-08 NOTE — ED Notes (Signed)
Pt transported via [facility] to [facility], report given to [place], Therapist, sports

## 2018-02-08 NOTE — ED Notes (Signed)
Patient transported to CT 

## 2018-02-08 NOTE — ED Provider Notes (Signed)
Patient transferred from [facility] for right upper extremity weakness and slurred speech.  Patient reports she noticed this around 3:30 AM when she awoke to go to the bathroom.  Patient's daughter reports that she saw her around 4 PM and confirmed that her speech was altered, slow with word finding difficulties.  Friend at bedside reports he spoke to her on the phone earlier in the day and she had specifically slurred speech.  Patient takes Plavix for cardiac stents but has never had a stroke.  CT scan at [facility] was without acute abnormalities.  Patient was transferred here to [facility] for MRI.  Physical Exam  BP 113/62   Pulse 68   Temp 98.2 F (36.8 C) (Oral)   Resp 15   Ht 5\' 3"  (1.6 m)   Wt 49.9 kg   SpO2 93%   BMI 19.49 kg/m   Physical Exam Vitals signs and nursing note reviewed.  Constitutional:      General: She is not in acute distress.    Appearance: She is well-developed.  HENT:     Head: Normocephalic.  Eyes:     General: No scleral icterus.    Conjunctiva/sclera: Conjunctivae normal.  Neck:     Musculoskeletal: Normal range of motion.  Cardiovascular:     Rate and Rhythm: Normal rate.  Pulmonary:     Effort: Pulmonary effort is normal.  Musculoskeletal: Normal range of motion.  Skin:    General: Skin is warm and dry.  Neurological:     Mental Status: She is alert and oriented to person, place, and time.     Comments: Speech is slow, but not specifically slurred 4/5 in the right upper extremity, 5/5 in the right upper extremity and 5/5 in the bilateral lower extremities.     ED Course/Procedures   Clinical Course as of Feb 09 18  Mon Feb 08, 2018  2229 Plan:  MRI ordered.     [HM]  2229 Below previous level of 11.8 (3 years ago), unknown baseline  Hemoglobin(!): 9.5 [HM]  Tue Feb 09, 2018  0017 Discussed with Dr. Alcario Drought who will consult neurology and admit.    [HM]    Clinical Course User Index [HM] Quadry Kampa,  Gwenlyn Perking   Results for orders placed or performed during the hospital encounter of 02/08/18  Protime-INR  Result Value Ref Range   Prothrombin Time 12.8 11.4 - 15.2 seconds   INR 0.97   APTT  Result Value Ref Range   aPTT 31 24 - 36 seconds  CBC  Result Value Ref Range   WBC 9.5 4.0 - 10.5 K/uL   RBC 4.12 3.87 - 5.11 MIL/uL   Hemoglobin 9.5 (L) 12.0 - 15.0 g/dL   HCT 31.8 (L) 36.0 - 46.0 %   MCV 77.2 (L) 80.0 - 100.0 fL   MCH 23.1 (L) 26.0 - 34.0 pg   MCHC 29.9 (L) 30.0 - 36.0 g/dL   RDW 18.3 (H) 11.5 - 15.5 %   Platelets 422 (H) 150 - 400 K/uL   nRBC 0.0 0.0 - 0.2 %  Differential  Result Value Ref Range   Neutrophils Relative % 81 %   Neutro Abs 7.7 1.7 - 7.7 K/uL   Lymphocytes Relative 11 %   Lymphs Abs 1.1 0.7 - 4.0 K/uL   Monocytes Relative 6 %   Monocytes Absolute 0.6 0.1 - 1.0 K/uL   Eosinophils Relative 1 %   Eosinophils Absolute 0.1 0.0 - 0.5  K/uL   Basophils Relative 1 %   Basophils Absolute 0.1 0.0 - 0.1 K/uL   Immature Granulocytes 0 %   Abs Immature Granulocytes 0.04 0.00 - 0.07 K/uL  Comprehensive metabolic panel  Result Value Ref Range   Sodium 137 135 - 145 mmol/L   Potassium 3.6 3.5 - 5.1 mmol/L   Chloride 107 98 - 111 mmol/L   CO2 21 (L) 22 - 32 mmol/L   Glucose, Bld 114 (H) 70 - 99 mg/dL   BUN 18 8 - 23 mg/dL   Creatinine, Ser 0.56 0.44 - 1.00 mg/dL   Calcium 9.9 8.9 - 10.3 mg/dL   Total Protein 7.1 6.5 - 8.1 g/dL   Albumin 3.8 3.5 - 5.0 g/dL   AST 24 15 - 41 U/L   ALT 12 0 - 44 U/L   Alkaline Phosphatase 57 38 - 126 U/L   Total Bilirubin 0.5 0.3 - 1.2 mg/dL   GFR calc non Af Amer >60 >60 mL/min   GFR calc Af Amer >60 >60 mL/min   Anion gap 9 5 - 15  Urinalysis, Routine w reflex microscopic  Result Value Ref Range   Color, Urine YELLOW YELLOW   APPearance CLEAR CLEAR   Specific Gravity, Urine 1.010 1.005 - 1.030   pH 5.5 5.0 - 8.0   Glucose, UA NEGATIVE NEGATIVE mg/dL   Hgb urine dipstick MODERATE (A) NEGATIVE   Bilirubin Urine  NEGATIVE NEGATIVE   Ketones, ur 15 (A) NEGATIVE mg/dL   Protein, ur NEGATIVE NEGATIVE mg/dL   Nitrite NEGATIVE NEGATIVE   Leukocytes, UA NEGATIVE NEGATIVE  Troponin I - ONCE - STAT  Result Value Ref Range   Troponin I <0.03 <0.03 ng/mL  Occult blood card to lab, stool  Result Value Ref Range   Fecal Occult Bld NEGATIVE NEGATIVE  Urinalysis, Microscopic (reflex)  Result Value Ref Range   RBC / HPF 6-10 0 - 5 RBC/hpf   WBC, UA NONE SEEN 0 - 5 WBC/hpf   Bacteria, UA RARE (A) NONE SEEN   Squamous Epithelial / LPF 0-5 0 - 5   Ct Head Wo Contrast  Result Date: 02/08/2018 CLINICAL DATA:  Slurred speech and right-sided weakness. EXAM: CT HEAD WITHOUT CONTRAST TECHNIQUE: Contiguous axial images were obtained from the base of the skull through the vertex without intravenous contrast. COMPARISON:  10/08/2016 FINDINGS: Brain: The brainstem, cerebellum, cerebral peduncles, thalami, basal ganglia, basilar cisterns, and ventricular system appear within normal limits. Periventricular white matter and corona radiata hypodensities favor chronic ischemic microvascular white matter disease. No intracranial hemorrhage, mass lesion, or acute CVA. Vascular: There is atherosclerotic calcification of the cavernous carotid arteries bilaterally. Skull: Unremarkable Sinuses/Orbits: Minimal left chronic ethmoid sinusitis. Other: No supplemental non-categorized findings. IMPRESSION: 1. No acute intracranial findings. 2. Periventricular white matter and corona radiata hypodensities favor chronic ischemic microvascular white matter disease. 3. Minimal chronic left ethmoid sinusitis. Electronically Signed   By: Van Clines M.D.   On: 02/08/2018 18:04   Mr Brain Wo Contrast  Result Date: 02/08/2018 CLINICAL DATA:  Initial evaluation for focal neural deficit, stroke suspected. Right-sided weakness. EXAM: MRI HEAD WITHOUT CONTRAST TECHNIQUE: Multiplanar, multiecho pulse sequences of the brain and surrounding structures  were obtained without intravenous contrast. COMPARISON:  Prior CT from earlier the same day. FINDINGS: Brain: Generalized age-related cerebral atrophy. Mild patchy T2/FLAIR hyperintensity within the periventricular and deep white matter both cerebral hemispheres most consistent with chronic small vessel ischemic disease, mild for age. 11 mm curvilinear focus of restricted diffusion  extending from the posterior left internal capsule towards the left caudate, consistent with a small acute ischemic lacunar type infarct (series 5, image 73). No associated hemorrhage or mass effect. No other evidence for acute or subacute ischemia. No foci of susceptibility artifact to suggest acute or chronic intracranial hemorrhage. No mass lesion, midline shift or mass effect. No hydrocephalus. No extra-axial fluid collection. Pituitary gland within normal limits. Vascular: Major intracranial vascular flow voids maintained. Skull and upper cervical spine: Craniocervical junction within normal limits. No focal marrow replacing lesion. Scalp soft tissues unremarkable. Sinuses/Orbits: Patient status post bilateral ocular lens replacement. Mild scattered mucosal thickening within the ethmoidal air cells. Paranasal sinuses are otherwise clear. No mastoid effusion. Inner ear structures grossly normal. Other: None. IMPRESSION: 1. 11 mm acute ischemic sooner type infarct involving the posterior left internal capsule and caudate. No associated hemorrhage. 2. Underlying age-related cerebral atrophy with mild chronic small vessel ischemic disease. Electronically Signed   By: Jeannine Boga M.D.   On: 02/08/2018 23:35     Procedures  MDM   Patient presents as transfer from [facility] for right upper extremity weakness and slurred speech.  CT scan negative.  Labs largely reassuring.  No evidence of infection.  Patient noted to be anemic however unknown baseline.  Fecal occult negative.  Urinalysis with microscopic  hematuria.  12:13 AM MRI shows 11 mm acute ischemic infarct of the posterior left internal capsule.  Discussed with patient and family.  Patient will need admission.     Cerebrovascular accident (CVA), unspecified mechanism ([place])  Weakness      Payeton Germani, Gwenlyn Perking 02/09/18 0019    Ripley Fraise, MD 02/09/18 [phone]

## 2018-02-08 NOTE — ED Triage Notes (Signed)
Pt states she woke at 3am and "was wobbly"-feels her right leg is weak-per daughter pt "feels jittery and her voice sounds funny"-pt NAD-to triage in w/c

## 2018-02-09 ENCOUNTER — Inpatient Hospital Stay (HOSPITAL_BASED_OUTPATIENT_CLINIC_OR_DEPARTMENT_OTHER): Payer: Medicare Other

## 2018-02-09 ENCOUNTER — Inpatient Hospital Stay (HOSPITAL_COMMUNITY): Payer: Medicare Other

## 2018-02-09 DIAGNOSIS — D509 Iron deficiency anemia, unspecified: Secondary | ICD-10-CM | POA: Diagnosis present

## 2018-02-09 DIAGNOSIS — Z79899 Other long term (current) drug therapy: Secondary | ICD-10-CM | POA: Diagnosis not present

## 2018-02-09 DIAGNOSIS — Z7982 Long term (current) use of aspirin: Secondary | ICD-10-CM | POA: Diagnosis not present

## 2018-02-09 DIAGNOSIS — I739 Peripheral vascular disease, unspecified: Secondary | ICD-10-CM | POA: Diagnosis not present

## 2018-02-09 DIAGNOSIS — I639 Cerebral infarction, unspecified: Secondary | ICD-10-CM | POA: Diagnosis present

## 2018-02-09 DIAGNOSIS — K219 Gastro-esophageal reflux disease without esophagitis: Secondary | ICD-10-CM | POA: Diagnosis present

## 2018-02-09 DIAGNOSIS — Z880 Allergy status to penicillin: Secondary | ICD-10-CM | POA: Diagnosis not present

## 2018-02-09 DIAGNOSIS — Z881 Allergy status to other antibiotic agents status: Secondary | ICD-10-CM | POA: Diagnosis not present

## 2018-02-09 DIAGNOSIS — I370 Nonrheumatic pulmonary valve stenosis: Secondary | ICD-10-CM | POA: Diagnosis not present

## 2018-02-09 DIAGNOSIS — I340 Nonrheumatic mitral (valve) insufficiency: Secondary | ICD-10-CM

## 2018-02-09 DIAGNOSIS — Z720 Tobacco use: Secondary | ICD-10-CM

## 2018-02-09 DIAGNOSIS — E785 Hyperlipidemia, unspecified: Secondary | ICD-10-CM | POA: Diagnosis present

## 2018-02-09 DIAGNOSIS — R531 Weakness: Secondary | ICD-10-CM | POA: Diagnosis not present

## 2018-02-09 DIAGNOSIS — Z7902 Long term (current) use of antithrombotics/antiplatelets: Secondary | ICD-10-CM | POA: Diagnosis not present

## 2018-02-09 DIAGNOSIS — Z888 Allergy status to other drugs, medicaments and biological substances status: Secondary | ICD-10-CM | POA: Diagnosis not present

## 2018-02-09 DIAGNOSIS — I6522 Occlusion and stenosis of left carotid artery: Secondary | ICD-10-CM

## 2018-02-09 DIAGNOSIS — R4781 Slurred speech: Secondary | ICD-10-CM | POA: Diagnosis present

## 2018-02-09 DIAGNOSIS — I10 Essential (primary) hypertension: Secondary | ICD-10-CM | POA: Diagnosis present

## 2018-02-09 DIAGNOSIS — I6381 Other cerebral infarction due to occlusion or stenosis of small artery: Secondary | ICD-10-CM | POA: Diagnosis present

## 2018-02-09 DIAGNOSIS — Z791 Long term (current) use of non-steroidal anti-inflammatories (NSAID): Secondary | ICD-10-CM | POA: Diagnosis not present

## 2018-02-09 DIAGNOSIS — F17210 Nicotine dependence, cigarettes, uncomplicated: Secondary | ICD-10-CM | POA: Diagnosis present

## 2018-02-09 DIAGNOSIS — M810 Age-related osteoporosis without current pathological fracture: Secondary | ICD-10-CM | POA: Diagnosis present

## 2018-02-09 HISTORY — DX: Cerebral infarction, unspecified: I63.9

## 2018-02-09 LAB — RETICULOCYTES
Immature Retic Fract: 23.2 % — ABNORMAL HIGH (ref 2.3–15.9)
RBC.: 4.28 MIL/uL (ref 3.87–5.11)
Retic Count, Absolute: 51.8 10*3/uL (ref 19.0–186.0)
Retic Ct Pct: 1.2 % (ref 0.4–3.1)

## 2018-02-09 LAB — FOLATE: Folate: 49.2 ng/mL (ref >5.9)

## 2018-02-09 LAB — IRON AND TIBC
Iron: 18 ug/dL — ABNORMAL LOW (ref 28–170)
SATURATION RATIOS: 4 % — AB (ref 10.4–31.8)
TIBC: 510 ug/dL — AB (ref 250–450)
UIBC: 492 ug/dL

## 2018-02-09 LAB — FERRITIN: Ferritin: 8 ng/mL — ABNORMAL LOW (ref 11–307)

## 2018-02-09 LAB — LIPID PANEL
Cholesterol: 172 mg/dL (ref 0–200)
HDL: 77 mg/dL (ref >40)
LDL Cholesterol: 80 mg/dL (ref 0–99)
Total CHOL/HDL Ratio: 2.2 RATIO
Triglycerides: 74 mg/dL (ref <150)
VLDL: 15 mg/dL (ref 0–40)

## 2018-02-09 LAB — VITAMIN B12: Vitamin B-12: 682 pg/mL (ref 180–914)

## 2018-02-09 LAB — ECHOCARDIOGRAM COMPLETE
Height: 63 in
Weight: 1760 oz

## 2018-02-09 MED ORDER — ENOXAPARIN SODIUM 40 MG/0.4ML ~~LOC~~ SOLN
40.0000 mg | Freq: Every day | SUBCUTANEOUS | Status: DC
  Administered 2018-02-09: 40 mg via SUBCUTANEOUS
  Filled 2018-02-09: qty 0.4

## 2018-02-09 MED ORDER — PRAVASTATIN SODIUM 40 MG PO TABS: 40.0000 mg | ORAL_TABLET | Freq: Every day | ORAL | 3 refills | Status: DC

## 2018-02-09 MED ORDER — LORATADINE 10 MG PO TABS: 10.0000 mg | ORAL_TABLET | Freq: Every day | ORAL | Status: DC | PRN

## 2018-02-09 MED ORDER — ADULT MULTIVITAMIN W/MINERALS CH
1.0000 | ORAL_TABLET | Freq: Every day | ORAL | Status: DC
  Administered 2018-02-09: 1 via ORAL
  Filled 2018-02-09: qty 1

## 2018-02-09 MED ORDER — STROKE: EARLY STAGES OF RECOVERY BOOK
Freq: Once | Status: DC
  Filled 2018-02-09: qty 1

## 2018-02-09 MED ORDER — ASPIRIN EC 81 MG PO TBEC
81.0000 mg | DELAYED_RELEASE_TABLET | Freq: Every day | ORAL | Status: DC
  Administered 2018-02-09: 81 mg via ORAL
  Filled 2018-02-09: qty 1

## 2018-02-09 MED ORDER — ASPIRIN 325 MG PO TABS: 325.0000 mg | ORAL_TABLET | Freq: Every day | ORAL | Status: DC

## 2018-02-09 MED ORDER — CLOPIDOGREL BISULFATE 75 MG PO TABS
75.0000 mg | ORAL_TABLET | Freq: Every day | ORAL | Status: DC
  Administered 2018-02-09: 75 mg via ORAL
  Filled 2018-02-09: qty 1

## 2018-02-09 MED ORDER — ACETAMINOPHEN 650 MG RE SUPP: 650.0000 mg | RECTAL | Status: DC | PRN

## 2018-02-09 MED ORDER — PRAVASTATIN SODIUM 40 MG PO TABS
40.0000 mg | ORAL_TABLET | Freq: Every day | ORAL | Status: DC
  Administered 2018-02-09: 40 mg via ORAL
  Filled 2018-02-09: qty 1

## 2018-02-09 MED ORDER — ACETAMINOPHEN 325 MG PO TABS: 650.0000 mg | ORAL_TABLET | ORAL | Status: DC | PRN

## 2018-02-09 MED ORDER — EZETIMIBE 10 MG PO TABS
10.0000 mg | ORAL_TABLET | Freq: Every day | ORAL | Status: DC
  Administered 2018-02-09: 10 mg via ORAL
  Filled 2018-02-09: qty 1

## 2018-02-09 MED ORDER — RISEDRONATE SODIUM 5 MG PO TABS: 35.0000 mg | ORAL_TABLET | ORAL | Status: DC

## 2018-02-09 MED ORDER — COLESEVELAM HCL 625 MG PO TABS
625.0000 mg | ORAL_TABLET | Freq: Two times a day (BID) | ORAL | Status: DC
  Administered 2018-02-09: 625 mg via ORAL
  Filled 2018-02-09 (×3): qty 1

## 2018-02-09 MED ORDER — ACETAMINOPHEN 160 MG/5ML PO SOLN: 650.0000 mg | ORAL | Status: DC | PRN

## 2018-02-09 MED ORDER — ASPIRIN 300 MG RE SUPP: 300.0000 mg | Freq: Every day | RECTAL | Status: DC

## 2018-02-09 NOTE — Progress Notes (Signed)
STROKE TEAM PROGRESS NOTE   INTERVAL HISTORY Her family is at the bedside.  She worked with PT and did well this am. OT to work with shortly. She has been smoking, quit one day ago. Discussed stroke risk factors.  Vitals:   02/09/18 0549 02/09/18 0914 02/09/18 1100 02/09/18 1300  BP: (!) 126/51 (!) 139/55 (!) 126/56 138/60  Pulse: 65 69 74 68  Resp: 18 16 18 18   Temp: 98.2 F (36.8 C) 97.6 F (36.4 C) 98 F (36.7 C) 97.7 F (36.5 C)  TempSrc: Oral Oral Oral Oral  SpO2: 98% 92% 93% 93%  Weight:      Height:        CBC:  Recent Labs  Lab 02/08/18 1815  WBC 9.5  NEUTROABS 7.7  HGB 9.5*  HCT 31.8*  MCV 77.2*  PLT 422*    Basic Metabolic Panel:  Recent Labs  Lab 02/08/18 1815  NA 137  K 3.6  CL 107  CO2 21*  GLUCOSE 114*  BUN 18  CREATININE 0.56  CALCIUM 9.9   Lipid Panel:     Component Value Date/Time   CHOL 172 02/09/2018 0950   TRIG 74 02/09/2018 0950   HDL 77 02/09/2018 0950   CHOLHDL 2.2 02/09/2018 0950   VLDL 15 02/09/2018 0950   LDLCALC 80 02/09/2018 0950   HgbA1c: No results found for: HGBA1C Urine Drug Screen: No results found for: LABOPIA, COCAINSCRNUR, LABBENZ, AMPHETMU, THCU, LABBARB  Alcohol Level No results found for: [facility]  IMAGING Dg Chest 2 View  Result Date: 02/09/2018 CLINICAL DATA:  Shortness of breath, cough EXAM: CHEST - 2 VIEW COMPARISON:  04/10/2017 FINDINGS: Right perihilar subsegmental atelectasis. Left lung clear. Heart is normal size. No effusions or acute bony abnormality. IMPRESSION: Right perihilar atelectasis.  No active disease. Electronically Signed   By: Rolm Baptise M.D.   On: 02/09/2018 10:11   Ct Head Wo Contrast  Result Date: 02/08/2018 CLINICAL DATA:  Slurred speech and right-sided weakness. EXAM: CT HEAD WITHOUT CONTRAST TECHNIQUE: Contiguous axial images were obtained from the base of the skull through the vertex without intravenous contrast. COMPARISON:  10/08/2016 FINDINGS: Brain: The brainstem, cerebellum,  cerebral peduncles, thalami, basal ganglia, basilar cisterns, and ventricular system appear within normal limits. Periventricular white matter and corona radiata hypodensities favor chronic ischemic microvascular white matter disease. No intracranial hemorrhage, mass lesion, or acute CVA. Vascular: There is atherosclerotic calcification of the cavernous carotid arteries bilaterally. Skull: Unremarkable Sinuses/Orbits: Minimal left chronic ethmoid sinusitis. Other: No supplemental non-categorized findings. IMPRESSION: 1. No acute intracranial findings. 2. Periventricular white matter and corona radiata hypodensities favor chronic ischemic microvascular white matter disease. 3. Minimal chronic left ethmoid sinusitis. Electronically Signed   By: Van Clines M.D.   On: 02/08/2018 18:04   Mr Brain Wo Contrast  Result Date: 02/08/2018 CLINICAL DATA:  Initial evaluation for focal neural deficit, stroke suspected. Right-sided weakness. EXAM: MRI HEAD WITHOUT CONTRAST TECHNIQUE: Multiplanar, multiecho pulse sequences of the brain and surrounding structures were obtained without intravenous contrast. COMPARISON:  Prior CT from earlier the same day. FINDINGS: Brain: Generalized age-related cerebral atrophy. Mild patchy T2/FLAIR hyperintensity within the periventricular and deep white matter both cerebral hemispheres most consistent with chronic small vessel ischemic disease, mild for age. 11 mm curvilinear focus of restricted diffusion extending from the posterior left internal capsule towards the left caudate, consistent with a small acute ischemic lacunar type infarct (series 5, image 73). No associated hemorrhage or mass effect. No other evidence for acute  or subacute ischemia. No foci of susceptibility artifact to suggest acute or chronic intracranial hemorrhage. No mass lesion, midline shift or mass effect. No hydrocephalus. No extra-axial fluid collection. Pituitary gland within normal limits. Vascular: Major  intracranial vascular flow voids maintained. Skull and upper cervical spine: Craniocervical junction within normal limits. No focal marrow replacing lesion. Scalp soft tissues unremarkable. Sinuses/Orbits: Patient status post bilateral ocular lens replacement. Mild scattered mucosal thickening within the ethmoidal air cells. Paranasal sinuses are otherwise clear. No mastoid effusion. Inner ear structures grossly normal. Other: None. IMPRESSION: 1. 11 mm acute ischemic sooner type infarct involving the posterior left internal capsule and caudate. No associated hemorrhage. 2. Underlying age-related cerebral atrophy with mild chronic small vessel ischemic disease. Electronically Signed   By: Jeannine Boga M.D.   On: 02/08/2018 23:35   Mr Jodene Nam Head Wo Contrast  Result Date: 02/09/2018 CLINICAL DATA:  Right-sided weakness. Acute stroke in the left basal ganglia/white matter tracks. EXAM: MRA HEAD WITHOUT CONTRAST TECHNIQUE: Angiographic images of the Circle of Willis were obtained using MRA technique without intravenous contrast. COMPARISON:  MRI yesterday. FINDINGS: Both internal carotid arteries are widely patent into the brain. No siphon stenosis. The anterior and middle cerebral vessels are patent without proximal stenosis, aneurysm or vascular malformation. Both vertebral arteries are widely patent to the basilar. No basilar stenosis. Posterior circulation branch vessels appear normal. IMPRESSION: Normal examination. Electronically Signed   By: Nelson Chimes M.D.   On: 02/09/2018 08:42   2D echocardiogram Pending  Carotid Doppler Pending   EXAM Exam: NAD, pleasant                  Speech:    Speech is normal; fluent and spontaneous with normal comprehension.  Cognition:    The patient is oriented to person, place, and time;     recent and remote memory intact;     language fluent;    Cranial Nerves:    The pupils are equal, round, and reactive to light.Trigeminal sensation is intact and  the muscles of mastication are normal. The face is symmetric. The palate elevates in the midline. Hearing intact. Voice is normal. Shoulder shrug is normal. The tongue has normal motion without fasciculations.   Coordination:  No dysmetria  Motor Observation:    No asymmetry, no atrophy, and no involuntary movements noted. Tone:    Normal muscle tone.     Strength:    Strength is V/V in the upper and lower limbs.      Sensation: intact to LT     ASSESSMENT/PLAN Madison Richards is a 83 y.o. female with history of HTN, HLD, GERD, L ICA stenosis, tobacco use, GERD who woke w/ RLE weakness as well as change in voice.    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 Small vessel disease.  Chronic left ethmoid sinusitis.  MRI  15mm posterior L IC and caudate infarct. No hmg. Small vessel disease. Atrophy.   MRA  normal  Carotid Doppler  pending   2D Echo  pending   LDL 80  HgbA1c pending   Lovenox 40 mg sq daily for VTE prophylaxis  aspirin 81 mg daily and clopidogrel 75 mg daily prior to admission, now on aspirin 81 mg daily and clopidogrel 75 mg daily. Continue at d/c  Therapy recommendations:  HH PT, OT eval pending   Disposition:  pending   L Carotid Stenosis  Hx L ICA  stenosis  Followed by Dr. Einar Gip - Dr. Jaynee Eagles alerted him she was here at family's request   On aspirin and plavix PTA  Dr. Scot Dock followed for R leg ischemia in 2016  Hypertension  Stable . Permissive hypertension (OK if < 220/120) but gradually normalize in 5-7 days . Long-term BP goal normotensive  Hyperlipidemia  Home meds:  welchol and zetia, resumed in hospital  pravachol 40 added  LDL 80, goal < 70  Continue statin at discharge  Other Stroke Risk Factors  Advanced age  Cigarette smoker, advised to stop smoking - said she just quit the day before yesterday  PAD   Hospital day # 0  This is an 83 year old with  what appears to be lacunar infarct in the left basal ganglia.  Her biggest risk factor at this time is continued smoking, she quit 1 day ago, discussed vascular risk factors including smoking.  At this time she is already on dual antiplatelet therapy I do not think this is a failure of dual antiplatelet therapy but possibly due to her continued smoking and other vascular risk factors.  Discussed with family.  She said she has quit now.  Also discussed case with Dr. Tana Coast who is attending.  She can go home and follow-up outpatient.  We discussed case with her cardiologist Dr. Einar Gip today.  Patient would like to go home.  Work-up is complete.  We will ask her to follow-up with Dr. Einar Gip I have already contacted him and requested a 30-day heart monitor.  Personally examined patient and images, and have participated in and made any corrections needed to history, physical, neuro exam,assessment and plan as stated above.  I have personally obtained the history, evaluated lab date, reviewed imaging studies and agree with radiology interpretations.    Sarina Ill, MD Stroke Neurology   A total of 35 minutes was spent for the care of this patient, spent on counseling patient and family on different diagnostic and therapeutic options, counseling and coordination of care, riskd ans benefits of management, compliance, or risk factor reduction and education.  To contact Stroke Continuity provider, please refer to [email or web address] After hours, contact General Neurology

## 2018-02-09 NOTE — Progress Notes (Signed)
Patient off the floor for ECHO

## 2018-02-09 NOTE — Progress Notes (Signed)
Bilateral carotid duplex completed. Preliminary results can be found in chart review CV Proc. [place] Jashawn Floyd,RVS  02/09/2018 3;32 PM

## 2018-02-09 NOTE — Progress Notes (Signed)
PHARMACIST - PHYSICIAN COMMUNICATION  CONCERNING: P&T Medication Policy Regarding Oral Bisphosphonates  RECOMMENDATION: Your order for risedronate (Actonel) has been discontinued at this time.  If the patient's post-hospital medical condition warrants safe use of this class of drugs, please resume the pre-hospital regimen upon discharge.  DESCRIPTION:  Alendronate (Fosamax), ibandronate (Boniva), and risedronate (Actonel) can cause severe esophageal erosions in patients who are unable to remain upright at least 30 minutes after taking this medication.   Since brief interruptions in therapy are thought to have minimal impact on bone mineral density, the [place] has established that bisphosphonate orders should be routinely discontinued during hospitalization.   To override this safety policy and permit administration of Boniva, Fosamax, or Actonel in the hospital, prescribers must write "DO NOT HOLD" in the comments section when placing the order for this class of medications.

## 2018-02-09 NOTE — Progress Notes (Signed)
Patient back from Echo and VAS.

## 2018-02-09 NOTE — Evaluation (Signed)
SLP Cancellation Note  Patient Details Name: Madison Richards MRN: [id number] DOB: 06-23-1934   Cancelled treatment:       Reason Eval/Treat Not Completed: Other (comment)(PT with pt at this time,will continue efforts)   Macario Golds 02/09/2018, 11:19 AM   Luanna Salk, MS [facility] SLP Acute Rehab Services Pager [phone] Office [phone]

## 2018-02-09 NOTE — Evaluation (Signed)
Physical Therapy Evaluation Patient Details Name: Madison Richards MRN: [id number] DOB: 1934/03/16 Today's Date: 02/09/2018   History of Present Illness  pt is an 83 y/o female with PMH significant for PAD, HTN, L carotic artery stenosis, presenting to ED with R sided weakness.  MRI of the brain confirms L internal capsule acute ischemic infarct.  Clinical Impression  Pt admitted with/for s/s of stroke confirmed on MRI to be in the L internal capsule.  Pt currently limited functionally due to the problems listed below.  (see problems list.)  Pt will benefit from PT to maximize function and safety to be able to get home safely with available assist.     Follow Up Recommendations Home health PT;Other (comment)(may ultimately result in no follow up.)    Equipment Recommendations  None recommended by PT    Recommendations for Other Services       Precautions / Restrictions Precautions Precautions: Fall      Mobility  Bed Mobility Overal bed mobility: Needs Assistance Bed Mobility: Supine to Sit;Sit to Supine     Supine to sit: Independent Sit to supine: Independent      Transfers Overall transfer level: Needs assistance   Transfers: Sit to/from Stand Sit to Stand: Supervision         General transfer comment: safe transfer technique  Ambulation/Gait Ambulation/Gait assistance: Min guard;Min assist Gait Distance (Feet): 200 Feet Assistive device: None Gait Pattern/deviations: Step-through pattern   Gait velocity interpretation: >2.62 ft/sec, indicative of community ambulatory General Gait Details: generally steady with listing R and R drift.  Minor scissor and drift to correct balance.  Stairs            Wheelchair Mobility    Modified Rankin (Stroke Patients Only) Modified Rankin (Stroke Patients Only) Pre-Morbid Rankin Score: No symptoms Modified Rankin: Moderate disability     Balance Overall balance assessment: Needs assistance   Sitting  balance-Leahy Scale: Good     Standing balance support: During functional activity Standing balance-Leahy Scale: Fair Standing balance comment: able to stand statically with AD                             Pertinent Vitals/Pain Pain Assessment: Faces Faces Pain Scale: No hurt Pain Intervention(s): Monitored during session    Home Living Family/patient expects to be discharged to:: Private residence Living Arrangements: Alone Available Help at Discharge: Family;Available PRN/intermittently Type of Home: House Home Access: Level entry     Home Layout: One level Home Equipment: None      Prior Function Level of Independence: Independent         Comments: drives, independent at home     Hand Dominance        Extremity/Trunk Assessment   Upper Extremity Assessment Upper Extremity Assessment: Defer to OT evaluation    Lower Extremity Assessment Lower Extremity Assessment: Overall WFL for tasks assessed(general weakness bil Proximal muscles L weaker than R LE, bu)       Communication   Communication: No difficulties  Cognition Arousal/Alertness: Awake/alert Behavior During Therapy: WFL for tasks assessed/performed Overall Cognitive Status: Within Functional Limits for tasks assessed                                        General Comments General comments (skin integrity, edema, etc.): Discussed BE FAST and risk factors.  Asked  pt and son to consider pt staying with a family member for a few day.s    Exercises     Assessment/Plan    PT Assessment Patient needs continued PT services  PT Problem List Decreased strength;Decreased activity tolerance;Decreased balance       PT Treatment Interventions Gait training;Functional mobility training;Stair training;Therapeutic activities;Balance training;Patient/family education    PT Goals (Current goals can be found in the Care Plan section)  Acute Rehab PT Goals Patient Stated Goal:  home independent PT Goal Formulation: With patient Time For Goal Achievement: 02/16/18 Potential to Achieve Goals: Good    Frequency Min 3X/week   Barriers to discharge        Co-evaluation               AM-PAC PT "6 Clicks" Mobility  Outcome Measure Help needed turning from your back to your side while in a flat bed without using bedrails?: None Help needed moving from lying on your back to sitting on the side of a flat bed without using bedrails?: None Help needed moving to and from a bed to a chair (including a wheelchair)?: None Help needed standing up from a chair using your arms (e.g., wheelchair or bedside chair)?: None Help needed to walk in hospital room?: A Little Help needed climbing 3-5 steps with a railing? : A Little 6 Click Score: 22    End of Session   Activity Tolerance: Patient tolerated treatment well Patient left: in bed;with call bell/phone within reach;with family/visitor present Nurse Communication: Mobility status PT Visit Diagnosis: Unsteadiness on feet (R26.81);Other symptoms and signs involving the nervous system (R29.898)    Time: 1111-1140 PT Time Calculation (min) (ACUTE ONLY): 29 min   Charges:   PT Evaluation $PT Eval Low Complexity: 1 Low PT Treatments $Gait Training: 8-22 mins        02/09/2018  Donnella Sham, PT Acute Rehabilitation Services [phone]  (pager) [phone]  (office)  Madison Richards 02/09/2018, 11:55 AM

## 2018-02-09 NOTE — Progress Notes (Signed)
Patient Arrived back to the floor alert and oriented X 4. Denies pain Bed in the lowest position with bed alarm set. Call light in reach and family at bedside

## 2018-02-09 NOTE — Progress Notes (Signed)
[facility]                                                                              Patient Demographics  Madison Richards, is a 83 y.o. female, DOB - Jun 25, 1934, ZOX:[id number]  Admit date - 02/08/2018   Admitting Physician Madison Quill, DO  Outpatient Primary MD for the patient is Madison Gravel, MD  Outpatient specialists:   LOS - 0  days   Medical records reviewed and are as summarized below:    Chief Complaint  Patient presents with  . Gait Problem       Brief summary   Patient is 83 year old female with history of PAD, hypertension, hyperlipidemia, left carotid artery stenosis presented with right-sided weakness.  Patient reported symptoms onset when she woke up in the morning on 12/23, persisted throughout the day.  MRI confirmed left internal capsule acute ischemic infarct.  Patient was not considered a TPA candidate due to delayed presentation.  Assessment & Plan    Principal Problem:   Acute ischemic stroke [facility]) -Presented with right-sided weakness, still has persistent right-sided weakness -Neurology consulted, MRI of the brain showed 11 mm acute ischemic infarct involving the posterior left internal capsule and caudate, no hemorrhage -MRA brain normal both vertebral arteries are widely patent. -Follow 2D echo, carotid Dopplers, patient and daughter requested consult with Madison Richards as he has been following the carotid Dopplers in the office. -Follow lipid panel, hemoglobin A1c -Currently on aspirin Plavix and pravastatin, will follow recommendations from the stroke team -PT OT evaluation  Active Problems: Mild acute on chronic anemia, asymptomatic -Follow FOBT, anemia panel -Baseline hemoglobin 11-13    HTN (hypertension) -Likely BP stable, hold off on amlodipine valsartan HCTZ, Lopressor due to acute CVA    PAD (peripheral artery disease) (HCC) -Continue aspirin Plavix, statin    Left carotid artery stenosis Follow carotid Dopplers,  Madison Richards consulted as per patient's request  Code Status: Full CODE STATUS DVT Prophylaxis: Lovenox Family Communication: Discussed in detail with the patient, all imaging results, lab results explained to the patient and daughter   Disposition Plan: Stroke work-up in progress  Time Spent in minutes 35 minutes  Procedures:  MRI, MRA brain  Consultants:   Neurology  Antimicrobials:      Medications  Scheduled Meds: .  stroke: mapping our early stages of recovery book   Does not apply Once  . aspirin EC  81 mg Oral Daily  . clopidogrel  75 mg Oral Daily  . colesevelam  625 mg Oral BID WC  . enoxaparin (LOVENOX) injection  40 mg Subcutaneous Daily  . ezetimibe  10 mg Oral Daily  . multivitamin with minerals  1 tablet Oral Daily  . pravastatin  40 mg Oral q1800   Continuous Infusions: PRN Meds:.acetaminophen **OR** acetaminophen (TYLENOL) oral liquid 160 mg/5 mL **OR** acetaminophen, loratadine   Antibiotics   Anti-infectives (From admission, onward)   None        Subjective:   Madison Richards was seen and examined today.  Still has mild right-sided weakness.  No headache, blurry vision or dysarthria. Patient denies dizziness, chest pain, shortness of  breath, abdominal pain, N/V/D/C.  Objective:   Vitals:   02/09/18 0415 02/09/18 0516 02/09/18 0549 02/09/18 0914  BP: (!) 148/80 (!) 131/50 (!) 126/51 (!) 139/55  Pulse: 79 66 65 69  Resp: 16 14 18 16   Temp:   98.2 F (36.8 C) 97.6 F (36.4 C)  TempSrc:   Oral Oral  SpO2: 95% 96% 98% 92%  Weight:      Height:       No intake or output data in the 24 hours ending 02/09/18 1035   Wt Readings from Last 3 Encounters:  02/08/18 49.9 kg  05/26/14 53.1 kg  03/08/13 55.8 kg     Exam  General: Alert and oriented x 3, NAD patient fluent, no dysarthria  Eyes:   HEENT:    Cardiovascular: S1 S2 auscultated, Regular rate and rhythm.  Respiratory: Clear to auscultation bilaterally, no wheezing, rales or  rhonchi  Gastrointestinal: Soft, nontender, nondistended, + bowel sounds  Ext: no pedal edema bilaterally  Neuro: Strength 5/5 in the left upper and lower extremities, right 4/5  Musculoskeletal: No digital cyanosis, clubbing  Skin: No rashes  Psych: Normal affect and demeanor, alert and oriented x3    Data Reviewed:  I have personally reviewed following labs and imaging studies  Micro Results No results found for this or any previous visit (from the past 240 hour(s)).  Radiology Reports Dg Chest 2 View  Result Date: 02/09/2018 CLINICAL DATA:  Shortness of breath, cough EXAM: CHEST - 2 VIEW COMPARISON:  04/10/2017 FINDINGS: Right perihilar subsegmental atelectasis. Left lung clear. Heart is normal size. No effusions or acute bony abnormality. IMPRESSION: Right perihilar atelectasis.  No active disease. Electronically Signed   By: Rolm Baptise M.D.   On: 02/09/2018 10:11   Ct Head Wo Contrast  Result Date: 02/08/2018 CLINICAL DATA:  Slurred speech and right-sided weakness. EXAM: CT HEAD WITHOUT CONTRAST TECHNIQUE: Contiguous axial images were obtained from the base of the skull through the vertex without intravenous contrast. COMPARISON:  10/08/2016 FINDINGS: Brain: The brainstem, cerebellum, cerebral peduncles, thalami, basal ganglia, basilar cisterns, and ventricular system appear within normal limits. Periventricular white matter and corona radiata hypodensities favor chronic ischemic microvascular white matter disease. No intracranial hemorrhage, mass lesion, or acute CVA. Vascular: There is atherosclerotic calcification of the cavernous carotid arteries bilaterally. Skull: Unremarkable Sinuses/Orbits: Minimal left chronic ethmoid sinusitis. Other: No supplemental non-categorized findings. IMPRESSION: 1. No acute intracranial findings. 2. Periventricular white matter and corona radiata hypodensities favor chronic ischemic microvascular white matter disease. 3. Minimal chronic left  ethmoid sinusitis. Electronically Signed   By: Van Clines M.D.   On: 02/08/2018 18:04   Mr Brain Wo Contrast  Result Date: 02/08/2018 CLINICAL DATA:  Initial evaluation for focal neural deficit, stroke suspected. Right-sided weakness. EXAM: MRI HEAD WITHOUT CONTRAST TECHNIQUE: Multiplanar, multiecho pulse sequences of the brain and surrounding structures were obtained without intravenous contrast. COMPARISON:  Prior CT from earlier the same day. FINDINGS: Brain: Generalized age-related cerebral atrophy. Mild patchy T2/FLAIR hyperintensity within the periventricular and deep white matter both cerebral hemispheres most consistent with chronic small vessel ischemic disease, mild for age. 11 mm curvilinear focus of restricted diffusion extending from the posterior left internal capsule towards the left caudate, consistent with a small acute ischemic lacunar type infarct (series 5, image 73). No associated hemorrhage or mass effect. No other evidence for acute or subacute ischemia. No foci of susceptibility artifact to suggest acute or chronic intracranial hemorrhage. No mass lesion, midline shift or mass  effect. No hydrocephalus. No extra-axial fluid collection. Pituitary gland within normal limits. Vascular: Major intracranial vascular flow voids maintained. Skull and upper cervical spine: Craniocervical junction within normal limits. No focal marrow replacing lesion. Scalp soft tissues unremarkable. Sinuses/Orbits: Patient status post bilateral ocular lens replacement. Mild scattered mucosal thickening within the ethmoidal air cells. Paranasal sinuses are otherwise clear. No mastoid effusion. Inner ear structures grossly normal. Other: None. IMPRESSION: 1. 11 mm acute ischemic sooner type infarct involving the posterior left internal capsule and caudate. No associated hemorrhage. 2. Underlying age-related cerebral atrophy with mild chronic small vessel ischemic disease. Electronically Signed   By:  Jeannine Boga M.D.   On: 02/08/2018 23:35   Mr Jodene Nam Head Wo Contrast  Result Date: 02/09/2018 CLINICAL DATA:  Right-sided weakness. Acute stroke in the left basal ganglia/white matter tracks. EXAM: MRA HEAD WITHOUT CONTRAST TECHNIQUE: Angiographic images of the Circle of Willis were obtained using MRA technique without intravenous contrast. COMPARISON:  MRI yesterday. FINDINGS: Both internal carotid arteries are widely patent into the brain. No siphon stenosis. The anterior and middle cerebral vessels are patent without proximal stenosis, aneurysm or vascular malformation. Both vertebral arteries are widely patent to the basilar. No basilar stenosis. Posterior circulation branch vessels appear normal. IMPRESSION: Normal examination. Electronically Signed   By: Nelson Chimes M.D.   On: 02/09/2018 08:42    Lab Data:  CBC: Recent Labs  Lab 02/08/18 1815  WBC 9.5  NEUTROABS 7.7  HGB 9.5*  HCT 31.8*  MCV 77.2*  PLT 856*   Basic Metabolic Panel: Recent Labs  Lab 02/08/18 1815  NA 137  K 3.6  CL 107  CO2 21*  GLUCOSE 114*  BUN 18  CREATININE 0.56  CALCIUM 9.9   GFR: Estimated Creatinine Clearance: 43.4 mL/min (by C-G formula based on SCr of 0.56 mg/dL). Liver Function Tests: Recent Labs  Lab 02/08/18 1815  AST 24  ALT 12  ALKPHOS 57  BILITOT 0.5  PROT 7.1  ALBUMIN 3.8   No results for input(s): LIPASE, AMYLASE in the last 168 hours. No results for input(s): AMMONIA in the last 168 hours. Coagulation Profile: Recent Labs  Lab 02/08/18 1815  INR 0.97   Cardiac Enzymes: Recent Labs  Lab 02/08/18 1815  TROPONINI <0.03   BNP (last 3 results) No results for input(s): PROBNP in the last 8760 hours. HbA1C: No results for input(s): HGBA1C in the last 72 hours. CBG: No results for input(s): GLUCAP in the last 168 hours. Lipid Profile: No results for input(s): CHOL, HDL, LDLCALC, TRIG, CHOLHDL, LDLDIRECT in the last 72 hours. Thyroid Function Tests: No results  for input(s): TSH, T4TOTAL, FREET4, T3FREE, THYROIDAB in the last 72 hours. Anemia Panel: No results for input(s): VITAMINB12, FOLATE, FERRITIN, TIBC, IRON, RETICCTPCT in the last 72 hours. Urine analysis:    Component Value Date/Time   COLORURINE YELLOW 02/08/2018 1733   APPEARANCEUR CLEAR 02/08/2018 1733   LABSPEC 1.010 02/08/2018 1733   PHURINE 5.5 02/08/2018 1733   GLUCOSEU NEGATIVE 02/08/2018 1733   HGBUR MODERATE (A) 02/08/2018 1733   BILIRUBINUR NEGATIVE 02/08/2018 1733   KETONESUR 15 (A) 02/08/2018 1733   PROTEINUR NEGATIVE 02/08/2018 1733   UROBILINOGEN 0.2 05/27/2014 0430   NITRITE NEGATIVE 02/08/2018 1733   LEUKOCYTESUR NEGATIVE 02/08/2018 1733     Ripudeep Rai M.D. [facility] 02/09/2018, 10:35 AM  Pager: [phone] Between 7am to 7pm - call Pager - [phone]  After 7pm go to [email or web address] - password [email or web address]  Call night coverage person covering after  7pm

## 2018-02-09 NOTE — Progress Notes (Signed)
Nurse went over discharge with patient and family. patient and family verbalized understanding of discharge. Discharging home with all belongs taking down in a wheel chair.

## 2018-02-09 NOTE — Evaluation (Signed)
Occupational Therapy Evaluation Patient Details Name: Madison Richards MRN: [id number] DOB: August 24, 1934 Today's Date: 02/09/2018    History of Present Illness pt is an 83 y/o female with PMH significant for PAD, HTN, L carotic artery stenosis, presenting to ED with R sided weakness.  MRI of the brain confirms L internal capsule acute ischemic infarct.   Clinical Impression   This 83 y/o female presents with the above. At baseline pt is independent with ADL, iADL and functional mobility. Pt performing room and hallway level functional mobility without AD and overall close minguard assist, occasional light minA due to mild unsteadiness though with no overt LOB noted. Pt with increased fatigue after room/hallway activity requiring standing and seated rest break during session. She currently requires setup assist for UB ADL, minguard assist for LB and standing grooming ADLs. Pt reports she lives alone but reports has family who can stay/check in initially after discharge. She will benefit from continued OT services in both acute and post acute settings to maximize her safety and independence with ADLs and mobility. Will follow.     Follow Up Recommendations  Home health OT;Supervision/Assistance - 24 hour(24hr initially)    Equipment Recommendations  None recommended by OT           Precautions / Restrictions Precautions Precautions: Fall Restrictions Weight Bearing Restrictions: No      Mobility Bed Mobility Overal bed mobility: Needs Assistance Bed Mobility: Supine to Sit;Sit to Supine     Supine to sit: Independent Sit to supine: Independent      Transfers Overall transfer level: Needs assistance   Transfers: Sit to/from Stand Sit to Stand: Supervision         General transfer comment: safe transfer technique    Balance Overall balance assessment: Needs assistance   Sitting balance-Leahy Scale: Good     Standing balance support: During functional  activity Standing balance-Leahy Scale: Fair Standing balance comment: able to stand statically with AD                           ADL either performed or assessed with clinical judgement   ADL Overall ADL's : Needs assistance/impaired Eating/Feeding: Modified independent;Sitting   Grooming: Wash/dry hands;Wash/dry face;Oral care;Min guard;Minimal assistance;Standing   Upper Body Bathing: Min guard;Sitting   Lower Body Bathing: Min guard;Sit to/from stand   Upper Body Dressing : Set up;Sitting   Lower Body Dressing: Min guard;Sit to/from stand   Toilet Transfer: Min guard;Minimal assistance;Ambulation;Regular Glass blower/designer Details (indicate cue type and reason): simulated in transfer to/from recliner Toileting- Water quality scientist and Hygiene: Min guard;Sit to/from stand       Functional mobility during ADLs: Min guard;Minimal assistance General ADL Comments: pt with increased fatigue after room and hallway mobility requiring x1 standing and seated rest break; HR up to 123 with activity      Vision Baseline Vision/History: Wears glasses Wears Glasses: At all times Patient Visual Report: No change from baseline Vision Assessment?: No apparent visual deficits     Perception     Praxis      Pertinent Vitals/Pain Pain Assessment: No/denies pain     Hand Dominance Right   Extremity/Trunk Assessment Upper Extremity Assessment Upper Extremity Assessment: Generalized weakness   Lower Extremity Assessment Lower Extremity Assessment: Defer to PT evaluation       Communication Communication Communication: No difficulties   Cognition Arousal/Alertness: Awake/alert Behavior During Therapy: WFL for tasks assessed/performed Overall Cognitive Status: No family/caregiver present  to determine baseline cognitive functioning                                 General Comments: noted some word finding difficulties    General Comments   discussed potential need to have family member stay initially to provide 24hr supervision/assist. pt and family verbalizing understanding    Exercises     Shoulder Instructions      Home Living Family/patient expects to be discharged to:: Private residence Living Arrangements: Alone Available Help at Discharge: Family;Available PRN/intermittently(daughter may be able to stay 24hr initially if needed) Type of Home: House Home Access: Level entry     Home Layout: One level     Bathroom Shower/Tub: Occupational psychologist: Handicapped height     Home Equipment: None          Prior Functioning/Environment Level of Independence: Independent        Comments: drives, independent at home        OT Problem List: Decreased strength;Decreased range of motion;Decreased activity tolerance;Impaired balance (sitting and/or standing)      OT Treatment/Interventions: Self-care/ADL training;Therapeutic exercise;Energy conservation;Therapeutic activities;Patient/family education;Balance training    OT Goals(Current goals can be found in the care plan section) Acute Rehab OT Goals Patient Stated Goal: home independent OT Goal Formulation: With patient Time For Goal Achievement: 02/23/18 Potential to Achieve Goals: Good  OT Frequency: Min 2X/week   Barriers to D/C:            Co-evaluation              AM-PAC OT "6 Clicks" Daily Activity     Outcome Measure Help from another person eating meals?: None Help from another person taking care of personal grooming?: None Help from another person toileting, which includes using toliet, bedpan, or urinal?: A Little Help from another person bathing (including washing, rinsing, drying)?: A Little Help from another person to put on and taking off regular upper body clothing?: None Help from another person to put on and taking off regular lower body clothing?: A Little 6 Click Score: 21   End of Session Equipment  Utilized During Treatment: Gait belt Nurse Communication: Mobility status  Activity Tolerance: Patient tolerated treatment well Patient left: in bed;with call bell/phone within reach;with bed alarm set;with family/visitor present  OT Visit Diagnosis: Unsteadiness on feet (R26.81);Muscle weakness (generalized) (M62.81)                Time: 1610-9604 OT Time Calculation (min): 21 min Charges:  OT General Charges $OT Visit: 1 Visit OT Evaluation $OT Eval Moderate Complexity: 1 Mod  Lou Cal, OT [employer] Pager [phone] Office [phone]   Raymondo Band 02/09/2018, 3:59 PM

## 2018-02-09 NOTE — Discharge Summary (Addendum)
Physician Discharge Summary   Patient ID: Madison Richards MRN: [id number] DOB/AGE: 10-13-1934 83 y.o.  Admit date: 02/08/2018 Discharge date: 02/09/2018  Primary Care Physician:  Jani Gravel, MD   Recommendations for Outpatient Follow-up:  1. Follow up with PCP in 1-2 weeks 2. Patient was placed on pravastatin 40 mg daily, obtain LFTs, CK in a week   Home Health: Home health PT OT Equipment/Devices:   Discharge Condition: stable  CODE STATUS: FULL   Diet recommendation: Heart healthy diet   Discharge Diagnoses:    . Acute ischemic stroke ([place]) . HTN (hypertension) . PAD (peripheral artery disease) ([place]) . Left carotid artery stenosis Nicotine use Hyperlipidemia  Consults: Neurology/stroke service Cardiology    Allergies:   Allergies  Allergen Reactions  . Clindamycin/Lincomycin Rash    Rash in mouth  . Neosporin [Neomycin-Polymyxin-Gramicidin] Rash  . Penicillins Rash    Rash in mouth     DISCHARGE MEDICATIONS: Allergies as of 02/09/2018      Reactions   Clindamycin/lincomycin Rash   Rash in mouth   Neosporin [neomycin-polymyxin-gramicidin] Rash   Penicillins Rash   Rash in mouth      Medication List    STOP taking these medications   LIVALO 2 MG Tabs Generic drug:  Pitavastatin Calcium     TAKE these medications   aspirin EC 81 MG tablet Take 81 mg by mouth daily.   CALCIUM 600 + D PO Take 1 tablet by mouth 2 (two) times daily.   celecoxib 200 MG capsule Commonly known as:  CELEBREX Take 200 mg by mouth daily as needed for mild pain.   clopidogrel 75 MG tablet Commonly known as:  PLAVIX Take 1 tablet (75 mg total) by mouth daily.   colesevelam 625 MG tablet Commonly known as:  WELCHOL Take 625 mg by mouth 2 (two) times daily with a meal.   EXFORGE HCT 5-160-12.5 MG Tabs Generic drug:  amLODIPine-Valsartan-HCTZ Take 1 tablet by mouth daily.   ezetimibe 10 MG tablet Commonly known as:  ZETIA Take 10 mg by mouth daily.    loratadine 10 MG tablet Commonly known as:  CLARITIN Take 10 mg by mouth daily as needed for allergies.   metoprolol tartrate 25 MG tablet Commonly known as:  LOPRESSOR Take 25 mg by mouth 2 (two) times daily.   multivitamin with minerals Tabs tablet Take 1 tablet by mouth daily.   pravastatin 40 MG tablet Commonly known as:  PRAVACHOL Take 1 tablet (40 mg total) by mouth at bedtime.   risedronate 35 MG tablet Commonly known as:  ACTONEL Take 35 mg by mouth every 7 (seven) days. with water on empty stomach, nothing by mouth or lie down for next 30 minutes.   Vitamin D3 125 MCG (5000 UT) Tabs Take 1 tablet by mouth every Monday, Wednesday, and Friday.        Brief H and P: For complete details please refer to admission H and P, but in brief **Patient is 83 year old female with history of PAD, hypertension, hyperlipidemia, left carotid artery stenosis presented with right-sided weakness.  Patient reported symptoms onset when she woke up in the morning on 12/23, persisted throughout the day.  MRI confirmed left internal capsule acute ischemic infarct.  Patient was not considered a TPA candidate due to delayed presentation.  Hospital Course:  Acute ischemic stroke [facility]) -Patient presented with presented with right-sided weakness which is now improving -Neurology was consulted, MRI of the brain showed 11 mm acute ischemic infarct involving the  posterior left internal capsule and caudate, no hemorrhage -MRA brain normal both vertebral arteries are widely patent. -2D echo showed EF of 60 to 65% with grade 2 diastolic dysfunction, patient follows Dr. Einar Gip as her cardiologist, recommended to follow-up outpatient.   -Carotid Dopplers showed 1 to 39% ICA stenosis bilaterally -Follow lipid panel, hemoglobin A1c -Currently on aspirin Plavix and pravastatin, per neurology, continue aspirin and Plavix, patient was strongly recommended to quit smoking -PT OT evaluation recommended home health  PT Patient and her family insisted on leaving due to Christmas eve, patient also feels that right-sided weakness is improving to the point that she can function at home and spend Christmas with her family.  Will have case management arrange home health PT at home.  This was discussed with patient's daughter in detail, who requested discharge home.   Mild acute on chronic anemia, asymptomatic -Anemia panel consistent with iron deficiency anemia, recommend outpatient work-up -Baseline hemoglobin 11-13    HTN (hypertension) -Antihypertensives were held secondary to acute CVA, patient may resume at discharge.    PAD (peripheral artery disease) (HCC) -Continue aspirin Plavix, statin    Left carotid artery stenosis Carotid Doppler showed 1 to 39% stenosis bilaterally   Day of Discharge S: Right-sided weakness improving, wants to go home  BP 138/65 (BP Location: Left Arm)   Pulse 71   Temp (!) 97.5 F (36.4 C) (Oral)   Resp 16   Ht 5\' 3"  (1.6 m)   Wt 49.9 kg   SpO2 93%   BMI 19.49 kg/m   Physical Exam: General: Alert and awake oriented x3 not in any acute distress. HEENT: anicteric sclera, pupils reactive to light and accommodation CVS: S1-S2 clear no murmur rubs or gallops Chest: clear to auscultation bilaterally, no wheezing rales or rhonchi Abdomen: soft nontender, nondistended, normal bowel sounds Extremities: no cyanosis, clubbing or edema noted bilaterally Neuro: Mild right-sided weakness   The results of significant diagnostics from this hospitalization (including imaging, microbiology, ancillary and laboratory) are listed below for reference.      Procedures/Studies: 2D echo Study Conclusions  - Left ventricle: The cavity size was normal. There was mild   concentric hypertrophy. Systolic function was normal. The   estimated ejection fraction was in the range of 60% to 65%. Wall   motion was normal; there were no regional wall motion   abnormalities.  Features are consistent with a pseudonormal left   ventricular filling pattern, with concomitant abnormal relaxation   and increased filling pressure (grade 2 diastolic dysfunction).   Doppler parameters are consistent with high ventricular filling   pressure. - Aortic valve: Transvalvular velocity was within the normal range.   There was no stenosis. There was no regurgitation. - Mitral valve: Transvalvular velocity was within the normal range.   There was no evidence for stenosis. There was mild regurgitation. - Left atrium: The atrium was mildly dilated. - Right ventricle: The cavity size was normal. Wall thickness was   normal. Systolic function was normal. - Atrial septum: No defect or patent foramen ovale was identified   by color flow Doppler. - Tricuspid valve: There was no regurgitation. - Pulmonary arteries: Systolic pressure was within the normal   range. PA peak pressure: 35 mm Hg (S).   Dg Chest 2 View  Result Date: 02/09/2018 CLINICAL DATA:  Shortness of breath, cough EXAM: CHEST - 2 VIEW COMPARISON:  04/10/2017 FINDINGS: Right perihilar subsegmental atelectasis. Left lung clear. Heart is normal size. No effusions or acute bony abnormality.  IMPRESSION: Right perihilar atelectasis.  No active disease. Electronically Signed   By: Rolm Baptise M.D.   On: 02/09/2018 10:11   Ct Head Wo Contrast  Result Date: 02/08/2018 CLINICAL DATA:  Slurred speech and right-sided weakness. EXAM: CT HEAD WITHOUT CONTRAST TECHNIQUE: Contiguous axial images were obtained from the base of the skull through the vertex without intravenous contrast. COMPARISON:  10/08/2016 FINDINGS: Brain: The brainstem, cerebellum, cerebral peduncles, thalami, basal ganglia, basilar cisterns, and ventricular system appear within normal limits. Periventricular white matter and corona radiata hypodensities favor chronic ischemic microvascular white matter disease. No intracranial hemorrhage, mass lesion, or acute CVA.  Vascular: There is atherosclerotic calcification of the cavernous carotid arteries bilaterally. Skull: Unremarkable Sinuses/Orbits: Minimal left chronic ethmoid sinusitis. Other: No supplemental non-categorized findings. IMPRESSION: 1. No acute intracranial findings. 2. Periventricular white matter and corona radiata hypodensities favor chronic ischemic microvascular white matter disease. 3. Minimal chronic left ethmoid sinusitis. Electronically Signed   By: Van Clines M.D.   On: 02/08/2018 18:04   Mr Brain Wo Contrast  Result Date: 02/08/2018 CLINICAL DATA:  Initial evaluation for focal neural deficit, stroke suspected. Right-sided weakness. EXAM: MRI HEAD WITHOUT CONTRAST TECHNIQUE: Multiplanar, multiecho pulse sequences of the brain and surrounding structures were obtained without intravenous contrast. COMPARISON:  Prior CT from earlier the same day. FINDINGS: Brain: Generalized age-related cerebral atrophy. Mild patchy T2/FLAIR hyperintensity within the periventricular and deep white matter both cerebral hemispheres most consistent with chronic small vessel ischemic disease, mild for age. 11 mm curvilinear focus of restricted diffusion extending from the posterior left internal capsule towards the left caudate, consistent with a small acute ischemic lacunar type infarct (series 5, image 73). No associated hemorrhage or mass effect. No other evidence for acute or subacute ischemia. No foci of susceptibility artifact to suggest acute or chronic intracranial hemorrhage. No mass lesion, midline shift or mass effect. No hydrocephalus. No extra-axial fluid collection. Pituitary gland within normal limits. Vascular: Major intracranial vascular flow voids maintained. Skull and upper cervical spine: Craniocervical junction within normal limits. No focal marrow replacing lesion. Scalp soft tissues unremarkable. Sinuses/Orbits: Patient status post bilateral ocular lens replacement. Mild scattered mucosal  thickening within the ethmoidal air cells. Paranasal sinuses are otherwise clear. No mastoid effusion. Inner ear structures grossly normal. Other: None. IMPRESSION: 1. 11 mm acute ischemic sooner type infarct involving the posterior left internal capsule and caudate. No associated hemorrhage. 2. Underlying age-related cerebral atrophy with mild chronic small vessel ischemic disease. Electronically Signed   By: Jeannine Boga M.D.   On: 02/08/2018 23:35   Mr Jodene Nam Head Wo Contrast  Result Date: 02/09/2018 CLINICAL DATA:  Right-sided weakness. Acute stroke in the left basal ganglia/white matter tracks. EXAM: MRA HEAD WITHOUT CONTRAST TECHNIQUE: Angiographic images of the Circle of Willis were obtained using MRA technique without intravenous contrast. COMPARISON:  MRI yesterday. FINDINGS: Both internal carotid arteries are widely patent into the brain. No siphon stenosis. The anterior and middle cerebral vessels are patent without proximal stenosis, aneurysm or vascular malformation. Both vertebral arteries are widely patent to the basilar. No basilar stenosis. Posterior circulation branch vessels appear normal. IMPRESSION: Normal examination. Electronically Signed   By: Nelson Chimes M.D.   On: 02/09/2018 08:42   Vas US Carotid (at [place] Only)  Result Date: 02/09/2018 Carotid Arterial Duplex Study Indications: CVA. Performing Technologist: Toma Copier RVS  Examination Guidelines: A complete evaluation includes B-mode imaging, spectral Doppler, color Doppler, and power Doppler as needed of all accessible portions of  each vessel. Bilateral testing is considered an integral part of a complete examination. Limited examinations for reoccurring indications may be performed as noted.  Right Carotid Findings: +----------+--------+--------+--------+--------------------+-------------------+           PSV cm/sEDV cm/sStenosisDescribe            Comments             +----------+--------+--------+--------+--------------------+-------------------+ CCA Prox  101     12                                  minimal intimal                                                           thickening          +----------+--------+--------+--------+--------------------+-------------------+ CCA Distal101     10                                                      +----------+--------+--------+--------+--------------------+-------------------+ ICA Prox  68      9       1-39%   irregular and       far wall at the                                       heterogenous        origin              +----------+--------+--------+--------+--------------------+-------------------+ ICA Mid   115     16                                                      +----------+--------+--------+--------+--------------------+-------------------+ ICA Distal111     20                                                      +----------+--------+--------+--------+--------------------+-------------------+ ECA       148     7               irregular and       moderate at the                                       heterogenous        origin              +----------+--------+--------+--------+--------------------+-------------------+ +----------+--------+-------+--------+-------------------+           PSV cm/sEDV cmsDescribeArm Pressure (mmHG) +----------+--------+-------+--------+-------------------+ [id number]                                        +----------+--------+-------+--------+-------------------+ +---------+--------+--+--------+--+  VertebralPSV cm/s78EDV cm/s15 +---------+--------+--+--------+--+  Left Carotid Findings: +----------+--------+--------+--------+---------------------+------------------+           PSV cm/sEDV cm/sStenosisDescribe             Comments            +----------+--------+--------+--------+---------------------+------------------+ CCA Prox  86      13                                   mild intimal                                                              changes            +----------+--------+--------+--------+---------------------+------------------+ CCA Distal75      10              heterogenous and                                                          focal                                   +----------+--------+--------+--------+---------------------+------------------+ ICA Prox  102     14      1-39%   heterogenous         on the far wall                                                           with acoustic                                                             shadowing          +----------+--------+--------+--------+---------------------+------------------+ ICA Mid   80      14                                                      +----------+--------+--------+--------+---------------------+------------------+ ICA Distal92      19                                                      +----------+--------+--------+--------+---------------------+------------------+ ECA       217     25              heterogenous and  moderate plaque                                      irregular                               +----------+--------+--------+--------+---------------------+------------------+ +----------+--------+--------+--------+-------------------+ SubclavianPSV cm/sEDV cm/sDescribeArm Pressure (mmHG) +----------+--------+--------+--------+-------------------+           188                                         +----------+--------+--------+--------+-------------------+ +---------+--------+--+--------+-+------------+ VertebralPSV cm/s44EDV cm/s8" to - fro " +---------+--------+--+--------+-+------------+  Summary: Right Carotid: Velocities in the  right ICA are consistent with a 1-39% stenosis. Left Carotid: Velocities in the left ICA are consistent with a 1-39% stenosis. Vertebrals:  Right vertebral artery demonstrates antegrade flow. "Bunny " sign              on the left. Subclavians: Left subclavian artery flow was disturbed. Normal flow hemodynamics              were seen in the right subclavian artery. *See table(s) above for measurements and observations.     Preliminary        LAB RESULTS: Basic Metabolic Panel: Recent Labs  Lab 02/08/18 1815  NA 137  K 3.6  CL 107  CO2 21*  GLUCOSE 114*  BUN 18  CREATININE 0.56  CALCIUM 9.9   Liver Function Tests: Recent Labs  Lab 02/08/18 1815  AST 24  ALT 12  ALKPHOS 57  BILITOT 0.5  PROT 7.1  ALBUMIN 3.8   No results for input(s): LIPASE, AMYLASE in the last 168 hours. No results for input(s): AMMONIA in the last 168 hours. CBC: Recent Labs  Lab 02/08/18 1815  WBC 9.5  NEUTROABS 7.7  HGB 9.5*  HCT 31.8*  MCV 77.2*  PLT 422*   Cardiac Enzymes: Recent Labs  Lab 02/08/18 1815  TROPONINI <0.03   BNP: Invalid input(s): POCBNP CBG: No results for input(s): GLUCAP in the last 168 hours.    Disposition and Follow-up: Discharge Instructions    Diet - low sodium heart healthy   Complete by:  As directed    Increase activity slowly   Complete by:  As directed        DISPOSITION: Home with home health   [place]    Melvenia Beam, MD. Schedule an appointment as soon as possible for a visit in 4 week(s).   Specialty:  Neurology Contact information: [address] [place] [address] [phone]        Jani Gravel, MD. Schedule an appointment as soon as possible for a visit in 2 week(s).   Specialty:  Internal Medicine Contact information: [address] [place] [address] [phone]            Time coordinating discharge:  25 minutes  Signed:   Estill Cotta  M.D. [facility] 02/09/2018, [place] PM Pager: [phone]

## 2018-02-09 NOTE — Consult Note (Signed)
Referring Physician: Dr. Alcario Drought    Chief Complaint: RLE weakness  HPI: Madison Richards is an 83 y.o. female who presented to the ED with a sensation of being wobbly in addition to RLE weakness upon awakening Monday morning at 3 AM. Her daughter noticed slurred speech and a change to her mother's voice as well. An MRI brain was obtained, revealing an acute left internal capsule ischemic infarction.   Her PMHx includes HLD and HTN.    Past Medical History:  Diagnosis Date  . Generalized headaches    due to neck pain  . GERD (gastroesophageal reflux disease)   . Hyperlipidemia   . Hypertension   . Neck pain   . Osteoporosis     Past Surgical History:  Procedure Laterality Date  . ABDOMINAL ANGIOGRAM Bilateral 03/02/2012   Procedure: ABDOMINAL ANGIOGRAM;  Surgeon: Laverda Page, MD;  Location: [facility] CATH LAB;  Service: Cardiovascular;  Laterality: Bilateral;  . ABDOMINAL ANGIOGRAM  04/06/2012   Procedure: ABDOMINAL ANGIOGRAM;  Surgeon: Laverda Page, MD;  Location: [facility] CATH LAB;  Service: Cardiovascular;;  . abdominal tumor     removed in 1968/benign  . APPENDECTOMY    . ILIAC ARTERY STENT     right side 2003 and 2014    Family History  Problem Relation Age of Onset  . Heart disease Sister   . Heart disease Brother   . Heart disease Brother   . Colon cancer Neg Hx    Social History:  reports that she has been smoking cigarettes. She has never used smokeless tobacco. She reports current alcohol use. She reports that she does not use drugs.  Allergies:  Allergies  Allergen Reactions  . Clindamycin/Lincomycin Rash    Rash in mouth  . Neosporin [Neomycin-Polymyxin-Gramicidin] Rash  . Penicillins Rash    Rash in mouth    Home Medications: Current Meds  Medication Sig  . Amlodipine-Valsartan-HCTZ (EXFORGE HCT) 5-160-12.5 MG TABS Take 1 tablet by mouth daily.  Marland Kitchen aspirin EC 81 MG tablet Take 81 mg by mouth daily.  . Calcium Carbonate-Vitamin D (CALCIUM 600 + D PO) Take  1 tablet by mouth 2 (two) times daily.  . celecoxib (CELEBREX) 200 MG capsule Take 200 mg by mouth daily as needed for mild pain.   . Cholecalciferol (VITAMIN D3) 5000 UNITS TABS Take 1 tablet by mouth every Monday, Wednesday, and Friday.  . clopidogrel (PLAVIX) 75 MG tablet Take 1 tablet (75 mg total) by mouth daily.  . colesevelam (WELCHOL) 625 MG tablet Take 625 mg by mouth 2 (two) times daily with a meal.   . ezetimibe (ZETIA) 10 MG tablet Take 10 mg by mouth daily.  Marland Kitchen loratadine (CLARITIN) 10 MG tablet Take 10 mg by mouth daily as needed for allergies.   . metoprolol tartrate (LOPRESSOR) 25 MG tablet Take 25 mg by mouth 2 (two) times daily.  . Multiple Vitamin (MULTIVITAMIN WITH MINERALS) TABS Take 1 tablet by mouth daily.  . Pitavastatin Calcium (LIVALO) 2 MG TABS Take 1 tablet by mouth 4 (four) times a week. Monday, Wednesday, Friday and Saturday  . risedronate (ACTONEL) 35 MG tablet Take 35 mg by mouth every 7 (seven) days. with water on empty stomach, nothing by mouth or lie down for next 30 minutes.    ROS: Denies vision changes, N/V, headache, CP, SOB or headache.   Physical Examination: Blood pressure (!) 148/80, pulse 79, temperature 98.2 F (36.8 C), temperature source Oral, resp. rate 16, height 5\' 3"  (1.6 m),  weight 49.9 kg, SpO2 95 %.  HEENT: [place]/AT Lungs: Respirations unlabored Ext: No edema  Neurologic Examination: Mental Status: Alert, fully oriented, thought content appropriate.  Speech fluent without evidence of aphasia.  Able to follow all commands without difficulty. Cranial Nerves: II:  Visual fields grossly normal.  III,IV, VI: No ptosis. EOMI V,VII: smile symmetric, facial temp sensation intact bilaterally VIII: hearing intact to voice IX,X: No hypophonia XI: Symmetric XII: midline tongue extension  Motor: RUE 4/5 grip, biceps and deltoid RLE 4+/5 LUE and LLE 5/5 Normal tone and bulk x 4 Sensory: Temp and light touch intact bilaterally in upper and  lower extremities. No extinction.  Deep Tendon Reflexes:  2+ left biceps, brachioradialis and patella 3+ right biceps, brachioradialis and patella Cerebellar: No ataxia with FNF bilaterally Gait: Deferred   Results for orders placed or performed during the hospital encounter of 02/08/18 (from the past 48 hour(s))  Urinalysis, Routine w reflex microscopic     Status: Abnormal   Collection Time: 02/08/18  5:33 PM  Result Value Ref Range   Color, Urine YELLOW YELLOW   APPearance CLEAR CLEAR   Specific Gravity, Urine 1.010 1.005 - 1.030   pH 5.5 5.0 - 8.0   Glucose, UA NEGATIVE NEGATIVE mg/dL   Hgb urine dipstick MODERATE (A) NEGATIVE   Bilirubin Urine NEGATIVE NEGATIVE   Ketones, ur 15 (A) NEGATIVE mg/dL   Protein, ur NEGATIVE NEGATIVE mg/dL   Nitrite NEGATIVE NEGATIVE   Leukocytes, UA NEGATIVE NEGATIVE    Comment: Performed at [facility], [address] [place], [place] [address]  Urinalysis, Microscopic (reflex)     Status: Abnormal   Collection Time: 02/08/18  5:33 PM  Result Value Ref Range   RBC / HPF 6-10 0 - 5 RBC/hpf   WBC, UA NONE SEEN 0 - 5 WBC/hpf   Bacteria, UA RARE (A) NONE SEEN   Squamous Epithelial / LPF 0-5 0 - 5    Comment: Performed at [facility], [address] [place], [place] [address]  Protime-INR     Status: None   Collection Time: 02/08/18  6:15 PM  Result Value Ref Range   Prothrombin Time 12.8 11.4 - 15.2 seconds   INR 0.97     Comment: Performed at [facility], [address] [place], [place] [address]  APTT     Status: None   Collection Time: 02/08/18  6:15 PM  Result Value Ref Range   aPTT 31 24 - 36 seconds    Comment: Performed at [facility], [address] [place], [place] [address]  CBC     Status: Abnormal   Collection Time: 02/08/18  6:15 PM  Result Value Ref Range   WBC 9.5 4.0 - 10.5 K/uL   RBC 4.12 3.87 - 5.11 MIL/uL   Hemoglobin 9.5 (L) 12.0 - 15.0 g/dL   HCT 31.8 (L) 36.0 -  46.0 %   MCV 77.2 (L) 80.0 - 100.0 fL   MCH 23.1 (L) 26.0 - 34.0 pg   MCHC 29.9 (L) 30.0 - 36.0 g/dL   RDW 18.3 (H) 11.5 - 15.5 %   Platelets 422 (H) 150 - 400 K/uL   nRBC 0.0 0.0 - 0.2 %    Comment: Performed at [facility], [address] [place], [place] [address]  Differential     Status: None   Collection Time: 02/08/18  6:15 PM  Result Value Ref Range  Neutrophils Relative % 81 %   Neutro Abs 7.7 1.7 - 7.7 K/uL   Lymphocytes Relative 11 %   Lymphs Abs 1.1 0.7 - 4.0 K/uL   Monocytes Relative 6 %   Monocytes Absolute 0.6 0.1 - 1.0 K/uL   Eosinophils Relative 1 %   Eosinophils Absolute 0.1 0.0 - 0.5 K/uL   Basophils Relative 1 %   Basophils Absolute 0.1 0.0 - 0.1 K/uL   Immature Granulocytes 0 %   Abs Immature Granulocytes 0.04 0.00 - 0.07 K/uL    Comment: Performed at [facility], [address] [place], [place] [address]  Comprehensive metabolic panel     Status: Abnormal   Collection Time: 02/08/18  6:15 PM  Result Value Ref Range   Sodium 137 135 - 145 mmol/L   Potassium 3.6 3.5 - 5.1 mmol/L   Chloride 107 98 - 111 mmol/L   CO2 21 (L) 22 - 32 mmol/L   Glucose, Bld 114 (H) 70 - 99 mg/dL   BUN 18 8 - 23 mg/dL   Creatinine, Ser 0.56 0.44 - 1.00 mg/dL   Calcium 9.9 8.9 - 10.3 mg/dL   Total Protein 7.1 6.5 - 8.1 g/dL   Albumin 3.8 3.5 - 5.0 g/dL   AST 24 15 - 41 U/L   ALT 12 0 - 44 U/L   Alkaline Phosphatase 57 38 - 126 U/L   Total Bilirubin 0.5 0.3 - 1.2 mg/dL   GFR calc non Af Amer >60 >60 mL/min   GFR calc Af Amer >60 >60 mL/min   Anion gap 9 5 - 15    Comment: Performed at [facility], [address] [place], [place] [address]  Troponin I - ONCE - STAT     Status: None   Collection Time: 02/08/18  6:15 PM  Result Value Ref Range   Troponin I <0.03 <0.03 ng/mL    Comment: Performed at [facility], [address] [place], [place] [address]  Occult blood card to lab, stool     Status: None   Collection Time:  02/08/18  7:22 PM  Result Value Ref Range   Fecal Occult Bld NEGATIVE NEGATIVE    Comment: Performed at [facility], [address], [place], [place] [address]   Ct Head Wo Contrast  Result Date: 02/08/2018 CLINICAL DATA:  Slurred speech and right-sided weakness. EXAM: CT HEAD WITHOUT CONTRAST TECHNIQUE: Contiguous axial images were obtained from the base of the skull through the vertex without intravenous contrast. COMPARISON:  10/08/2016 FINDINGS: Brain: The brainstem, cerebellum, cerebral peduncles, thalami, basal ganglia, basilar cisterns, and ventricular system appear within normal limits. Periventricular white matter and corona radiata hypodensities favor chronic ischemic microvascular white matter disease. No intracranial hemorrhage, mass lesion, or acute CVA. Vascular: There is atherosclerotic calcification of the cavernous carotid arteries bilaterally. Skull: Unremarkable Sinuses/Orbits: Minimal left chronic ethmoid sinusitis. Other: No supplemental non-categorized findings. IMPRESSION: 1. No acute intracranial findings. 2. Periventricular white matter and corona radiata hypodensities favor chronic ischemic microvascular white matter disease. 3. Minimal chronic left ethmoid sinusitis. Electronically Signed   By: Van Clines M.D.   On: 02/08/2018 18:04   Mr Brain Wo Contrast  Result Date: 02/08/2018 CLINICAL DATA:  Initial evaluation for focal neural deficit, stroke suspected. Right-sided weakness. EXAM: MRI HEAD WITHOUT CONTRAST TECHNIQUE: Multiplanar, multiecho pulse sequences of the brain and surrounding structures were obtained without intravenous contrast. COMPARISON:  Prior CT from earlier the same  day. FINDINGS: Brain: Generalized age-related cerebral atrophy. Mild patchy T2/FLAIR hyperintensity within the periventricular and deep white matter both cerebral hemispheres most consistent with chronic small vessel ischemic disease, mild for age. 11 mm curvilinear focus of  restricted diffusion extending from the posterior left internal capsule towards the left caudate, consistent with a small acute ischemic lacunar type infarct (series 5, image 73). No associated hemorrhage or mass effect. No other evidence for acute or subacute ischemia. No foci of susceptibility artifact to suggest acute or chronic intracranial hemorrhage. No mass lesion, midline shift or mass effect. No hydrocephalus. No extra-axial fluid collection. Pituitary gland within normal limits. Vascular: Major intracranial vascular flow voids maintained. Skull and upper cervical spine: Craniocervical junction within normal limits. No focal marrow replacing lesion. Scalp soft tissues unremarkable. Sinuses/Orbits: Patient status post bilateral ocular lens replacement. Mild scattered mucosal thickening within the ethmoidal air cells. Paranasal sinuses are otherwise clear. No mastoid effusion. Inner ear structures grossly normal. Other: None. IMPRESSION: 1. 11 mm acute ischemic sooner type infarct involving the posterior left internal capsule and caudate. No associated hemorrhage. 2. Underlying age-related cerebral atrophy with mild chronic small vessel ischemic disease. Electronically Signed   By: Jeannine Boga M.D.   On: 02/08/2018 23:35    Assessment: 83 y.o. female presenting with acute right sided weakness.  1. MRI reveals a small acute ischemic lacunar infarction involving the posterior left internal capsule and caudate. 2. Exam reveals mild right sided weakness and right sided hyperreflexia 3. Stroke Risk Factors - HLD and HTN.   Plan: 1. HgbA1c, fasting lipid panel 2. MRA of the brain without contrast 3. PT consult, OT consult, Speech consult 4. Echocardiogram 5. Carotid dopplers 6. Prophylactic therapy-Continue ASA and Plavix 7. Stroke team to assess whether her pitavastatin should be changed to atorvastatin 8. Telemetry monitoring 9. Frequent neuro checks 10. BP management  @Electronically   signed: Dr. Kerney Elbe 02/09/2018, 4:34 AM

## 2018-02-09 NOTE — Progress Notes (Signed)
Patient off the floor for Test.

## 2018-02-09 NOTE — H&P (Signed)
History and Physical    BRIEANNE Richards DXI:[id number] DOB: 06/15/1934 DOA: 02/08/2018  PCP: Madison Gravel, MD  Patient coming from: Home  I have personally briefly reviewed patient's old medical records in [place]  Chief Complaint: R sided weakness  HPI: Madison Richards is a 83 y.o. female with medical history significant of PAD, HTN, HLD, L carotid artery stenosis followed by Dr. Einar Richards.  Patient presents to the ED with c/o R sided weakness.  Symptoms onset when she woke up in the morning on 12/23.  Persisted throughout the day.  Due to persistent symptoms she presented to the ED in the Evening.  Not a code stroke nor TPA candidate due to extensive time since symptom onset.   ED Course: Symptoms persistent and unchanged in ED.  MRI brain confirms L internal capsule acute ischemic infarct.   Review of Systems: As per HPI otherwise 10 point review of systems negative.   Past Medical History:  Diagnosis Date  . Generalized headaches    due to neck pain  . GERD (gastroesophageal reflux disease)   . Hyperlipidemia   . Hypertension   . Neck pain   . Osteoporosis     Past Surgical History:  Procedure Laterality Date  . ABDOMINAL ANGIOGRAM Bilateral 03/02/2012   Procedure: ABDOMINAL ANGIOGRAM;  Surgeon: Laverda Page, MD;  Location: [facility] CATH LAB;  Service: Cardiovascular;  Laterality: Bilateral;  . ABDOMINAL ANGIOGRAM  04/06/2012   Procedure: ABDOMINAL ANGIOGRAM;  Surgeon: Laverda Page, MD;  Location: [facility] CATH LAB;  Service: Cardiovascular;;  . abdominal tumor     removed in 1968/benign  . APPENDECTOMY    . ILIAC ARTERY STENT     right side 2003 and 2014     reports that she has been smoking cigarettes. She has never used smokeless tobacco. She reports current alcohol use. She reports that she does not use drugs.  Allergies  Allergen Reactions  . Clindamycin/Lincomycin Rash    Rash in mouth  . Neosporin [Neomycin-Polymyxin-Gramicidin] Rash  . Penicillins Rash     Rash in mouth    Family History  Problem Relation Age of Onset  . Heart disease Sister   . Heart disease Brother   . Heart disease Brother   . Colon cancer Neg Hx      Prior to Admission medications   Medication Sig Start Date End Date Taking? Authorizing Provider  Amlodipine-Valsartan-HCTZ (EXFORGE HCT) 5-160-12.5 MG TABS Take 1 tablet by mouth daily.   Yes [provider]  aspirin EC 81 MG tablet Take 81 mg by mouth daily.   Yes [provider]  Calcium Carbonate-Vitamin D (CALCIUM 600 + D PO) Take 1 tablet by mouth 2 (two) times daily.   Yes [provider]  celecoxib (CELEBREX) 200 MG capsule Take 200 mg by mouth daily as needed for mild pain.    Yes [provider]  Cholecalciferol (VITAMIN D3) 5000 UNITS TABS Take 1 tablet by mouth every Monday, Wednesday, and Friday.   Yes [provider]  clopidogrel (PLAVIX) 75 MG tablet Take 1 tablet (75 mg total) by mouth daily. 05/30/14  Yes Rhyne, Hulen Shouts, PA-C  colesevelam (WELCHOL) 625 MG tablet Take 625 mg by mouth 2 (two) times daily with a meal.    Yes [provider]  ezetimibe (ZETIA) 10 MG tablet Take 10 mg by mouth daily.   Yes [provider]  loratadine (CLARITIN) 10 MG tablet Take 10 mg by mouth daily as needed  for allergies.    Yes [provider]  metoprolol tartrate (LOPRESSOR) 25 MG tablet Take 25 mg by mouth 2 (two) times daily.   Yes [provider]  Multiple Vitamin (MULTIVITAMIN WITH MINERALS) TABS Take 1 tablet by mouth daily.   Yes [provider]  Pitavastatin Calcium (LIVALO) 2 MG TABS Take 1 tablet by mouth 4 (four) times a week. Monday, Wednesday, Friday and Saturday   Yes [provider]  risedronate (ACTONEL) 35 MG tablet Take 35 mg by mouth every 7 (seven) days. with water on empty stomach, nothing by mouth or lie down for next 30 minutes.   Yes [provider]    Physical Exam: Vitals:   02/09/18  0200 02/09/18 0415 02/09/18 0516 02/09/18 0549  BP: 117/88 (!) 148/80 (!) 131/50 (!) 126/51  Pulse: 66 79 66 65  Resp: 19 16 14 18   Temp:    98.2 F (36.8 C)  TempSrc:    Oral  SpO2: 98% 95% 96% 98%  Weight:      Height:        Constitutional: NAD, calm, comfortable Eyes: PERRL, lids and conjunctivae normal ENMT: Mucous membranes are moist. Posterior pharynx clear of any exudate or lesions.Normal dentition.  Neck: normal, supple, no masses, no thyromegaly Respiratory: clear to auscultation bilaterally, no wheezing, no crackles. Normal respiratory effort. No accessory muscle use.  Cardiovascular: Regular rate and rhythm, no murmurs / rubs / gallops. No extremity edema. 2+ pedal pulses. No carotid bruits.  Abdomen: no tenderness, no masses palpated. No hepatosplenomegaly. Bowel sounds positive.  Musculoskeletal: no clubbing / cyanosis. No joint deformity upper and lower extremities. Good ROM, no contractures. Normal muscle tone.  Skin: no rashes, lesions, ulcers. No induration Neurologic: 4/5 on RUE, RLE, 5/5 on L, fluent speech, smile symetric. Psychiatric: Normal judgment and insight. Alert and oriented x 3. Normal mood.    Labs on Admission: I have personally reviewed following labs and imaging studies  CBC: Recent Labs  Lab 02/08/18 1815  WBC 9.5  NEUTROABS 7.7  HGB 9.5*  HCT 31.8*  MCV 77.2*  PLT 284*   Basic Metabolic Panel: Recent Labs  Lab 02/08/18 1815  NA 137  K 3.6  CL 107  CO2 21*  GLUCOSE 114*  BUN 18  CREATININE 0.56  CALCIUM 9.9   GFR: Estimated Creatinine Clearance: 43.4 mL/min (by C-G formula based on SCr of 0.56 mg/dL). Liver Function Tests: Recent Labs  Lab 02/08/18 1815  AST 24  ALT 12  ALKPHOS 57  BILITOT 0.5  PROT 7.1  ALBUMIN 3.8   No results for input(s): LIPASE, AMYLASE in the last 168 hours. No results for input(s): AMMONIA in the last 168 hours. Coagulation Profile: Recent Labs  Lab 02/08/18 1815  INR 0.97   Cardiac  Enzymes: Recent Labs  Lab 02/08/18 1815  TROPONINI <0.03   BNP (last 3 results) No results for input(s): PROBNP in the last 8760 hours. HbA1C: No results for input(s): HGBA1C in the last 72 hours. CBG: No results for input(s): GLUCAP in the last 168 hours. Lipid Profile: No results for input(s): CHOL, HDL, LDLCALC, TRIG, CHOLHDL, LDLDIRECT in the last 72 hours. Thyroid Function Tests: No results for input(s): TSH, T4TOTAL, FREET4, T3FREE, THYROIDAB in the last 72 hours. Anemia Panel: No results for input(s): VITAMINB12, FOLATE, FERRITIN, TIBC, IRON, RETICCTPCT in the last 72 hours. Urine analysis:    Component Value Date/Time   COLORURINE YELLOW 02/08/2018 [place] 02/08/2018 1733  LABSPEC 1.010 02/08/2018 1733   PHURINE 5.5 02/08/2018 1733   GLUCOSEU NEGATIVE 02/08/2018 1733   HGBUR MODERATE (A) 02/08/2018 1733   BILIRUBINUR NEGATIVE 02/08/2018 1733   KETONESUR 15 (A) 02/08/2018 1733   PROTEINUR NEGATIVE 02/08/2018 1733   UROBILINOGEN 0.2 05/27/2014 0430   NITRITE NEGATIVE 02/08/2018 1733   LEUKOCYTESUR NEGATIVE 02/08/2018 1733    Radiological Exams on Admission: Ct Head Wo Contrast  Result Date: 02/08/2018 CLINICAL DATA:  Slurred speech and right-sided weakness. EXAM: CT HEAD WITHOUT CONTRAST TECHNIQUE: Contiguous axial images were obtained from the base of the skull through the vertex without intravenous contrast. COMPARISON:  10/08/2016 FINDINGS: Brain: The brainstem, cerebellum, cerebral peduncles, thalami, basal ganglia, basilar cisterns, and ventricular system appear within normal limits. Periventricular white matter and corona radiata hypodensities favor chronic ischemic microvascular white matter disease. No intracranial hemorrhage, mass lesion, or acute CVA. Vascular: There is atherosclerotic calcification of the cavernous carotid arteries bilaterally. Skull: Unremarkable Sinuses/Orbits: Minimal left chronic ethmoid sinusitis. Other: No supplemental  non-categorized findings. IMPRESSION: 1. No acute intracranial findings. 2. Periventricular white matter and corona radiata hypodensities favor chronic ischemic microvascular white matter disease. 3. Minimal chronic left ethmoid sinusitis. Electronically Signed   By: Van Clines M.D.   On: 02/08/2018 18:04   Mr Brain Wo Contrast  Result Date: 02/08/2018 CLINICAL DATA:  Initial evaluation for focal neural deficit, stroke suspected. Right-sided weakness. EXAM: MRI HEAD WITHOUT CONTRAST TECHNIQUE: Multiplanar, multiecho pulse sequences of the brain and surrounding structures were obtained without intravenous contrast. COMPARISON:  Prior CT from earlier the same day. FINDINGS: Brain: Generalized age-related cerebral atrophy. Mild patchy T2/FLAIR hyperintensity within the periventricular and deep white matter both cerebral hemispheres most consistent with chronic small vessel ischemic disease, mild for age. 11 mm curvilinear focus of restricted diffusion extending from the posterior left internal capsule towards the left caudate, consistent with a small acute ischemic lacunar type infarct (series 5, image 73). No associated hemorrhage or mass effect. No other evidence for acute or subacute ischemia. No foci of susceptibility artifact to suggest acute or chronic intracranial hemorrhage. No mass lesion, midline shift or mass effect. No hydrocephalus. No extra-axial fluid collection. Pituitary gland within normal limits. Vascular: Major intracranial vascular flow voids maintained. Skull and upper cervical spine: Craniocervical junction within normal limits. No focal marrow replacing lesion. Scalp soft tissues unremarkable. Sinuses/Orbits: Patient status post bilateral ocular lens replacement. Mild scattered mucosal thickening within the ethmoidal air cells. Paranasal sinuses are otherwise clear. No mastoid effusion. Inner ear structures grossly normal. Other: None. IMPRESSION: 1. 11 mm acute ischemic sooner type  infarct involving the posterior left internal capsule and caudate. No associated hemorrhage. 2. Underlying age-related cerebral atrophy with mild chronic small vessel ischemic disease. Electronically Signed   By: Jeannine Boga M.D.   On: 02/08/2018 23:35    EKG: Independently reviewed.  Assessment/Plan Principal Problem:   Acute ischemic stroke [facility]) Active Problems:   HTN (hypertension)   PAD (peripheral artery disease) (HCC)   Left carotid artery stenosis    1. Acute ischemic stroke - 1. Stroke pathway 2. Neuro consult, Dr. Cheral Marker note in chart 3. ASA and plavix for now 4. Tele monitor 5. 2d echo 6. Carotid dopplers.  Also Dr. Tana Coast has text paged Dr. Einar Richards to see if we can get info regarding the extent of the diagnosed L carotid artery stenosis. 7. MRA brain 8. A1C, FLP 9. Cont home statin for the moment, Stroke team to decide if they want to change this. 2.  HTN - 1. Hold home BP meds, allow permissive HTN  DVT prophylaxis: Lovenox Code Status: Full Family Communication: Family at bedside Disposition Plan: Home after admit Consults called: Neuro Admission status: Admit to inpatient  Severity of Illness: The appropriate patient status for this patient is INPATIENT. Inpatient status is judged to be reasonable and necessary in order to provide the required intensity of service to ensure the patient's safety. The patient's presenting symptoms, physical exam findings, and initial radiographic and laboratory data in the context of their chronic comorbidities is felt to place them at high risk for further clinical deterioration. Furthermore, it is not anticipated that the patient will be medically stable for discharge from the hospital within 2 midnights of admission. The following factors support the patient status of inpatient.   " The patient's presenting symptoms include R sided weakness. " The worrisome physical exam findings include R sided weakness. " The initial  radiographic and laboratory data are worrisome because of Confirmed acute ischemic stroke in L internal capsule. " The chronic co-morbidities include HTN, HLD, PAD, L Carotid stenosis of unclear severity.   * I certify that at the point of admission it is my clinical judgment that the patient will require inpatient hospital care spanning beyond 2 midnights from the point of admission due to high intensity of service, high risk for further deterioration and high frequency of surveillance required.Etta Quill DO [facility] Pager [phone] Only works nights!  If 7AM-7PM, please contact the primary day team physician taking care of patient  [email or web address] Password [email or web address]  02/09/2018, 7:01 AM

## 2018-02-10 LAB — HEMOGLOBIN A1C
Hgb A1c MFr Bld: 5.8 % — ABNORMAL HIGH (ref 4.8–5.6)
Mean Plasma Glucose: 120 mg/dL

## 2018-02-12 NOTE — Care Management (Signed)
02/12/2018---pt discharged late on 02/09/2018 with orders for [facility] services. These were not arranged at that time. CM called Mrs Castles today and provided her choice. [place] selected. Drew with [facility] notified and accepted the referral. They will reach out to the patient either today or tomorrow per Dian Situ.

## 2018-02-15 ENCOUNTER — Other Ambulatory Visit: Payer: Self-pay | Admitting: *Deleted

## 2018-02-15 NOTE — Patient Outreach (Signed)
[place] [facility]) Care Management  02/15/2018  IYA HAMED [birth year] [id number]   EMMI- stroke  RED ON EMMI ALERT Day #  1 Date:  Sat 02/13/18 1001 Day # 1  Red Alert Reason: Problems setting up rehab? Yes    Insurance: united health care medicare  [facility] admissions x 1  ED visits x 1 in the last 6 months    Outreach attempt # 1 No answer. [facility] RN CM left HIPAA compliant voicemail message along with CM's contact info.   Plan: [facility] RN CM sent an unsuccessful outreach letter and scheduled this patient for another call attempt within 4 business days  Noach Calvillo L. Lavina Hamman, RN, BSN, [place] Coordinator Office number [phone] Mobile number [phone]  Main [facility] number [phone] Fax number [phone]

## 2018-02-18 ENCOUNTER — Other Ambulatory Visit: Payer: Self-pay | Admitting: *Deleted

## 2018-02-18 NOTE — Patient Outreach (Signed)
[facility]) Care Management  02/18/2018  JOLYNN BAJOREK 1934/04/26 [id number]   EMMI- stroke  RED ON EMMI ALERT Day #   1 Date:  Sat 02/13/18 1001 Day # 1  Red Alert Reason: Problems setting up rehab? Yes    Insurance: united health care medicare  [facility] admissions x 1  ED visits x 1 in the last 6 months    Outreach attempt # 2 No answer. [facility] RN CM left HIPAA compliant voicemail message along with CM's contact info.   Plan: [facility] RN CM  scheduled this patient for the last call attempt within 4 business days  Kimberly L. Lavina Hamman, RN, BSN, [place] Coordinator Office number [phone] Mobile number [phone]  Main [facility] number [phone] Fax number [phone]

## 2018-02-19 ENCOUNTER — Other Ambulatory Visit: Payer: Self-pay | Admitting: *Deleted

## 2018-02-19 NOTE — Patient Outreach (Signed)
EMMI- stroke  RED ON EMMI ALERT Day #1 Date:Sat 12/28/191001 Day # 1  Red Alert Reason:Problems setting up rehab? Yes   Insurance:united health care medicare [facility] visits x 1in the last 6 months    Patient returned a call to [facility] RN CM Patient is able to verify HIPAA Reviewed and addressed EMMI red alert/referral to [facility] with patient  She states at the time of the EMMI call 02/13/18 the answer was correct as yes but since that time she has been seen and evaluated by [place] on 02/16/18 who confirmed she would be seen twice a week by [facility] PT/OT  She is still pending a call back for the first day of therapy but believes the delay is related to the New year holiday Madison Richards informs [facility] RN CM she will contact [facility] RN CM if she does not receive a call for services She reports she is not worried about the start of services at this time and is doing very well    Social: Madison Richards is widowed and has support of her daughter. Her daughter assists with transportation. Madison Richards is independent to assist with all care.She confirms she does not need DME for mobility and denies any falls    Conditions: acute ischemic CVA, HTN, PAD, Left carotid stenosis,    Medications: denies concerns with taking medications as prescribed, affording medications, side effects of medications and questions about medications  Appointments: Has an appointment to see primary care Kathyrn Lass on 03/02/2018 and neurologist NP on 03/19/2018  Advance Directives: States she does not need assist with advance directive  Consent: [facility] RN CM reviewed [facility] services with patient. Patient gave verbal consent for services.     Plan [facility] RN CM will close case at this time as patient has been assessed and no needs identified.   Pt encouraged to return a call to [facility] RN CM prn   Nichlos Kunzler L. Lavina Hamman, RN, BSN, [place] Coordinator Office  number [phone] Mobile number [phone]  Main [facility] number [phone] Fax number [phone]

## 2018-02-23 ENCOUNTER — Other Ambulatory Visit: Payer: Self-pay | Admitting: *Deleted

## 2018-02-23 NOTE — Patient Outreach (Signed)
[place] [facility]) Care Management  02/23/2018  LUCCIA REINHEIMER Jan 11, 1935 [id number]   Opened in error   Joelene Millin L. Lavina Hamman, RN, BSN, [place] Coordinator Office number [phone] Mobile number [phone]  Main [facility] number [phone] Fax number [phone]

## 2018-02-26 ENCOUNTER — Other Ambulatory Visit: Payer: Self-pay | Admitting: *Deleted

## 2018-02-26 NOTE — Patient Outreach (Signed)
[facility] [facility]) Care Management  02/26/2018  [name] 02-Aug-1934 [id number]   EMMI- stroke  RED ON EMMI ALERT Day # 13 Date: Thursday 02/25/2018  1007  Red Alert Reason: went to follow-up appointment? No    Insurance: united health care medicare   [facility] admissions x 1 ED visits x 1 in the last 6 months   Pt person come one more OT came and dismiss her   Outreach attempt #1 Successful at the home number  Patient is able to verify HIPAA [place] Management RN reviewed and addressed red alert with patient [name] reports she is doing well  She confirms the arrive of her home health services since our call on 02/19/2018 She reports she has been evaluated by [facility] OT and "dismissed" She reports being evaluated by [facility] PT and informed she has one more upcoming visit  EMMI She confirms again her upcoming appointments - primary care Kathyrn Lass on 03/02/2018 and neurologist NP on 03/19/2018  EMMI answer on 02/25/2018 was correct as no because her appointments are scheduled after the 02/25/2018 date of the EMMI question  Cm discussed the importance and purpose of follow up with primary and/or specialist She voices appreciation for the call from CM and states she will call CM prn   Consent: [facility] RN CM reviewed [facility] services with patient. Patient gave verbal consent for services.   Advised patient that there will be further automated EMMI- post discharge calls to assess how the patient is doing following the recent hospitalization Advised the patient that another call may be received from a nurse if any of their responses were abnormal. Patient voiced understanding and was appreciative of f/u call.   Plan: [facility] RN CM will close case at this time as patient has been assessed and no needs identified.     [name] L. Lavina Hamman, RN, BSN, [place] Coordinator Office number [phone] Mobile number [phone]  Main [facility] number [phone] Fax number  [phone]

## 2018-03-12 ENCOUNTER — Other Ambulatory Visit: Payer: Self-pay | Admitting: Family Medicine

## 2018-03-12 DIAGNOSIS — Z1231 Encounter for screening mammogram for malignant neoplasm of breast: Secondary | ICD-10-CM

## 2018-03-19 ENCOUNTER — Encounter: Payer: Self-pay | Admitting: Adult Health

## 2018-03-19 ENCOUNTER — Ambulatory Visit: Payer: Medicare Other | Admitting: Adult Health

## 2018-03-19 VITALS — BP 140/66 | HR 57 | Ht 64.0 in | Wt 115.2 lb

## 2018-03-19 DIAGNOSIS — I639 Cerebral infarction, unspecified: Secondary | ICD-10-CM

## 2018-03-19 DIAGNOSIS — E785 Hyperlipidemia, unspecified: Secondary | ICD-10-CM

## 2018-03-19 DIAGNOSIS — I6522 Occlusion and stenosis of left carotid artery: Secondary | ICD-10-CM | POA: Diagnosis not present

## 2018-03-19 DIAGNOSIS — Z87891 Personal history of nicotine dependence: Secondary | ICD-10-CM

## 2018-03-19 DIAGNOSIS — I10 Essential (primary) hypertension: Secondary | ICD-10-CM

## 2018-03-19 NOTE — Progress Notes (Signed)
[facility] [address] [place]. [place] [address] [phone]       OFFICE FOLLOW UP NOTE  [name] Date of Birth:  03/15/1934 Medical Record Number:  [id number]   Reason for Referral:  hospital stroke follow up  CHIEF COMPLAINT:  Chief Complaint  Patient presents with  . Follow-up    Stroke hospital follow up room in back hallway  pt alone    HPI: Madison Richards is being seen today for initial visit in the office for left basal ganglia infarct secondary to small vessel disease source on 02/08/2018. History obtained from patient and chart review. Reviewed all radiology images and labs personally.  [name] is a 84 y.o. female with history of HTN, HLD, GERD, L ICA stenosis, tobacco use, GERD who was admitted with RLE weakness as well as change in voice.    CT head reviewed was negative for acute infarct but did show small vessel disease and chronic left ethmoid sinusitis.  MRI brain reviewed and showed 11 mm posterior left ICA quadrant infarct without evidence of hemorrhage.  MRA normal.  Carotid ultrasound showed bilateral ICA 1 to 39% stenosis with right vertebral artery demonstrating antegrade flow and left vertebral artery showing "funny" sign and left subclavian artery flow was disturbed with normal flow hemodynamics seen in the right subclavian artery.  2D echo showed an EF of 60 to 65% without evidence of PFO.  LDL 80 and A1c 5.8.  Patient previously on aspirin 81 mg and Plavix 75 mg PTA and recommended continuation at discharge due to history of left carotid stenosis with continued follow-up with Dr. Einar Gip.  HTN stable during admission recommended long-term BP goal normotensive range.  Recommended continuation of WelChol and Zetia for HDL management.  Other stroke risk factors include advanced age, tobacco use and PAD.  She was evaluated by Dr. Jaynee Eagles during admission and it was felt as though infarct not related to failure of DAPT but likely due  to continued tobacco use and other vascular risk factors.  It was recommended to undergo 30-day cardiac event monitor and Dr. Einar Gip was notified.  She was discharged home in stable condition with recommendations of home health PT/OT.  Madison Richards is being seen today for hospital follow-up.  She continues to do well from a stroke standpoint without residual deficits or recurring of symptoms.  She continues on aspirin and Plavix without side effects of bleeding or bruising.  Continues on pravastatin, Zetia and WelChol without reported side effects for HDL management.  Pressure today 140/66 which patient states is elevated as she does monitor at home and typical SBP 120-130.  She has returned back to all prior activities without complications.  She has continued to avoid tobacco use.  Denies new or worsening stroke/TIA symptoms.    ROS:   14 system review of systems performed and negative with exception of fatigue, snoring, anemia and weakness  PMH:  Past Medical History:  Diagnosis Date  . Generalized headaches    due to neck pain  . GERD (gastroesophageal reflux disease)   . Hyperlipidemia   . Hypertension   . Neck pain   . Osteoporosis   . Stroke [facility])     PSH:  Past Surgical History:  Procedure Laterality Date  . ABDOMINAL ANGIOGRAM Bilateral 03/02/2012   Procedure: ABDOMINAL ANGIOGRAM;  Surgeon: Laverda Page, MD;  Location: [facility] CATH LAB;  Service: Cardiovascular;  Laterality: Bilateral;  . ABDOMINAL ANGIOGRAM  04/06/2012  Procedure: ABDOMINAL ANGIOGRAM;  Surgeon: Laverda Page, MD;  Location: [facility] CATH LAB;  Service: Cardiovascular;;  . abdominal tumor     removed in 1968/benign  . APPENDECTOMY    . ILIAC ARTERY STENT     right side 2003 and 2014    Social History:  Social History   Socioeconomic History  . Marital status: Widowed    Spouse name: Not on file  . Number of children: Not on file  . Years of education: Not on file  . Highest education level: Not on file    Occupational History  . Not on file  Social Needs  . Financial resource strain: Not on file  . Food insecurity:    Worry: Not on file    Inability: Not on file  . Transportation needs:    Medical: Not on file    Non-medical: Not on file  Tobacco Use  . Smoking status: Former Smoker    Types: Cigarettes    Last attempt to quit: 02/07/2018    Years since quitting: 0.1  . Smokeless tobacco: Never Used  Substance and Sexual Activity  . Alcohol use: Yes    Alcohol/week: 1.0 standard drinks    Types: 1 Glasses of wine per week    Comment: daily  . Drug use: No  . Sexual activity: Not on file  Lifestyle  . Physical activity:    Days per week: Not on file    Minutes per session: Not on file  . Stress: Not on file  Relationships  . Social connections:    Talks on phone: Not on file    Gets together: Not on file    Attends religious service: Not on file    Active member of club or organization: Not on file    Attends meetings of clubs or organizations: Not on file    Relationship status: Not on file  . Intimate partner violence:    Fear of current or ex partner: Not on file    Emotionally abused: Not on file    Physically abused: Not on file    Forced sexual activity: Not on file  Other Topics Concern  . Not on file  Social History Narrative  . Not on file    Family History:  Family History  Problem Relation Age of Onset  . Heart disease Sister   . Heart disease Brother   . Heart disease Brother   . Colon cancer Neg Hx     Medications:   Current Outpatient Medications on File Prior to Visit  Medication Sig Dispense Refill  . Amlodipine-Valsartan-HCTZ (EXFORGE HCT) 5-160-12.5 MG TABS Take 1 tablet by mouth daily.    Marland Kitchen aspirin EC 81 MG tablet Take 81 mg by mouth daily.    . Calcium Carbonate-Vitamin D (CALCIUM 600 + D PO) Take 1 tablet by mouth 2 (two) times daily.    . celecoxib (CELEBREX) 200 MG capsule Take 200 mg by mouth daily as needed for mild pain.     .  Cholecalciferol (VITAMIN D3) 5000 UNITS TABS Take 1 tablet by mouth every Monday, Wednesday, and Friday.    . clopidogrel (PLAVIX) 75 MG tablet Take 1 tablet (75 mg total) by mouth daily. 30 tablet 3  . Coenzyme Q10 (COQ10 PO) Take 200 mg by mouth.    . colesevelam (WELCHOL) 625 MG tablet Take 625 mg by mouth 2 (two) times daily with a meal.     . ezetimibe (ZETIA) 10 MG tablet Take  10 mg by mouth daily.    . IRON PO Take 65 mg by mouth.    . loratadine (CLARITIN) 10 MG tablet Take 10 mg by mouth daily as needed for allergies.     . metoprolol tartrate (LOPRESSOR) 25 MG tablet Take 25 mg by mouth 2 (two) times daily.    . Multiple Vitamin (MULTIVITAMIN WITH MINERALS) TABS Take 1 tablet by mouth daily.    . pravastatin (PRAVACHOL) 40 MG tablet Take 1 tablet (40 mg total) by mouth at bedtime. 30 tablet 3  . risedronate (ACTONEL) 35 MG tablet Take 35 mg by mouth every 7 (seven) days. with water on empty stomach, nothing by mouth or lie down for next 30 minutes.     No current facility-administered medications on file prior to visit.     Allergies:   Allergies  Allergen Reactions  . Fosamax [Alendronate Sodium]     Leg cramps  . Clindamycin/Lincomycin Rash    Rash in mouth  . Neosporin [Neomycin-Polymyxin-Gramicidin] Rash  . Penicillins Rash    Rash in mouth     Physical Exam  Vitals:   03/19/18 0908  BP: 140/66  Pulse: (!) 57  Weight: 115 lb 3.2 oz (52.3 kg)  Height: 5\' 4"  (1.626 m)   Body mass index is 19.77 kg/m. No exam data present  General: Frail pleasant elderly Caucasian female, seated, in no evident distress Head: head normocephalic and atraumatic.   Neck: supple with no carotid or supraclavicular bruits Cardiovascular: regular rate and rhythm, no murmurs Musculoskeletal: no deformity Skin:  no rash/petichiae Vascular:  Normal pulses all extremities  Neurologic Exam Mental Status: Awake and fully alert. Oriented to place and time. Recent and remote memory  intact. Attention span, concentration and fund of knowledge appropriate. Mood and affect appropriate.  Cranial Nerves: Fundoscopic exam reveals sharp disc margins. Pupils equal, briskly reactive to light. Extraocular movements full without nystagmus. Visual fields full to confrontation. Hearing intact. Facial sensation intact. Face, tongue, palate moves normally and symmetrically.  Motor: Normal bulk and tone. Normal strength in all tested extremity muscles. Sensory.: intact to touch , pinprick , position and vibratory sensation.  Coordination: Rapid alternating movements normal in all extremities. Finger-to-nose and heel-to-shin performed accurately bilaterally. Gait and Station: Arises from chair without difficulty. Stance is normal. Gait demonstrates normal stride length and balance. Able to heel, toe and tandem walk without difficulty.  Reflexes: 1+ and symmetric. Toes downgoing.    NIHSS  0 Modified Rankin  0    Diagnostic Data (Labs, Imaging, Testing)  CT HEAD WO CONTRAST 02/08/2018 IMPRESSION: 1. No acute intracranial findings. 2. Periventricular white matter and corona radiata hypodensities favor chronic ischemic microvascular white matter disease. 3. Minimal chronic left ethmoid sinusitis.  MR BRAIN WO CONTRAST 02/08/2018 IMPRESSION: 1. 11 mm acute ischemic sooner type infarct involving the posterior left internal capsule and caudate. No associated hemorrhage. 2. Underlying age-related cerebral atrophy with mild chronic small vessel ischemic disease.  MR MRA HEAD  02/09/2018 IMPRESSION: Normal examination.  ECHOCARDIOGRAM 02/09/2018 Study Conclusions  - Left ventricle: The cavity size was normal. There was mild   concentric hypertrophy. Systolic function was normal. The   estimated ejection fraction was in the range of 60% to 65%. Wall   motion was normal; there were no regional wall motion   abnormalities. Features are consistent with a pseudonormal left    ventricular filling pattern, with concomitant abnormal relaxation   and increased filling pressure (grade 2 diastolic dysfunction).   Doppler  parameters are consistent with high ventricular filling   pressure. - Aortic valve: Transvalvular velocity was within the normal range.   There was no stenosis. There was no regurgitation. - Mitral valve: Transvalvular velocity was within the normal range.   There was no evidence for stenosis. There was mild regurgitation. - Left atrium: The atrium was mildly dilated. - Right ventricle: The cavity size was normal. Wall thickness was   normal. Systolic function was normal. - Atrial septum: No defect or patent foramen ovale was identified   by color flow Doppler. - Tricuspid valve: There was no regurgitation. - Pulmonary arteries: Systolic pressure was within the normal   range. PA peak pressure: 35 mm Hg (S).  VAS US CAROTID DUPLEX BILATERAL 02/09/2018 Summary: Right Carotid: Velocities in the right ICA are consistent with a 1-39% stenosis.  Left Carotid: Velocities in the left ICA are consistent with a 1-39% stenosis.  Vertebrals:  Right vertebral artery demonstrates antegrade flow. "Bunny " sign              on the left. Subclavians: Left subclavian artery flow was disturbed. Normal flow hemodynamics              were seen in the right subclavian artery.    ASSESSMENT: Madison Richards is a 84 y.o. year old female here with left basal ganglia infarct on 02/08/2018 secondary to small vessel disease. Vascular risk factors include HTN, HLD, left ICA stenosis, tobacco use and PAD.  She is being seen today for hospital follow-up and overall has recovered well from a stroke standpoint without residual deficits or reoccurring of symptoms.    PLAN:  1. Left basal ganglia infarct: Continue aspirin 81 mg daily and clopidogrel 75 mg daily  and WelChol and Zetia for secondary stroke prevention. Maintain strict control of hypertension with blood  pressure goal below 130/90, diabetes with hemoglobin A1c goal below 6.5% and cholesterol with LDL cholesterol (bad cholesterol) goal below 70 mg/dL.  I also advised the patient to eat a healthy diet with plenty of whole grains, cereals, fruits and vegetables, exercise regularly with at least 30 minutes of continuous activity daily and maintain ideal body weight. 2. Left ICA stenosis: Continue DAPT along with continued follow-up with Dr. Einar Gip for continued surveillance monitoring 3. HTN: Advised to continue current treatment regimen.  Today's BP 140/66.  Advised to continue to monitor at home along with continued follow-up with PCP for management 4. HLD: Advised to continue current treatment regimen along with continued follow-up with PCP for future prescribing and monitoring of lipid panel 5. Tobacco use: Continues to avoid and congratulated on this achievement.  Highly encouraged her to continue this as this will greatly reduce her chances of recurrent stroke    Follow up in 6 months or call earlier if needed   Greater than 50% of time during this 25 minute visit was spent on counseling, explanation of diagnosis of left basal ganglia infarct, reviewing risk factor management of HTN, HLD, prior tobacco use and left ICA stenosis, planning of further management along with potential future management, and discussion with patient and family answering all questions.    Venancio Poisson, AGNP-BC  [facility] [address] [place], [place] [address]  Phone [phone] Fax [phone] Note: This document was prepared with digital dictation and possible smart phrase technology. Any transcriptional errors that result from this process are unintentional.

## 2018-03-19 NOTE — Patient Instructions (Addendum)
Continue aspirin 81 mg daily and clopidogrel 75 mg daily  and WelChol and Zetia for secondary stroke prevention  Continue to follow up with PCP regarding cholesterol and blood pressure management   Great job with continuing not to smoke. This is not an easy thing to do and should be proud of yourself! Keep up the good work!  Continue to follow with Dr. Nadyne Coombes for left carotid stenosis  Continue to stay active and maintain a healthy diet  Continue to monitor blood pressure at home  Maintain strict control of hypertension with blood pressure goal below 130/90, diabetes with hemoglobin A1c goal below 6.5% and cholesterol with LDL cholesterol (bad cholesterol) goal below 70 mg/dL. I also advised the patient to eat a healthy diet with plenty of whole grains, cereals, fruits and vegetables, exercise regularly and maintain ideal body weight.  Followup in the future with me in 6 months or call earlier if needed       Thank you for coming to see [place] at [facility]. I hope we have been able to provide you high quality care today.  You may receive a patient satisfaction survey over the next few weeks. We would appreciate your feedback and comments so that we may continue to improve ourselves and the health of our patients.

## 2018-03-22 NOTE — Progress Notes (Signed)
I agree with the above plan 

## 2018-04-15 ENCOUNTER — Ambulatory Visit
Admission: RE | Admit: 2018-04-15 | Discharge: 2018-04-15 | Disposition: A | Discharge status: Home / Self Care | Payer: Medicare Other | Source: Ambulatory Visit | Attending: Family Medicine | Admitting: Family Medicine

## 2018-04-15 DIAGNOSIS — Z1231 Encounter for screening mammogram for malignant neoplasm of breast: Secondary | ICD-10-CM

## 2018-04-30 ENCOUNTER — Other Ambulatory Visit: Payer: Self-pay | Admitting: Cardiology

## 2018-06-14 ENCOUNTER — Other Ambulatory Visit: Payer: Self-pay | Admitting: Cardiology

## 2018-06-14 MED ORDER — CLOPIDOGREL BISULFATE 75 MG PO TABS: 75.0000 mg | ORAL_TABLET | Freq: Every day | ORAL | 1 refills | Status: DC

## 2018-09-17 ENCOUNTER — Ambulatory Visit: Payer: Medicare Other | Admitting: Adult Health

## 2018-10-14 ENCOUNTER — Ambulatory Visit: Payer: Self-pay | Admitting: Cardiology

## 2018-10-19 ENCOUNTER — Ambulatory Visit: Payer: Medicare Other | Admitting: Adult Health

## 2018-11-04 ENCOUNTER — Ambulatory Visit: Payer: Medicare Other | Admitting: Adult Health

## 2018-11-04 ENCOUNTER — Encounter: Payer: Self-pay | Admitting: Adult Health

## 2018-11-04 ENCOUNTER — Other Ambulatory Visit: Payer: Self-pay

## 2018-11-04 VITALS — BP 119/54 | HR 73 | Temp 97.3°F | Ht 64.0 in | Wt 108.4 lb

## 2018-11-04 DIAGNOSIS — I10 Essential (primary) hypertension: Secondary | ICD-10-CM

## 2018-11-04 DIAGNOSIS — I6522 Occlusion and stenosis of left carotid artery: Secondary | ICD-10-CM

## 2018-11-04 DIAGNOSIS — Z8673 Personal history of transient ischemic attack (TIA), and cerebral infarction without residual deficits: Secondary | ICD-10-CM

## 2018-11-04 DIAGNOSIS — E785 Hyperlipidemia, unspecified: Secondary | ICD-10-CM

## 2018-11-04 NOTE — Progress Notes (Signed)
I agree with the above plan 

## 2018-11-04 NOTE — Patient Instructions (Signed)
Continue aspirin 81 mg daily and clopidogrel 75 mg daily  and pravastatin/Zetia/WelChol for secondary stroke prevention  Continue to follow up with PCP regarding cholesterol and blood pressure management   Continue to follow with Dr. Einar Gip with appointment at the end of this month  Continue to monitor blood pressure at home  Maintain strict control of hypertension with blood pressure goal below 130/90, diabetes with hemoglobin A1c goal below 6.5% and cholesterol with LDL cholesterol (bad cholesterol) goal below 70 mg/dL. I also advised the patient to eat a healthy diet with plenty of whole grains, cereals, fruits and vegetables, exercise regularly and maintain ideal body weight.  Follow up as needed as you have recovered well from a stroke standpoint       Thank you for coming to see [place] at [facility]. I hope we have been able to provide you high quality care today.  You may receive a patient satisfaction survey over the next few weeks. We would appreciate your feedback and comments so that we may continue to improve ourselves and the health of our patients.

## 2018-11-04 NOTE — Progress Notes (Signed)
[facility] [address] [place]. [place] [address] [phone]       OFFICE FOLLOW UP NOTE  [name] Date of Birth:  May 10, 1934 Medical Record Number:  [id number]   Reason for visit: Left BG infarct 01/2018  CHIEF COMPLAINT:  Chief Complaint  Patient presents with  . Follow-up    6 mon f/u. Alone. Rm 9. No new concerns at this time.     HPI: Stroke admission 02/08/2018: [name] is a 84 y.o. female with history of HTN, HLD, GERD, L ICA stenosis, tobacco use, GERD who was admitted with RLE weakness as well as change in voice.    CT head reviewed was negative for acute infarct but did show small vessel disease and chronic left ethmoid sinusitis.  MRI brain reviewed and showed 11 mm posterior left ICA quadrant infarct without evidence of hemorrhage.  MRA normal.  Carotid ultrasound showed bilateral ICA 1 to 39% stenosis with right vertebral artery demonstrating antegrade flow and left vertebral artery showing "funny" sign and left subclavian artery flow was disturbed with normal flow hemodynamics seen in the right subclavian artery.  2D echo showed an EF of 60 to 65% without evidence of PFO.  LDL 80 and A1c 5.8.  Patient previously on aspirin 81 mg and Plavix 75 mg PTA and recommended continuation at discharge due to history of left carotid stenosis with continued follow-up with Dr. Einar Gip.  HTN stable during admission recommended long-term BP goal normotensive range.  Recommended continuation of WelChol and Zetia for HDL management.  Other stroke risk factors include advanced age, tobacco use and PAD.  She was evaluated by Dr. Jaynee Eagles during admission and it was felt as though infarct not related to failure of DAPT but likely due to continued tobacco use and other vascular risk factors.  It was recommended to undergo 30-day cardiac event monitor and Dr. Einar Gip was notified.  She was discharged home in stable condition with recommendations of home health PT/OT.   Initial visit 03/19/2018: Madison Richards is being seen today for hospital follow-up.  She continues to do well from a stroke standpoint without residual deficits or recurring of symptoms.  She continues on aspirin and Plavix without side effects of bleeding or bruising.  Continues on pravastatin, Zetia and WelChol without reported side effects for HDL management.  Pressure today 140/66 which patient states is elevated as she does monitor at home and typical SBP 120-130.  She has returned back to all prior activities without complications.  She has continued to avoid tobacco use.  Denies new or worsening stroke/TIA symptoms.  Update 11/04/2018: Madison Richards is being seen today for 12-month stroke follow-up.  She has been stable from a stroke standpoint with occasional decreased right hand dexterity which typically only affects the smoothness of her cursive writing but otherwise did not affect any other aspects of functioning.  Continues on aspirin and Plavix without bleeding or bruising.  Continues on pravastatin, Zetia and WelChol for HDL management without side effects.  Blood pressures today satisfactory at 119/54. She does have follow-up visit with Dr. Einar Gip scheduled for the end of this month.  She continues to stay active and maintains a healthy diet.  Denies new or worsening stroke/TIA symptoms.    ROS:   Review of Systems  Constitutional: Negative.   HENT: Negative.   Eyes: Negative.   Respiratory: Negative.   Cardiovascular: Negative.   Gastrointestinal: Negative.   Genitourinary: Negative.   Musculoskeletal: Negative.  Skin: Negative.   Neurological: Positive for focal weakness (occassional right hand dexterity difficulty ).  Endo/Heme/Allergies: Negative.   Psychiatric/Behavioral: Negative.      PMH:  Past Medical History:  Diagnosis Date  . Generalized headaches    due to neck pain  . GERD (gastroesophageal reflux disease)   . Hyperlipidemia   . Hypertension   . Neck pain   .  Osteoporosis   . Stroke [facility])     PSH:  Past Surgical History:  Procedure Laterality Date  . ABDOMINAL ANGIOGRAM Bilateral 03/02/2012   Procedure: ABDOMINAL ANGIOGRAM;  Surgeon: Laverda Page, MD;  Location: [facility] CATH LAB;  Service: Cardiovascular;  Laterality: Bilateral;  . ABDOMINAL ANGIOGRAM  04/06/2012   Procedure: ABDOMINAL ANGIOGRAM;  Surgeon: Laverda Page, MD;  Location: [facility] CATH LAB;  Service: Cardiovascular;;  . abdominal tumor     removed in 1968/benign  . APPENDECTOMY    . ILIAC ARTERY STENT     right side 2003 and 2014    Social History:  Social History   Socioeconomic History  . Marital status: Widowed    Spouse name: Not on file  . Number of children: Not on file  . Years of education: Not on file  . Highest education level: Not on file  Occupational History  . Not on file  Social Needs  . Financial resource strain: Not on file  . Food insecurity    Worry: Not on file    Inability: Not on file  . Transportation needs    Medical: Not on file    Non-medical: Not on file  Tobacco Use  . Smoking status: Former Smoker    Types: Cigarettes    Quit date: 02/07/2018    Years since quitting: 0.7  . Smokeless tobacco: Never Used  Substance and Sexual Activity  . Alcohol use: Yes    Alcohol/week: 1.0 standard drinks    Types: 1 Glasses of wine per week    Comment: daily  . Drug use: No  . Sexual activity: Not on file  Lifestyle  . Physical activity    Days per week: Not on file    Minutes per session: Not on file  . Stress: Not on file  Relationships  . Social Herbalist on phone: Not on file    Gets together: Not on file    Attends religious service: Not on file    Active member of club or organization: Not on file    Attends meetings of clubs or organizations: Not on file    Relationship status: Not on file  . Intimate partner violence    Fear of current or ex partner: Not on file    Emotionally abused: Not on file    Physically  abused: Not on file    Forced sexual activity: Not on file  Other Topics Concern  . Not on file  Social History Narrative  . Not on file    Family History:  Family History  Problem Relation Age of Onset  . Heart disease Sister   . Heart disease Brother   . Heart disease Brother   . Colon cancer Neg Hx   . Breast cancer Neg Hx     Medications:   Current Outpatient Medications on File Prior to Visit  Medication Sig Dispense Refill  . amLODIPine-Valsartan-HCTZ 5-160-12.5 MG TABS TAKE 1 TABLET BY MOUTH DAILY N THE MORNING 90 tablet 4  . aspirin EC 81 MG tablet Take 81 mg  by mouth daily.    . Calcium Carbonate-Vitamin D (CALCIUM 600 + D PO) Take 1 tablet by mouth 2 (two) times daily.    . celecoxib (CELEBREX) 200 MG capsule Take 200 mg by mouth daily as needed for mild pain.     . Cholecalciferol (VITAMIN D3) 5000 UNITS TABS Take 1 tablet by mouth every Monday, Wednesday, and Friday.    . clopidogrel (PLAVIX) 75 MG tablet Take 1 tablet (75 mg total) by mouth daily. 90 tablet 1  . Coenzyme Q10 (COQ10 PO) Take 200 mg by mouth.    . colesevelam (WELCHOL) 625 MG tablet Take 625 mg by mouth 2 (two) times daily with a meal.     . ezetimibe (ZETIA) 10 MG tablet Take 10 mg by mouth daily.    . IRON PO Take 65 mg by mouth.    . loratadine (CLARITIN) 10 MG tablet Take 10 mg by mouth daily as needed for allergies.     . metoprolol tartrate (LOPRESSOR) 25 MG tablet Take 25 mg by mouth 2 (two) times daily.    . Multiple Vitamin (MULTIVITAMIN WITH MINERALS) TABS Take 1 tablet by mouth daily.    . pravastatin (PRAVACHOL) 40 MG tablet Take 1 tablet (40 mg total) by mouth at bedtime. 30 tablet 3  . risedronate (ACTONEL) 35 MG tablet Take 35 mg by mouth every 7 (seven) days. with water on empty stomach, nothing by mouth or lie down for next 30 minutes.     No current facility-administered medications on file prior to visit.     Allergies:   Allergies  Allergen Reactions  . Fosamax [Alendronate  Sodium]     Leg cramps  . Clindamycin/Lincomycin Rash    Rash in mouth  . Neosporin [Neomycin-Polymyxin-Gramicidin] Rash  . Penicillins Rash    Rash in mouth     Physical Exam  Vitals:   11/04/18 0722  BP: (!) 119/54  Pulse: 73  Temp: (!) 97.3 F (36.3 C)  TempSrc: Oral  Weight: 108 lb 6.4 oz (49.2 kg)  Height: 5\' 4"  (1.626 m)   Body mass index is 18.61 kg/m. No exam data present  General: Frail pleasant elderly Caucasian female, seated, in no evident distress Head: head normocephalic and atraumatic.   Neck: supple with no carotid or supraclavicular bruits Cardiovascular: regular rate and rhythm, no murmurs Musculoskeletal: no deformity Skin:  no rash/petichiae Vascular:  Normal pulses all extremities  Neurologic Exam Mental Status: Awake and fully alert. Oriented to place and time. Recent and remote memory intact. Attention span, concentration and fund of knowledge appropriate. Mood and affect appropriate.  Cranial Nerves: Fundoscopic exam reveals sharp disc margins. Pupils equal, briskly reactive to light. Extraocular movements full without nystagmus. Visual fields full to confrontation. Hearing intact. Facial sensation intact. Face, tongue, palate moves normally and symmetrically.  Motor: Normal bulk and tone. Normal strength in all tested extremity muscles. Sensory.: intact to touch , pinprick , position and vibratory sensation.  Coordination: Rapid alternating movements normal in all extremities. Finger-to-nose and heel-to-shin performed accurately bilaterally.  No evidence of dexterity difficulties during visit. Gait and Station: Arises from chair without difficulty. Stance is normal. Gait demonstrates normal stride length and balance. Able to heel, toe and tandem walk without difficulty.  Reflexes: 1+ and symmetric. Toes downgoing.      Diagnostic Data (Labs, Imaging, Testing)  CT HEAD WO CONTRAST 02/08/2018 IMPRESSION: 1. No acute intracranial findings. 2.  Periventricular white matter and corona radiata hypodensities favor chronic ischemic microvascular  white matter disease. 3. Minimal chronic left ethmoid sinusitis.  MR BRAIN WO CONTRAST 02/08/2018 IMPRESSION: 1. 11 mm acute ischemic sooner type infarct involving the posterior left internal capsule and caudate. No associated hemorrhage. 2. Underlying age-related cerebral atrophy with mild chronic small vessel ischemic disease.  MR MRA HEAD  02/09/2018 IMPRESSION: Normal examination.  ECHOCARDIOGRAM 02/09/2018 Study Conclusions  - Left ventricle: The cavity size was normal. There was mild   concentric hypertrophy. Systolic function was normal. The   estimated ejection fraction was in the range of 60% to 65%. Wall   motion was normal; there were no regional wall motion   abnormalities. Features are consistent with a pseudonormal left   ventricular filling pattern, with concomitant abnormal relaxation   and increased filling pressure (grade 2 diastolic dysfunction).   Doppler parameters are consistent with high ventricular filling   pressure. - Aortic valve: Transvalvular velocity was within the normal range.   There was no stenosis. There was no regurgitation. - Mitral valve: Transvalvular velocity was within the normal range.   There was no evidence for stenosis. There was mild regurgitation. - Left atrium: The atrium was mildly dilated. - Right ventricle: The cavity size was normal. Wall thickness was   normal. Systolic function was normal. - Atrial septum: No defect or patent foramen ovale was identified   by color flow Doppler. - Tricuspid valve: There was no regurgitation. - Pulmonary arteries: Systolic pressure was within the normal   range. PA peak pressure: 35 mm Hg (S).  VAS US CAROTID DUPLEX BILATERAL 02/09/2018 Summary: Right Carotid: Velocities in the right ICA are consistent with a 1-39% stenosis.  Left Carotid: Velocities in the left ICA are consistent  with a 1-39% stenosis.  Vertebrals:  Right vertebral artery demonstrates antegrade flow. "Bunny " sign              on the left. Subclavians: Left subclavian artery flow was disturbed. Normal flow hemodynamics              were seen in the right subclavian artery.    ASSESSMENT: ETHIE YANNI is a 84 y.o. year old female here with left basal ganglia infarct on 02/08/2018 secondary to small vessel disease. Vascular risk factors include HTN, HLD, left ICA stenosis, tobacco use and PAD.  Residual deficits of occasional right hand decreased dexterity but overall recovered well without any deficits noted at today's visit    PLAN:  1. Left basal ganglia infarct: Continue aspirin 81 mg daily and clopidogrel 75 mg daily  and WelChol and Zetia for secondary stroke prevention. Maintain strict control of hypertension with blood pressure goal below 130/90, diabetes with hemoglobin A1c goal below 6.5% and cholesterol with LDL cholesterol (bad cholesterol) goal below 70 mg/dL.  I also advised the patient to eat a healthy diet with plenty of whole grains, cereals, fruits and vegetables, exercise regularly with at least 30 minutes of continuous activity daily and maintain ideal body weight. 2. Left ICA stenosis: Continue DAPT along with continued follow-up with Dr. Einar Gip for continued surveillance monitoring -appointment scheduled at the end of this month 3. HTN: Advised to continue current treatment regimen. Advised to continue to monitor at home along with continued follow-up with PCP for management 4. HLD: Advised to continue current treatment regimen along with continued follow-up with PCP for future prescribing and monitoring of lipid panel  Recovered well from a stroke standpoint recommend follow-up as needed   Greater than 50% of time during this 25 minute  visit was spent on counseling, explanation of diagnosis of left basal ganglia infarct, reviewing risk factor management of HTN, HLD, prior tobacco  use and left ICA stenosis, planning of further management along with potential future management, and discussion with patient and family answering all questions.    Frann Rider, AGNP-BC  [facility] [address] [place], [place] [address]  Phone [phone] Fax [phone] Note: This document was prepared with digital dictation and possible smart phrase technology. Any transcriptional errors that result from this process are unintentional.

## 2018-11-08 ENCOUNTER — Other Ambulatory Visit: Payer: Self-pay

## 2018-11-08 MED ORDER — METOPROLOL TARTRATE 25 MG PO TABS: 25.0000 mg | ORAL_TABLET | Freq: Two times a day (BID) | ORAL | 0 refills | Status: DC

## 2018-11-16 ENCOUNTER — Ambulatory Visit: Payer: Self-pay | Admitting: Cardiology

## 2018-12-06 ENCOUNTER — Encounter: Payer: Self-pay | Admitting: Cardiology

## 2018-12-06 ENCOUNTER — Other Ambulatory Visit: Payer: Self-pay

## 2018-12-06 ENCOUNTER — Ambulatory Visit: Payer: Medicare Other | Admitting: Cardiology

## 2018-12-06 VITALS — BP 140/80 | HR 71 | Ht 63.0 in | Wt 108.1 lb

## 2018-12-06 DIAGNOSIS — Z8673 Personal history of transient ischemic attack (TIA), and cerebral infarction without residual deficits: Secondary | ICD-10-CM | POA: Diagnosis not present

## 2018-12-06 DIAGNOSIS — I10 Essential (primary) hypertension: Secondary | ICD-10-CM | POA: Diagnosis not present

## 2018-12-06 DIAGNOSIS — I739 Peripheral vascular disease, unspecified: Secondary | ICD-10-CM | POA: Diagnosis not present

## 2018-12-06 NOTE — Progress Notes (Signed)
Primary Physician/Referring:  Kathyrn Lass, MD  Patient ID: Madison Richards, female    DOB: [birth year], 84 y.o.   MRN: [id number]  Chief Complaint  Patient presents with  . PAD  . Follow-up    1 yr   HPI:    Madison Richards  is a 84 y.o.  Caucasian female with peripheral arterial disease, hypertension, hyperlipidemia, small abdominal aortic aneurysm of 3 cm, prior tobacco use disorder, she has had right common iliac artery aneurysm stenting with Viabahn covered stents on 04/06/2012 and on 05/26/2014 had thrombotic event after she had stopped her antiplatelet therapy and underwent thrombectomy. She has done well since then without claudication. She has remained "abstinant" with tobacco since Feb 2019.  She had recurrent stroke in Dec 2019, fortunately right arm weakness has resolved and no dysarthria. She is without new complaints today.   Past Medical History:  Diagnosis Date  . Generalized headaches    due to neck pain  . GERD (gastroesophageal reflux disease)   . Hyperlipidemia   . Hypertension   . Neck pain   . Osteoporosis   . Stroke [facility])    Past Surgical History:  Procedure Laterality Date  . ABDOMINAL ANGIOGRAM Bilateral 03/02/2012   Procedure: ABDOMINAL ANGIOGRAM;  Surgeon: Laverda Page, MD;  Location: [facility] CATH LAB;  Service: Cardiovascular;  Laterality: Bilateral;  . ABDOMINAL ANGIOGRAM  04/06/2012   Procedure: ABDOMINAL ANGIOGRAM;  Surgeon: Laverda Page, MD;  Location: [facility] CATH LAB;  Service: Cardiovascular;;  . abdominal tumor     removed in 1968/benign  . APPENDECTOMY    . ILIAC ARTERY STENT     right side 2003 and 2014   Social History   Socioeconomic History  . Marital status: Widowed    Spouse name: Not on file  . Number of children: 2  . Years of education: Not on file  . Highest education level: Not on file  Occupational History  . Not on file  Social Needs  . Financial resource strain: Not on file  . Food insecurity    Worry: Not on file   Inability: Not on file  . Transportation needs    Medical: Not on file    Non-medical: Not on file  Tobacco Use  . Smoking status: Former Smoker    Types: Cigarettes    Quit date: 02/07/2018    Years since quitting: 0.8  . Smokeless tobacco: Never Used  Substance and Sexual Activity  . Alcohol use: Yes    Alcohol/week: 1.0 standard drinks    Types: 1 Glasses of wine per week    Comment: daily  . Drug use: No  . Sexual activity: Not on file  Lifestyle  . Physical activity    Days per week: Not on file    Minutes per session: Not on file  . Stress: Not on file  Relationships  . Social Herbalist on phone: Not on file    Gets together: Not on file    Attends religious service: Not on file    Active member of club or organization: Not on file    Attends meetings of clubs or organizations: Not on file    Relationship status: Not on file  . Intimate partner violence    Fear of current or ex partner: Not on file    Emotionally abused: Not on file    Physically abused: Not on file    Forced sexual activity: Not on file  Other  Topics Concern  . Not on file  Social History Narrative  . Not on file   ROS  Review of Systems  Constitution: Negative for chills, decreased appetite, malaise/fatigue and weight gain.  Cardiovascular: Negative for chest pain, claudication, dyspnea on exertion, leg swelling and syncope.  Respiratory: Positive for cough and shortness of breath.   Endocrine: Negative for cold intolerance.  Hematologic/Lymphatic: Does not bruise/bleed easily.  Musculoskeletal: Positive for joint pain (left knee worse, diffuse). Negative for joint swelling.  Gastrointestinal: Negative for abdominal pain, anorexia, change in bowel habit, hematochezia and melena.  Neurological: Negative for headaches and light-headedness.  Psychiatric/Behavioral: Negative for depression and substance abuse.  All other systems reviewed and are negative.  Objective   Vitals with  BMI 12/06/2018 11/04/2018 03/19/2018  Height 5\' 3"  5\' 4"  5\' 4"   Weight 108 lbs 2 oz 108 lbs 6 oz 115 lbs 3 oz  BMI 19.15 99991111 [id number]  Systolic [id number]  Diastolic 80 54 66  Pulse 71 73 57     Physical Exam  Constitutional:  She is moderately built and petite, in no acute distress.  HENT:  Head: Atraumatic.  Eyes: Conjunctivae are normal.  Neck: Neck supple. No JVD present. No thyromegaly present.  Cardiovascular: Normal rate, regular rhythm and normal heart sounds. Exam reveals no gallop.  No murmur heard. Pulses:      Carotid pulses are 2+ on the right side with bruit and 2+ on the left side with bruit.      Femoral pulses are 2+ on the right side and 2+ on the left side.      Popliteal pulses are 1+ on the right side and 1+ on the left side.       Dorsalis pedis pulses are 0 on the right side and 0 on the left side.       Posterior tibial pulses are 1+ on the right side and 1+ on the left side.  No leg edema, no JVD. Prominent abdominal aortic pulsation present without tenderness (thin individual).   Pulmonary/Chest: Effort normal and breath sounds normal.  Abdominal: Soft. Bowel sounds are normal.  Musculoskeletal: Normal range of motion.  Neurological: She is alert.  Skin: Skin is warm and dry.  Psychiatric: She has a normal mood and affect.   Radiology: No results found.  Laboratory examination:   Recent Labs    02/08/18 1815  NA 137  K 3.6  CL 107  CO2 21*  GLUCOSE 114*  BUN 18  CREATININE 0.56  CALCIUM 9.9  GFRNONAA >60  GFRAA >60   CMP Latest Ref Rng & Units 02/08/2018 05/27/2014 05/26/2014  Glucose 70 - 99 mg/dL 114(H) 127(H) 190(H)  BUN 8 - 23 mg/dL 18 23 32(H)  Creatinine 0.44 - 1.00 mg/dL 0.56 0.68 0.80  Sodium 135 - 145 mmol/L 137 139 139  Potassium 3.5 - 5.1 mmol/L 3.6 3.8 3.3(L)  Chloride 98 - 111 mmol/L 107 109 104  CO2 22 - 32 mmol/L 21(L) 27 24  Calcium 8.9 - 10.3 mg/dL 9.9 8.7 10.3  Total Protein 6.5 - 8.1 g/dL 7.1 6.2 7.4  Total Bilirubin  0.3 - 1.2 mg/dL 0.5 0.7 0.2(L)  Alkaline Phos 38 - 126 U/L 57 67 78  AST 15 - 41 U/L 24 25 26   ALT 0 - 44 U/L 12 15 18    CBC Latest Ref Rng & Units 02/08/2018 05/30/2014 05/29/2014  WBC 4.0 - 10.5 K/uL 9.5 11.5(H) 11.4(H)  Hemoglobin 12.0 - 15.0 g/dL 9.5(L)  11.8(L) 13.3  Hematocrit 36.0 - 46.0 % 31.8(L) 34.5(L) 39.1  Platelets 150 - 400 K/uL 422(H) 187 180   Lipid Panel     Component Value Date/Time   CHOL 172 02/09/2018 0950   TRIG 74 02/09/2018 0950   HDL 77 02/09/2018 0950   CHOLHDL 2.2 02/09/2018 0950   VLDL 15 02/09/2018 0950   LDLCALC 80 02/09/2018 0950   HEMOGLOBIN A1C Lab Results  Component Value Date   HGBA1C 5.8 (H) 02/09/2018   MPG 120 02/09/2018   TSH No results for input(s): TSH in the last 8760 hours. Medications and allergies   Allergies  Allergen Reactions  . Fosamax [Alendronate Sodium]     Leg cramps  . Clindamycin/Lincomycin Rash    Rash in mouth  . Neosporin [Neomycin-Polymyxin-Gramicidin] Rash  . Penicillins Rash    Rash in mouth     Prior to Admission medications   Medication Sig Start Date End Date Taking? Authorizing Provider  amLODIPine-Valsartan-HCTZ 5-160-12.5 MG TABS TAKE 1 TABLET BY MOUTH DAILY N THE MORNING 05/03/18   Adrian Prows, MD  aspirin EC 81 MG tablet Take 81 mg by mouth daily.    [provider]  Calcium Carbonate-Vitamin D (CALCIUM 600 + D PO) Take 1 tablet by mouth 2 (two) times daily.    [provider]  celecoxib (CELEBREX) 200 MG capsule Take 200 mg by mouth daily as needed for mild pain.     [provider]  Cholecalciferol (VITAMIN D3) 5000 UNITS TABS Take 1 tablet by mouth every Monday, Wednesday, and Friday.    [provider]  clopidogrel (PLAVIX) 75 MG tablet Take 1 tablet (75 mg total) by mouth daily. 06/14/18   Miquel Dunn, NP  Coenzyme Q10 (COQ10 PO) Take 200 mg by mouth.    [provider]  colesevelam (WELCHOL) 625 MG tablet Take 625 mg by mouth 2 (two) times  daily with a meal.     [provider]  ezetimibe (ZETIA) 10 MG tablet Take 10 mg by mouth daily.    [provider]  IRON PO Take 65 mg by mouth.    [provider]  loratadine (CLARITIN) 10 MG tablet Take 10 mg by mouth daily as needed for allergies.     [provider]  metoprolol tartrate (LOPRESSOR) 25 MG tablet Take 1 tablet (25 mg total) by mouth 2 (two) times daily. 11/08/18   Adrian Prows, MD  Multiple Vitamin (MULTIVITAMIN WITH MINERALS) TABS Take 1 tablet by mouth daily.    [provider]  pravastatin (PRAVACHOL) 40 MG tablet Take 1 tablet (40 mg total) by mouth at bedtime. 02/09/18   Rai, Vernelle Emerald, MD  risedronate (ACTONEL) 35 MG tablet Take 35 mg by mouth every 7 (seven) days. with water on empty stomach, nothing by mouth or lie down for next 30 minutes.    [provider]     Current Outpatient Medications  Medication Instructions  . amLODIPine-Valsartan-HCTZ 5-160-12.5 MG TABS TAKE 1 TABLET BY MOUTH DAILY N THE MORNING  . aspirin EC 81 mg, Daily  . Calcium Carbonate-Vitamin D (CALCIUM 600 + D PO) 1 tablet, 2 times daily  . celecoxib (CELEBREX) 200 mg, Oral, Daily PRN  . Cholecalciferol (VITAMIN D3) 5000 UNITS TABS 1 tablet, Every M-W-F  . clopidogrel (PLAVIX) 75 mg, Oral, Daily  . Coenzyme Q10 (COQ10 PO) 200 mg, Oral  . colestipol (COLESTID) 1 g, Oral, Daily, Take 2 pills daily   . ezetimibe (ZETIA) 10 mg,  Daily  . IRON PO 65 mg, Oral  . loratadine (CLARITIN) 10 mg, Oral, Daily PRN  . metoprolol tartrate (LOPRESSOR) 25 mg, Oral, 2 times daily  . Multiple Vitamin (MULTIVITAMIN WITH MINERALS) TABS 1 tablet, Daily  . pravastatin (PRAVACHOL) 40 mg, Oral, Daily at bedtime  . risedronate (ACTONEL) 35 mg, Oral, Every 7 days, with water on empty stomach, nothing by mouth or lie down for next 30 minutes.    Cardiac Studies:   Peripheral arteriogram:  04/06/2012: PTA and stenting of the right common and external iliac artery  large aneurysm with implantation of a 8.0 x 100 mm Viabahn covered stent. 05/26/2014 with acute right leg ischemia S/P balloon PTA of the right EIA stent and thrombolysis. 3 vessel r/o below knee.  Lower extremity arterial duplex 01/22/2016: No hemodynamically significant stenoses are identified in the lower extremity arterial system. There is diffuse heteregenous plaque. This exam reveals mildly decreased perfusion of the right lower extremity, RABI 0.83 and moderately decreased perfusion of the left lower extremity, LABI 0.75 noted at the post tibial artery level. Compared to 09/12/2014 study, ABI has decreased or laterally from RABI 0.89 and LABI 0.93. Right iliac stent appears patent by the study.  Carotid artery duplex 10/13/2017: Minimal stenosis in the right internal carotid artery (1-15%). Stenosis in the left internal carotid artery (16-49%). Antegrade right vertebral artery flow. Antegrade left vertebral artery flow. Compared to the study done on 10/06/2016, right ICA stenosis of 15-49% is not noted in the present study. Follow up in one year is appropriate if clinically indicated.  Echocardiogram 10/08/2016: 1. Left ventricle cavity is normal in size. Normal global wall motion. Elevated LAP. Grade II diastolic dysfunction Calculated EF 64%. 2. Mild biatrial enlargement. Lipomatous hypertrophy of the interatrial septum. 3. Right ventricle is mildly dilated with normal systolic function 4. Mild to moderate tricuspid regurgitation. Pulmonary artery systolic pressure is estimated at mm 30-35 Hg.  Assessment     ICD-10-CM   1. Claudication in peripheral vascular disease (HCC)  I73.9 EKG 12-Lead  2. H/O: CVA (cerebrovascular accident)  Z86.73 EKG 12-Lead   2013 and 02/08/2018    EKG 12/06/2018: Normal sinus rhythm at the rate of 69 bpm, left atrial enlargement, normal axis.  Nonspecific sagging ST changes.  Normal QT interval. No significant change from  10/14/2017. Marked Glade Stanford @ 48/min,  not present.  Recommendations:   She is here on annual visit and follow-up of peripheral arterial disease, fortunately she has done well and has not had any recurrence of strokelike symptoms.  From mass to standpoint, she has done well with no significant claudication, no significant change in physical exam compared to previous.  She continues to remain physically active.  There is no clinical evidence of heart failure, there is no clinical evidence of vascular insufficiency acute.  I have recommended that she keep an eye on the blood pressure, her systolic blood pressure was 140 mmHg today.  Like to keep the blood pressure closer to less than 130 mmHg, if her pressure is not at goal, the could certainly change Exforge HCT that she is presently on to a higher level either amlodipine or valsartan.  Otherwise stable from cardiac standpoint, I'll see her back in a year.  Adrian Prows, MD, [facility] 12/06/2018, 4:33 PM [facility] [place] Pager: [phone] Office: [phone] If no answer Cell [phone]

## 2018-12-11 ENCOUNTER — Other Ambulatory Visit: Payer: Self-pay | Admitting: Cardiology

## 2019-01-02 ENCOUNTER — Other Ambulatory Visit: Payer: Self-pay | Admitting: Cardiology

## 2019-02-26 ENCOUNTER — Other Ambulatory Visit: Payer: Self-pay | Admitting: Cardiology

## 2019-03-22 ENCOUNTER — Other Ambulatory Visit: Payer: Self-pay | Admitting: Family Medicine

## 2019-03-22 DIAGNOSIS — Z1231 Encounter for screening mammogram for malignant neoplasm of breast: Secondary | ICD-10-CM

## 2019-05-02 ENCOUNTER — Other Ambulatory Visit: Payer: Self-pay

## 2019-05-02 ENCOUNTER — Ambulatory Visit
Admission: RE | Admit: 2019-05-02 | Discharge: 2019-05-02 | Disposition: A | Discharge status: Home / Self Care | Payer: Medicare Other | Source: Ambulatory Visit | Attending: Family Medicine | Admitting: Family Medicine

## 2019-05-02 DIAGNOSIS — Z1231 Encounter for screening mammogram for malignant neoplasm of breast: Secondary | ICD-10-CM

## 2019-05-06 ENCOUNTER — Other Ambulatory Visit: Payer: Self-pay | Admitting: Cardiology

## 2019-06-06 ENCOUNTER — Other Ambulatory Visit: Payer: Self-pay | Admitting: Cardiology

## 2019-08-08 ENCOUNTER — Other Ambulatory Visit: Payer: Self-pay

## 2019-08-31 ENCOUNTER — Other Ambulatory Visit: Payer: Self-pay

## 2019-08-31 MED ORDER — AMLODIPINE BESYLATE-VALSARTAN 5-160 MG PO TABS: 1.0000 | ORAL_TABLET | Freq: Every day | ORAL | 3 refills | Status: DC

## 2019-08-31 MED ORDER — HYDROCHLOROTHIAZIDE 12.5 MG PO CAPS: 12.5000 mg | ORAL_CAPSULE | Freq: Every day | ORAL | 3 refills | Status: DC

## 2019-09-15 ENCOUNTER — Other Ambulatory Visit: Payer: Self-pay | Admitting: Cardiology

## 2019-11-12 ENCOUNTER — Other Ambulatory Visit: Payer: Self-pay | Admitting: Cardiology

## 2019-11-24 DIAGNOSIS — H348312 Tributary (branch) retinal vein occlusion, right eye, stable: Secondary | ICD-10-CM | POA: Diagnosis not present

## 2019-12-08 ENCOUNTER — Encounter: Payer: Self-pay | Admitting: Cardiology

## 2019-12-08 ENCOUNTER — Other Ambulatory Visit: Payer: Self-pay

## 2019-12-08 ENCOUNTER — Ambulatory Visit: Payer: Medicare Other | Admitting: Cardiology

## 2019-12-08 VITALS — BP 132/45 | HR 61 | Resp 16 | Ht 63.0 in | Wt 107.0 lb

## 2019-12-08 DIAGNOSIS — I739 Peripheral vascular disease, unspecified: Secondary | ICD-10-CM

## 2019-12-08 DIAGNOSIS — Z8673 Personal history of transient ischemic attack (TIA), and cerebral infarction without residual deficits: Secondary | ICD-10-CM | POA: Diagnosis not present

## 2019-12-08 DIAGNOSIS — I6522 Occlusion and stenosis of left carotid artery: Secondary | ICD-10-CM | POA: Diagnosis not present

## 2019-12-08 DIAGNOSIS — E7800 Pure hypercholesterolemia, unspecified: Secondary | ICD-10-CM | POA: Diagnosis not present

## 2019-12-08 NOTE — Progress Notes (Signed)
Primary Physician/Referring:  Kathyrn Lass, MD  Patient ID: Madison Richards, female    DOB: 20-Mar-1934, 85 y.o.   MRN: [id number]  Chief Complaint  Patient presents with  . PAD  . Follow-up    1 year   HPI:    Madison Richards  is a 85 y.o.  Caucasian female with peripheral arterial disease, hypertension, hyperlipidemia, small abdominal aortic aneurysm of 3 cm, prior tobacco use disorder, she has had right common iliac artery aneurysm stenting with Viabahn covered stents on 04/06/2012 and on 05/26/2014 had thrombotic event after she had stopped her antiplatelet therapy and underwent thrombectomy.She has remained "abstinant" with tobacco since Feb 2019.  She had recurrent stroke in Dec 2019, fortunately right arm weakness has resolved and no dysarthria.  She presents for annual visit, states that over the past few months she has markedly reduced her physical activity as her whole legs give away and also notices cramping especially at night in both legs.  She feels weak all the way from the hips down.  No bluish discoloration or ulceration in the feet.  Denies chest pain, denies any recurrent neurologic events, shortness of breath is remained stable, no PND or orthopnea.  Has chronic cough that is unchanged.  Past Medical History:  Diagnosis Date  . Generalized headaches    due to neck pain  . GERD (gastroesophageal reflux disease)   . Hyperlipidemia   . Hypertension   . Neck pain   . Osteoporosis   . Stroke [facility])    Past Surgical History:  Procedure Laterality Date  . ABDOMINAL ANGIOGRAM Bilateral 03/02/2012   Procedure: ABDOMINAL ANGIOGRAM;  Surgeon: Laverda Page, MD;  Location: [facility] CATH LAB;  Service: Cardiovascular;  Laterality: Bilateral;  . ABDOMINAL ANGIOGRAM  04/06/2012   Procedure: ABDOMINAL ANGIOGRAM;  Surgeon: Laverda Page, MD;  Location: [facility] CATH LAB;  Service: Cardiovascular;;  . abdominal tumor     removed in 1968/benign  . APPENDECTOMY    . AUGMENTATION  MAMMAPLASTY  2000   REMOVED in 2000  . ILIAC ARTERY STENT     right side 2003 and 2014   Social History   Tobacco Use  . Smoking status: Former Smoker    Types: Cigarettes    Quit date: 02/07/2018    Years since quitting: 1.8  . Smokeless tobacco: Never Used  Substance Use Topics  . Alcohol use: Yes    Alcohol/week: 1.0 standard drink    Types: 1 Glasses of wine per week    Comment: daily   Marital Status: Widowed   ROS  Review of Systems  Cardiovascular: Positive for claudication (bilateral legs) and dyspnea on exertion. Negative for chest pain and leg swelling.  Respiratory: Positive for cough (chronic).   Musculoskeletal: Positive for muscle cramps.  Gastrointestinal: Negative for melena.   Objective   Vitals with BMI 12/08/2019 12/06/2018 11/04/2018  Height 5\' 3"  5\' 3"  5\' 4"   Weight 107 lbs 108 lbs 2 oz 108 lbs 6 oz  BMI 18.96 09.32 67.1  Systolic 245 809 983  Diastolic 45 80 54  Pulse 61 71 73     Physical Exam Constitutional:      Comments: She is moderately built and petite, in no acute distress.  HENT:     Head: Atraumatic.  Eyes:     Conjunctiva/sclera: Conjunctivae normal.  Neck:     Thyroid: No thyromegaly.     Vascular: No JVD.  Cardiovascular:     Rate and Rhythm: Normal  rate and regular rhythm.     Pulses:          Carotid pulses are 2+ on the right side with bruit and 2+ on the left side with bruit.      Femoral pulses are 1+ on the right side with bruit and 1+ on the left side with bruit.      Popliteal pulses are 0 on the right side and 0 on the left side.       Dorsalis pedis pulses are 1+ on the right side and 1+ on the left side.       Posterior tibial pulses are 0 on the right side and 0 on the left side.     Heart sounds: Normal heart sounds. No murmur heard.  No gallop.      Comments: No leg edema, no JVD. Abdominal aortic pulsation present without tenderness (thin individual). No bruit.  Pulmonary:     Effort: Pulmonary effort is  normal.     Breath sounds: Normal breath sounds.  Abdominal:     General: Bowel sounds are normal.     Palpations: Abdomen is soft.  Musculoskeletal:        General: Normal range of motion.     Cervical back: Neck supple.  Skin:    General: Skin is warm and dry.  Neurological:     Mental Status: She is alert.    Radiology: No results found.  Laboratory examination:   External labs:  Cholesterol, total 173.000 m 10/03/2019 HDL 81.000 mg 10/03/2019 LDL-C 64.000 mg 10/03/2019 Triglycerides 172.000 m 10/03/2019  A1C 5.800 % 02/09/2018  Hemoglobin 13.300 g/d 11/05/2018  Creatinine, Serum 0.820 mg/ 10/03/2019 Potassium 4.200 mm 10/03/2019 ALT (SGPT) 9.000 U/L 10/03/2019  Medications and allergies   Allergies  Allergen Reactions  . Fosamax [Alendronate Sodium]     Leg cramps  . Clindamycin/Lincomycin Rash    Rash in mouth  . Neosporin [Neomycin-Polymyxin-Gramicidin] Rash  . Penicillins Rash    Rash in mouth    Current Outpatient Medications on File Prior to Visit  Medication Sig Dispense Refill  . amLODipine-valsartan (EXFORGE) 5-160 MG tablet Take 1 tablet by mouth daily. 90 tablet 3  . aspirin EC 81 MG tablet Take 81 mg by mouth daily.    . Calcium Carbonate-Vitamin D (CALCIUM 600 + D PO) Take 1 tablet by mouth 2 (two) times daily.    . celecoxib (CELEBREX) 200 MG capsule Take 200 mg by mouth daily as needed for mild pain.     . Cholecalciferol (VITAMIN D3) 5000 UNITS TABS Take 1 tablet by mouth every Monday, Wednesday, and Friday.    . clopidogrel (PLAVIX) 75 MG tablet TAKE 1 TABLET BY MOUTH EVERY DAY 90 tablet 1  . Coenzyme Q10 (COQ10 PO) Take 200 mg by mouth.    . colestipol (COLESTID) 1 g tablet Take 1 g by mouth daily. Take 2 pills daily    . ezetimibe (ZETIA) 10 MG tablet Take 10 mg by mouth daily.    . hydrochlorothiazide (MICROZIDE) 12.5 MG capsule Take 1 capsule (12.5 mg total) by mouth daily. 90 capsule 3  . loratadine (CLARITIN) 10 MG tablet Take 10 mg by mouth  daily as needed for allergies.     . metoprolol tartrate (LOPRESSOR) 25 MG tablet TAKE 1 TABLET BY MOUTH TWICE A DAY 180 tablet 1  . Multiple Vitamin (MULTIVITAMIN WITH MINERALS) TABS Take 1 tablet by mouth daily.    . pravastatin (PRAVACHOL) 40 MG tablet Take 1  tablet (40 mg total) by mouth at bedtime. 30 tablet 3  . risedronate (ACTONEL) 35 MG tablet Take 35 mg by mouth every 7 (seven) days. with water on empty stomach, nothing by mouth or lie down for next 30 minutes.     No current facility-administered medications on file prior to visit.    Cardiac Studies:   Peripheral arteriogram:  04/06/2012: PTA and stenting of the right common and external iliac artery large aneurysm with implantation of a 8.0 x 100 mm Viabahn covered stent. 05/26/2014 with acute right leg ischemia S/P balloon PTA of the right EIA stent and thrombolysis. 3 vessel r/o below knee.  Lower extremity arterial duplex 01/22/2016: No hemodynamically significant stenoses are identified in the lower extremity arterial system. There is diffuse heteregenous plaque. This exam reveals mildly decreased perfusion of the right lower extremity, RABI 0.83 and moderately decreased perfusion of the left lower extremity, LABI 0.75 noted at the post tibial artery level. Compared to 09/12/2014 study, ABI has decreased or laterally from RABI 0.89 and LABI 0.93. Right iliac stent appears patent by the study.  Carotid artery duplex 10/13/2017: Minimal stenosis in the right internal carotid artery (1-15%). Stenosis in the left internal carotid artery (16-49%). Antegrade right vertebral artery flow. Antegrade left vertebral artery flow. Compared to the study done on 10/06/2016, right ICA stenosis of 15-49% is not noted in the present study. Follow up in one year is appropriate if clinically indicated.  Echocardiogram 10/08/2016: 1. Left ventricle cavity is normal in size. Normal global wall motion. Elevated LAP. Grade II diastolic dysfunction  Calculated EF 64%. 2. Mild biatrial enlargement. Lipomatous hypertrophy of the interatrial septum. 3. Right ventricle is mildly dilated with normal systolic function 4. Mild to moderate tricuspid regurgitation. Pulmonary artery systolic pressure is estimated at mm 30-35 Hg.   EKG:    EKG 12/06/2018: Normal sinus rhythm at the rate of 69 bpm, left atrial enlargement, normal axis.  Nonspecific sagging ST changes.  Normal QT interval. No significant change from  10/14/2017. Marked Glade Stanford @ 48/min, not present.  Assessment     ICD-10-CM   1. PAD (peripheral artery disease) (HCC)  I73.9 PCV AORTA DUPLEX    PCV LOWER ARTERIAL (BILATERAL)  2. Claudication in peripheral vascular disease (HCC)  I73.9 EKG 12-Lead    PCV AORTA DUPLEX    PCV LOWER ARTERIAL (BILATERAL)  3. H/O: CVA (cerebrovascular accident)  Z86.73 PCV CAROTID DUPLEX (BILATERAL)  4. Left carotid artery stenosis  I65.22 PCV CAROTID DUPLEX (BILATERAL)  5. Hypercholesteremia  E78.00     No orders of the defined types were placed in this encounter.  Medications Discontinued During This Encounter  Medication Reason  . IRON PO Patient Preference     Recommendations:   TERRIN IMPARATO  is a 85 y.o.  Caucasian female with peripheral arterial disease, hypertension, hyperlipidemia, small abdominal aortic aneurysm of 3 cm, prior tobacco use disorder, she has had right common iliac artery aneurysm stenting with Viabahn covered stents on 04/06/2012 and on 05/26/2014 had thrombotic event after she had stopped her antiplatelet therapy and underwent thrombectomy.She has remained "abstinant" with tobacco since Feb 2019.  She had recurrent stroke in Dec 2019, fortunately right arm weakness has resolved and no dysarthria.  She is here on annual visit and follow-up of peripheral arterial disease, clearly her examination is changed to where bilateral femoral pulses are now reduced and absent popliteal pulse and pedal pulses although DP is 1+.   Capillary reflex is normal.  Symptoms are clearly suggestive of bilateral hip claudication with bilateral lower extremity weakness.  I will schedule her for lower extremity arterial duplex and also abdominal aortic duplex both for follow-up of AAA and also to exclude iliac artery stenosis.  She has bilateral carotid bruit, needs carotid artery surveillance as well.  This will also be ordered.  I discussed with the patient that if lower extremity duplex is abnormal, that we should proceed with peripheral arteriogram sooner than later as previously she had thrombotic complication.  She is willing to proceed.  Otherwise I will see her back in 6 weeks and hopefully showed I had peripheral arteriogram by that time if needed.  I clinically suspect she will need peripheral arteriogram.  Continue dual antiplatelet therapy for now. I reviewed her external labs, renal function is normal, lipids are well controlled.  I did not make any changes to her medication.   Adrian Prows, MD, [facility] 12/08/2019, 10:49 AM Office: [phone] Pager: [phone]

## 2019-12-16 ENCOUNTER — Ambulatory Visit: Payer: Medicare PPO

## 2019-12-16 ENCOUNTER — Other Ambulatory Visit: Payer: Self-pay

## 2019-12-16 DIAGNOSIS — Z8673 Personal history of transient ischemic attack (TIA), and cerebral infarction without residual deficits: Secondary | ICD-10-CM

## 2019-12-16 DIAGNOSIS — I6522 Occlusion and stenosis of left carotid artery: Secondary | ICD-10-CM | POA: Diagnosis not present

## 2019-12-16 DIAGNOSIS — I739 Peripheral vascular disease, unspecified: Secondary | ICD-10-CM

## 2019-12-21 NOTE — Progress Notes (Signed)
No obvious stenosis in bilateral lower extremity, bilateral ABI has remained stable since 2017.  However patient continues to have significant symptoms and physical examination is abnormal, will discuss more during peripheral arteriogram to confirm patency of the stent and to further evaluate peripheral arterial disease.

## 2020-01-19 ENCOUNTER — Other Ambulatory Visit: Payer: Self-pay | Admitting: Cardiology

## 2020-01-20 ENCOUNTER — Encounter: Payer: Self-pay | Admitting: Cardiology

## 2020-01-20 ENCOUNTER — Other Ambulatory Visit: Payer: Self-pay

## 2020-01-20 ENCOUNTER — Ambulatory Visit: Payer: Medicare PPO | Admitting: Cardiology

## 2020-01-20 VITALS — BP 130/70 | HR 61 | Resp 16 | Ht 63.0 in | Wt 108.8 lb

## 2020-01-20 DIAGNOSIS — I6522 Occlusion and stenosis of left carotid artery: Secondary | ICD-10-CM

## 2020-01-20 DIAGNOSIS — I739 Peripheral vascular disease, unspecified: Secondary | ICD-10-CM | POA: Diagnosis not present

## 2020-01-20 NOTE — Progress Notes (Signed)
Primary Physician/Referring:  Kathyrn Lass, MD  Patient ID: Madison Richards, female    DOB: Sep 15, 1934, 85 y.o.   MRN: [id number]  Chief Complaint  Patient presents with  . Follow-up    6 week  . PAD  . Carotid Stenosis   HPI:    Madison Richards  is a 85 y.o.  Caucasian female with peripheral arterial disease, hypertension, hyperlipidemia, small abdominal aortic aneurysm of 3 cm, prior tobacco use disorder, she has had right common iliac artery aneurysm stenting with Viabahn covered stents on 04/06/2012 and on 05/26/2014 had thrombotic event after she had stopped her antiplatelet therapy and underwent thrombectomy.She has remained "abstinant" with tobacco since Feb 2019.  She had recurrent stroke in Dec 2019, fortunately right arm weakness has resolved and no dysarthria.  I had seen her 6 weeks ago, she had complained that over the past few months she has markedly reduced her physical activity as her whole legs give away and also notices cramping especially at night in both legs.  She feels weak all the way from the hips down.  No bluish discoloration or ulceration in the feet.  I would also noticed abnormal physical exam with reduced pulses hence obtained further evaluation including Dopplers and ABI and presents for follow-up.  No new symptomatology.  Denies chest pain, denies any recurrent neurologic events, shortness of breath is remained stable, no PND or orthopnea.  Has chronic cough that is unchanged.  Past Medical History:  Diagnosis Date  . Generalized headaches    due to neck pain  . GERD (gastroesophageal reflux disease)   . Hyperlipidemia   . Hypertension   . Neck pain   . Osteoporosis   . Stroke [facility])    Past Surgical History:  Procedure Laterality Date  . ABDOMINAL ANGIOGRAM Bilateral 03/02/2012   Procedure: ABDOMINAL ANGIOGRAM;  Surgeon: Laverda Page, MD;  Location: [facility] CATH LAB;  Service: Cardiovascular;  Laterality: Bilateral;  . ABDOMINAL ANGIOGRAM  04/06/2012     Procedure: ABDOMINAL ANGIOGRAM;  Surgeon: Laverda Page, MD;  Location: [facility] CATH LAB;  Service: Cardiovascular;;  . abdominal tumor     removed in 1968/benign  . APPENDECTOMY    . AUGMENTATION MAMMAPLASTY  2000   REMOVED in 2000  . ILIAC ARTERY STENT     right side 2003 and 2014   Social History   Tobacco Use  . Smoking status: Former Smoker    Types: Cigarettes    Quit date: 02/07/2018    Years since quitting: 1.9  . Smokeless tobacco: Never Used  Substance Use Topics  . Alcohol use: Yes    Alcohol/week: 1.0 standard drink    Types: 1 Glasses of wine per week    Comment: daily   Marital Status: Widowed   ROS  Review of Systems  Cardiovascular: Positive for claudication (bilateral legs) and dyspnea on exertion. Negative for chest pain and leg swelling.  Respiratory: Positive for cough (chronic).   Musculoskeletal: Positive for muscle cramps.  Gastrointestinal: Negative for melena.   Objective   Vitals with BMI 01/20/2020 12/08/2019 12/06/2018  Height 5\' 3"  5\' 3"  5\' 3"   Weight 108 lbs 13 oz 107 lbs 108 lbs 2 oz  BMI 19.28 60.10 93.23  Systolic 557 322 025  Diastolic 70 45 80  Pulse 61 61 71     Physical Exam Constitutional:      Comments: She is moderately built and petite, in no acute distress.  HENT:     Head:  Atraumatic.  Eyes:     Conjunctiva/sclera: Conjunctivae normal.  Neck:     Thyroid: No thyromegaly.     Vascular: No JVD.  Cardiovascular:     Rate and Rhythm: Normal rate and regular rhythm.     Pulses:          Carotid pulses are 2+ on the right side with bruit and 2+ on the left side with bruit.      Femoral pulses are 1+ on the right side with bruit and 1+ on the left side with bruit.      Popliteal pulses are 0 on the right side and 0 on the left side.       Dorsalis pedis pulses are 1+ on the right side and 1+ on the left side.       Posterior tibial pulses are 0 on the right side and 0 on the left side.     Heart sounds: Normal heart  sounds. No murmur heard.  No gallop.      Comments: No leg edema, no JVD. Abdominal aortic pulsation present without tenderness (thin individual). No bruit.  Pulmonary:     Effort: Pulmonary effort is normal.     Breath sounds: Normal breath sounds.  Abdominal:     General: Bowel sounds are normal.     Palpations: Abdomen is soft.  Musculoskeletal:        General: Normal range of motion.     Cervical back: Neck supple.  Skin:    General: Skin is warm and dry.  Neurological:     Mental Status: She is alert.    Radiology: No results found.  Laboratory examination:   External labs:  Cholesterol, total 173.000 m 10/03/2019 HDL 81.000 mg 10/03/2019 LDL-C 64.000 mg 10/03/2019 Triglycerides 172.000 m 10/03/2019  A1C 5.800 % 02/09/2018  Hemoglobin 13.300 g/d 11/05/2018  Creatinine, Serum 0.820 mg/ 10/03/2019 Potassium 4.200 mm 10/03/2019 ALT (SGPT) 9.000 U/L 10/03/2019  Medications and allergies   Allergies  Allergen Reactions  . Fosamax [Alendronate Sodium]     Leg cramps  . Clindamycin/Lincomycin Rash    Rash in mouth  . Neosporin [Neomycin-Polymyxin-Gramicidin] Rash  . Penicillins Rash    Rash in mouth    Current Outpatient Medications on File Prior to Visit  Medication Sig Dispense Refill  . amLODipine-valsartan (EXFORGE) 5-160 MG tablet Take 1 tablet by mouth daily. 90 tablet 3  . aspirin EC 81 MG tablet Take 81 mg by mouth daily.    . Calcium Carbonate-Vitamin D (CALCIUM 600 + D PO) Take 1 tablet by mouth every other day.     . celecoxib (CELEBREX) 200 MG capsule Take 200 mg by mouth every other day. Every other day.    . Cholecalciferol (VITAMIN D3) 5000 UNITS TABS Take 1 tablet by mouth every Monday, Wednesday, and Friday.    . clopidogrel (PLAVIX) 75 MG tablet TAKE 1 TABLET BY MOUTH EVERY DAY 90 tablet 1  . Coenzyme Q10 (COQ10 PO) Take 200 mg by mouth.    . colestipol (COLESTID) 1 g tablet Take 1 g by mouth daily. Take 2 pills daily    . ezetimibe (ZETIA) 10 MG  tablet Take 10 mg by mouth daily.    . hydrochlorothiazide (MICROZIDE) 12.5 MG capsule Take 1 capsule (12.5 mg total) by mouth daily. 90 capsule 3  . loratadine (CLARITIN) 10 MG tablet Take 10 mg by mouth daily as needed for allergies.     . metoprolol tartrate (LOPRESSOR) 25 MG tablet TAKE  1 TABLET BY MOUTH TWICE A DAY 180 tablet 1  . Multiple Vitamin (MULTIVITAMIN WITH MINERALS) TABS Take 1 tablet by mouth daily.    . pravastatin (PRAVACHOL) 40 MG tablet Take 1 tablet (40 mg total) by mouth at bedtime. 30 tablet 3  . risedronate (ACTONEL) 35 MG tablet Take 35 mg by mouth every 7 (seven) days. with water on empty stomach, nothing by mouth or lie down for next 30 minutes.     No current facility-administered medications on file prior to visit.    Cardiac Studies:   Peripheral arteriogram:  04/06/2012: PTA and stenting of the right common and external iliac artery large aneurysm with implantation of a 8.0 x 100 mm Viabahn covered stent. 05/26/2014 with acute right leg ischemia S/P balloon PTA of the right EIA stent and thrombolysis. 3 vessel r/o below knee.  Echocardiogram 10/08/2016: 1. Left ventricle cavity is normal in size. Normal global wall motion. Elevated LAP. Grade II diastolic dysfunction Calculated EF 64%. 2. Mild biatrial enlargement. Lipomatous hypertrophy of the interatrial septum. 3. Right ventricle is mildly dilated with normal systolic function 4. Mild to moderate tricuspid regurgitation. Pulmonary artery systolic pressure is estimated at mm 30-35 Hg.   Lower Extremity Arterial Duplex 12/16/2019: No hemodynamically significant stenoses are identified in the right lower extremity arterial system. No hemodynamically significant stenoses are identified in the left lower extremity arterial system. Moderate degree of heterogeneous plaque noted in bilateral lower extremity.  This exam reveals mildly decreased perfusion of the right lower extremity, noted at the anterior tibial artery  level (ABI 0.92) and mildly decreased perfusion of the left lower extremity, noted at the anterior tibial artery level (ABI 0.88).  No significant change from 01/22/2016. Right iliac artery stent appears patent.  Abdominal Aortic Duplex 12/16/2019: Moderate plaque noted in the proximal aorta. Mild plaque noted in the mid aorta.  Abdominal aortic ectasia in the mid and distal aorta without aneurysm. Normal iliac artery velocity with multiphasic waveform.  Carotid artery duplex 12/16/2019: Minimal stenosis in the RICA (1-15%) due to homogenous plaque. Stenosis of mid to distal LICA (62-22%) due to homogenous plaque based on PSV. Bilateral antegrade vertebral artery flow. Follow up in one year is appropriate if clinically indicated. Compared to prior study dated 10/13/2017, no significant change.   EKG:    EKG 12/06/2018: Normal sinus rhythm at the rate of 69 bpm, left atrial enlargement, normal axis.  Nonspecific sagging ST changes.  Normal QT interval. No significant change from  10/14/2017. Marked Glade Stanford @ 48/min, not present.  Assessment     ICD-10-CM   1. Left carotid artery stenosis  I65.22 PCV CAROTID DUPLEX (BILATERAL)  2. PAD (peripheral artery disease) (HCC)  I73.9   3. Claudication in peripheral vascular disease (HCC)  I73.9     No orders of the defined types were placed in this encounter.  There are no discontinued medications.   Recommendations:   Madison Richards  is a 85 y.o.  Caucasian female with peripheral arterial disease, hypertension, hyperlipidemia, small abdominal aortic aneurysm of 3 cm, prior tobacco use disorder, she has had right common iliac artery aneurysm stenting with Viabahn covered stents on 04/06/2012 and on 05/26/2014 had thrombotic event after she had stopped her antiplatelet therapy and underwent thrombectomy.She has remained "abstinant" with tobacco since Feb 2019.  She had recurrent stroke in Dec 2019, fortunately right arm weakness has resolved and  no dysarthria.  On her last office visit 6 weeks ago I had felt that  the lower extremity pulses were reduced and hence obtain lower extremity duplex and also did carotid artery surveillance.  I reviewed the results of the test with the patient, fortunately ABI has remained stable, abdominal aortic duplex does not reveal any significant abnormality from baseline.  Hence I would continue present medical therapy.  I again discussed with her regarding strict abstinence from any kind of tobacco or nicotine products.  Otherwise she is on appropriate medical therapy, I will see her back in a year with repeat carotid duplex.   Adrian Prows, MD, [facility] 01/20/2020, 10:22 AM Office: [phone] Pager: [phone]

## 2020-04-05 ENCOUNTER — Other Ambulatory Visit: Payer: Self-pay | Admitting: Family Medicine

## 2020-04-05 DIAGNOSIS — Z1231 Encounter for screening mammogram for malignant neoplasm of breast: Secondary | ICD-10-CM

## 2020-04-19 ENCOUNTER — Other Ambulatory Visit: Payer: Self-pay | Admitting: Cardiology

## 2020-05-24 ENCOUNTER — Ambulatory Visit
Admission: RE | Admit: 2020-05-24 | Discharge: 2020-05-24 | Disposition: A | Discharge status: Home / Self Care | Payer: Medicare PPO | Source: Ambulatory Visit | Attending: Family Medicine | Admitting: Family Medicine

## 2020-05-24 ENCOUNTER — Other Ambulatory Visit: Payer: Self-pay

## 2020-05-24 DIAGNOSIS — Z1231 Encounter for screening mammogram for malignant neoplasm of breast: Secondary | ICD-10-CM

## 2020-07-20 ENCOUNTER — Other Ambulatory Visit: Payer: Self-pay | Admitting: Cardiology

## 2020-08-17 ENCOUNTER — Other Ambulatory Visit: Payer: Self-pay | Admitting: Cardiology

## 2020-10-02 ENCOUNTER — Other Ambulatory Visit: Payer: Self-pay | Admitting: Cardiology

## 2020-10-03 DIAGNOSIS — M810 Age-related osteoporosis without current pathological fracture: Secondary | ICD-10-CM | POA: Diagnosis not present

## 2020-10-03 DIAGNOSIS — D649 Anemia, unspecified: Secondary | ICD-10-CM | POA: Diagnosis not present

## 2020-10-03 DIAGNOSIS — E7800 Pure hypercholesterolemia, unspecified: Secondary | ICD-10-CM | POA: Diagnosis not present

## 2020-10-08 DIAGNOSIS — Z1389 Encounter for screening for other disorder: Secondary | ICD-10-CM | POA: Diagnosis not present

## 2020-10-08 DIAGNOSIS — Z0000 Encounter for general adult medical examination without abnormal findings: Secondary | ICD-10-CM | POA: Diagnosis not present

## 2020-10-10 ENCOUNTER — Other Ambulatory Visit: Payer: Self-pay | Admitting: Family Medicine

## 2020-10-10 DIAGNOSIS — E7800 Pure hypercholesterolemia, unspecified: Secondary | ICD-10-CM | POA: Diagnosis not present

## 2020-10-10 DIAGNOSIS — M25562 Pain in left knee: Secondary | ICD-10-CM | POA: Diagnosis not present

## 2020-10-10 DIAGNOSIS — I739 Peripheral vascular disease, unspecified: Secondary | ICD-10-CM | POA: Diagnosis not present

## 2020-10-10 DIAGNOSIS — Z87891 Personal history of nicotine dependence: Secondary | ICD-10-CM | POA: Diagnosis not present

## 2020-10-10 DIAGNOSIS — M810 Age-related osteoporosis without current pathological fracture: Secondary | ICD-10-CM

## 2020-10-10 DIAGNOSIS — Z8673 Personal history of transient ischemic attack (TIA), and cerebral infarction without residual deficits: Secondary | ICD-10-CM | POA: Diagnosis not present

## 2020-10-10 DIAGNOSIS — I700 Atherosclerosis of aorta: Secondary | ICD-10-CM | POA: Diagnosis not present

## 2020-10-15 ENCOUNTER — Other Ambulatory Visit: Payer: Self-pay

## 2020-10-15 ENCOUNTER — Ambulatory Visit
Admission: RE | Admit: 2020-10-15 | Discharge: 2020-10-15 | Disposition: A | Discharge status: Home / Self Care | Payer: Medicare PPO | Source: Ambulatory Visit | Attending: Family Medicine | Admitting: Family Medicine

## 2020-10-15 DIAGNOSIS — M85852 Other specified disorders of bone density and structure, left thigh: Secondary | ICD-10-CM | POA: Diagnosis not present

## 2020-10-15 DIAGNOSIS — M810 Age-related osteoporosis without current pathological fracture: Secondary | ICD-10-CM

## 2020-11-28 DIAGNOSIS — H348312 Tributary (branch) retinal vein occlusion, right eye, stable: Secondary | ICD-10-CM | POA: Diagnosis not present

## 2020-11-28 DIAGNOSIS — H40013 Open angle with borderline findings, low risk, bilateral: Secondary | ICD-10-CM | POA: Diagnosis not present

## 2020-12-31 ENCOUNTER — Other Ambulatory Visit: Payer: Self-pay | Admitting: Cardiology

## 2021-01-18 ENCOUNTER — Other Ambulatory Visit: Payer: Self-pay

## 2021-01-18 ENCOUNTER — Ambulatory Visit: Payer: Medicare PPO

## 2021-01-18 DIAGNOSIS — Z8673 Personal history of transient ischemic attack (TIA), and cerebral infarction without residual deficits: Secondary | ICD-10-CM | POA: Diagnosis not present

## 2021-01-18 DIAGNOSIS — I6522 Occlusion and stenosis of left carotid artery: Secondary | ICD-10-CM

## 2021-01-18 DIAGNOSIS — I6523 Occlusion and stenosis of bilateral carotid arteries: Secondary | ICD-10-CM

## 2021-01-20 NOTE — Progress Notes (Signed)
Mild disease in bilateral common carotid arteries. Discuss on OV soon

## 2021-01-24 ENCOUNTER — Ambulatory Visit: Payer: Medicare PPO | Admitting: Cardiology

## 2021-01-24 ENCOUNTER — Encounter: Payer: Self-pay | Admitting: Cardiology

## 2021-01-24 ENCOUNTER — Other Ambulatory Visit: Payer: Self-pay

## 2021-01-24 VITALS — BP 116/44 | HR 67 | Temp 97.8°F | Resp 17 | Ht 63.0 in | Wt 102.8 lb

## 2021-01-24 DIAGNOSIS — E7800 Pure hypercholesterolemia, unspecified: Secondary | ICD-10-CM | POA: Diagnosis not present

## 2021-01-24 DIAGNOSIS — I739 Peripheral vascular disease, unspecified: Secondary | ICD-10-CM

## 2021-01-24 NOTE — Progress Notes (Signed)
Primary Physician/Referring:  Kathyrn Lass, MD  Patient ID: Madison Richards, female    DOB: 1934/12/11, 86 y.o.   MRN: [id number]  Chief Complaint  Patient presents with   CAROTID STENOSIS   Hypertension   Follow-up    1 YEAR   HPI:    Madison Richards  is a 86 y.o.  Caucasian female with peripheral arterial disease, hypertension, hyperlipidemia, abdominal aortic ectasia,, prior tobacco use disorder, she has had right common iliac artery aneurysm stenting with Viabahn covered stents on 04/06/2012 and on 05/26/2014 had thrombotic event after she had stopped her antiplatelet therapy and underwent thrombectomy. She has remained "abstinant"  with tobacco since Feb 2019.  She had recurrent stroke in Dec 2019, fortunately right arm weakness has resolved and no dysarthria.  Presently doing well without recurrence of claudication, she has been walking regularly  Denies chest pain, denies any recurrent neurologic events, shortness of breath is remained stable, no PND or orthopnea.  Has chronic cough that is unchanged.  Past Medical History:  Diagnosis Date   Generalized headaches    due to neck pain   GERD (gastroesophageal reflux disease)    Hyperlipidemia    Hypertension    Neck pain    Osteoporosis    Stroke [facility])    Past Surgical History:  Procedure Laterality Date   ABDOMINAL ANGIOGRAM Bilateral 03/02/2012   Procedure: ABDOMINAL ANGIOGRAM;  Surgeon: Laverda Page, MD;  Location: [facility] CATH LAB;  Service: Cardiovascular;  Laterality: Bilateral;   ABDOMINAL ANGIOGRAM  04/06/2012   Procedure: ABDOMINAL ANGIOGRAM;  Surgeon: Laverda Page, MD;  Location: [facility] CATH LAB;  Service: Cardiovascular;;   abdominal tumor     removed in 1968/benign   APPENDECTOMY     AUGMENTATION MAMMAPLASTY  2000   REMOVED in [place]     right side 2003 and 2014   Social History   Tobacco Use   Smoking status: Former    Types: Cigarettes    Quit date: 02/07/2018    Years since  quitting: 2.9   Smokeless tobacco: Never  Substance Use Topics   Alcohol use: Yes    Alcohol/week: 1.0 standard drink    Types: 1 Glasses of wine per week    Comment: daily   Marital Status: Widowed   ROS  Review of Systems  Cardiovascular:  Positive for dyspnea on exertion. Negative for chest pain, claudication and leg swelling.  Respiratory:  Positive for cough (chronic).   Musculoskeletal:  Negative for muscle cramps.  Gastrointestinal:  Negative for melena.  Objective   Vitals with BMI 01/24/2021 01/20/2020 12/08/2019  Height 5\' 3"  5\' 3"  5\' 3"   Weight 102 lbs 13 oz 108 lbs 13 oz 107 lbs  BMI 18.21 85.63 14.97  Systolic 026 378 588  Diastolic 44 70 45  Pulse 67 61 61     Physical Exam Constitutional:      Comments: She is moderately built and petite, in no acute distress.  Neck:     Thyroid: No thyromegaly.     Vascular: No carotid bruit or JVD.  Cardiovascular:     Rate and Rhythm: Normal rate and regular rhythm.     Pulses:          Femoral pulses are 1+ on the right side with bruit and 1+ on the left side with bruit.      Popliteal pulses are 0 on the right side and 0 on the left side.  Dorsalis pedis pulses are 1+ on the right side and 1+ on the left side.       Posterior tibial pulses are 0 on the right side and 0 on the left side.     Heart sounds: Normal heart sounds. No murmur heard.   No gallop.  Pulmonary:     Effort: Pulmonary effort is normal.     Breath sounds: Normal breath sounds.  Abdominal:     General: Bowel sounds are normal.     Palpations: Abdomen is soft.  Musculoskeletal:     Right lower leg: No edema.     Left lower leg: No edema.  Skin:    General: Skin is warm and dry.   Radiology: No results found.  Laboratory examination:   External labs:  Cholesterol, total 178.000 m 10/03/2020 HDL 84.000 mg 10/03/2020 LDL 73.000 mg 10/03/2020 Triglycerides 124.000 m 10/03/2020  A1C 5.800 % 02/09/2018  Hemoglobin 13.600 g/d  10/03/2020  Creatinine, Serum 0.790 mg/ 10/03/2020 Potassium 3.900 mm 10/03/2020 ALT (SGPT) 12.000 U/L 10/03/2020   Medications and allergies   Allergies  Allergen Reactions   Bacitracin-Polymyxin B     Other reaction(s): AREA APPLIED TO SPREAD   Fosamax [Alendronate Sodium]     Leg cramps   Clindamycin/Lincomycin Rash    Rash in mouth   Neosporin [Neomycin-Polymyxin-Gramicidin] Rash   Penicillins Rash    Rash in mouth    Current Outpatient Medications on File Prior to Visit  Medication Sig Dispense Refill   amLODipine-valsartan (EXFORGE) 5-160 MG tablet TAKE 1 TABLET BY MOUTH DAILY 90 tablet 3   Calcium Carbonate-Vitamin D (CALCIUM 600 + D PO) Take 1 tablet by mouth every other day.      celecoxib (CELEBREX) 200 MG capsule Take 200 mg by mouth as needed for mild pain. Every other day.     Cholecalciferol (VITAMIN D3) 5000 UNITS TABS Take 1 tablet by mouth every Monday, Wednesday, and Friday.     clopidogrel (PLAVIX) 75 MG tablet TAKE 1 TABLET BY MOUTH EVERY DAY 90 tablet 1   Coenzyme Q10 (COQ10 PO) Take 200 mg by mouth.     colestipol (COLESTID) 1 g tablet Take 1 g by mouth daily. Take 2 pills daily     ezetimibe (ZETIA) 10 MG tablet Take 10 mg by mouth daily.     hydrochlorothiazide (MICROZIDE) 12.5 MG capsule TAKE 1 CAPSULE(12.5 MG) BY MOUTH DAILY 90 capsule 3   loratadine (CLARITIN) 10 MG tablet Take 10 mg by mouth daily as needed for allergies.      metoprolol tartrate (LOPRESSOR) 25 MG tablet TAKE 1 TABLET BY MOUTH TWICE A DAY 180 tablet 1   Multiple Vitamin (MULTIVITAMIN WITH MINERALS) TABS Take 1 tablet by mouth daily.     pravastatin (PRAVACHOL) 40 MG tablet Take 1 tablet (40 mg total) by mouth at bedtime. 30 tablet 3   risedronate (ACTONEL) 35 MG tablet Take 35 mg by mouth every 7 (seven) days. with water on empty stomach, nothing by mouth or lie down for next 30 minutes.     No current facility-administered medications on file prior to visit.    Cardiac Studies:    Peripheral arteriogram:  04/06/2012: PTA and stenting of the right common and external iliac artery large aneurysm with implantation of a 8.0 x 100 mm Viabahn covered stent. 05/26/2014 with acute right leg ischemia S/P balloon PTA of the right EIA stent and thrombolysis. 3 vessel r/o below knee.  Echocardiogram 10/08/2016: 1. Left ventricle cavity is  normal in size. Normal global wall motion. Elevated LAP. Grade II diastolic dysfunction Calculated EF 64%. 2. Mild biatrial enlargement. Lipomatous hypertrophy of the interatrial septum. 3. Right ventricle is mildly dilated with normal systolic function 4. Mild to moderate tricuspid regurgitation. Pulmonary artery systolic pressure is estimated at mm 30-35 Hg.   Lower Extremity Arterial Duplex 12/16/2019: No hemodynamically significant stenoses are identified in the right lower extremity arterial system. No hemodynamically significant stenoses are identified in the left lower extremity arterial system. Moderate degree of heterogeneous plaque noted in bilateral lower extremity.  This exam reveals mildly decreased perfusion of the right lower extremity, noted at the anterior tibial artery level (ABI 0.92) and mildly decreased perfusion of the left lower extremity, noted at the anterior tibial artery level (ABI 0.88).  No significant change from 01/22/2016. Right iliac artery stent appears patent.  Abdominal Aortic Duplex 12/16/2019: Moderate plaque noted in the proximal aorta. Mild plaque noted in the mid aorta.  Abdominal aortic ectasia in the mid and distal aorta without aneurysm. Normal iliac artery velocity with multiphasic waveform.  Carotid artery duplex 01/18/2021: Duplex suggests stenosis in the right internal carotid artery (<50%). Duplex suggests stenosis in the left internal carotid artery (<50%). There is mild to moderate heterogeneous plaque in the bilateral common carotid arteries L>R.  Antegrade right vertebral artery flow. Left  vertebral artery flow is not visualized. No significant change from 12/16/2019. Follow up studies if clinically indicated.  EKG:    EKG 01/24/2021: Normal sinus rhythm at rate of 61 bpm, left atrial enlargement, normal axis.  Poor R wave progression, cannot exclude anteroseptal infarct old.  Single PVC.  Low-voltage complexes.  Pulmonary disease pattern.  No significant change from 12/06/2018.  Assessment     ICD-10-CM   1. PAD (peripheral artery disease) (HCC)  I73.9 EKG 12-Lead    2. Claudication in peripheral vascular disease (HCC)  I73.9     3. Hypercholesteremia  E78.00       No orders of the defined types were placed in this encounter.  Medications Discontinued During This Encounter  Medication Reason   loratadine (CLARITIN) 10 MG tablet    aspirin EC 81 MG tablet Change in therapy     Recommendations:   Madison Richards  is a 86 y.o.  Caucasian female with peripheral arterial disease, hypertension, hyperlipidemia, abdominal aortic ectasia,, prior tobacco use disorder, she has had right common iliac artery aneurysm stenting with Viabahn covered stents on 04/06/2012 and on 05/26/2014 had thrombotic event after she had stopped her antiplatelet therapy and underwent thrombectomy. She has remained "abstinant"  with tobacco since Feb 2019.  She had recurrent stroke in Dec 2019, fortunately right arm weakness has resolved and no dysarthria.  Presently doing well without recurrence of claudication, she has been walking regularly, she does have arthritis that limits her.  No chest pain, no PND or orthopnea.  Physical examination is unchanged.  She does not have any further carotid bruit, carotid duplex does not reveal significant disease.  Lipids are well controlled, blood pressure is also well controlled.  As she has remained stable, as she has quit smoking completely, will discontinue aspirin and continue Plavix indefinitely in view of presence of Viabahn stents in the right common iliac  artery.  I will see her back in a year or sooner if problems.   Adrian Prows, MD, [facility] 01/24/2021, 10:47 AM Office: [phone] Pager: [phone]

## 2021-03-31 ENCOUNTER — Other Ambulatory Visit: Payer: Self-pay | Admitting: Cardiology

## 2021-04-17 ENCOUNTER — Other Ambulatory Visit: Payer: Self-pay | Admitting: Family Medicine

## 2021-04-17 DIAGNOSIS — Z1231 Encounter for screening mammogram for malignant neoplasm of breast: Secondary | ICD-10-CM

## 2021-05-27 ENCOUNTER — Ambulatory Visit
Admission: RE | Admit: 2021-05-27 | Discharge: 2021-05-27 | Disposition: A | Discharge status: Home / Self Care | Payer: Medicare PPO | Source: Ambulatory Visit | Attending: Family Medicine | Admitting: Family Medicine

## 2021-05-27 DIAGNOSIS — Z1231 Encounter for screening mammogram for malignant neoplasm of breast: Secondary | ICD-10-CM

## 2021-07-21 ENCOUNTER — Other Ambulatory Visit: Payer: Self-pay | Admitting: Cardiology

## 2021-08-10 ENCOUNTER — Other Ambulatory Visit: Payer: Self-pay | Admitting: Cardiology

## 2021-09-23 ENCOUNTER — Other Ambulatory Visit: Payer: Self-pay | Admitting: Cardiology

## 2021-10-01 DIAGNOSIS — M810 Age-related osteoporosis without current pathological fracture: Secondary | ICD-10-CM | POA: Diagnosis not present

## 2021-10-01 DIAGNOSIS — I10 Essential (primary) hypertension: Secondary | ICD-10-CM | POA: Diagnosis not present

## 2021-10-01 DIAGNOSIS — E7800 Pure hypercholesterolemia, unspecified: Secondary | ICD-10-CM | POA: Diagnosis not present

## 2021-10-14 DIAGNOSIS — E7800 Pure hypercholesterolemia, unspecified: Secondary | ICD-10-CM | POA: Diagnosis not present

## 2021-10-14 DIAGNOSIS — R636 Underweight: Secondary | ICD-10-CM | POA: Diagnosis not present

## 2021-10-14 DIAGNOSIS — I10 Essential (primary) hypertension: Secondary | ICD-10-CM | POA: Diagnosis not present

## 2021-10-14 DIAGNOSIS — E46 Unspecified protein-calorie malnutrition: Secondary | ICD-10-CM | POA: Diagnosis not present

## 2021-10-14 DIAGNOSIS — I700 Atherosclerosis of aorta: Secondary | ICD-10-CM | POA: Diagnosis not present

## 2021-10-14 DIAGNOSIS — Z8673 Personal history of transient ischemic attack (TIA), and cerebral infarction without residual deficits: Secondary | ICD-10-CM | POA: Diagnosis not present

## 2021-10-14 DIAGNOSIS — M810 Age-related osteoporosis without current pathological fracture: Secondary | ICD-10-CM | POA: Diagnosis not present

## 2021-10-14 DIAGNOSIS — I739 Peripheral vascular disease, unspecified: Secondary | ICD-10-CM | POA: Diagnosis not present

## 2021-10-22 DIAGNOSIS — D7589 Other specified diseases of blood and blood-forming organs: Secondary | ICD-10-CM | POA: Diagnosis not present

## 2021-10-22 DIAGNOSIS — Z0000 Encounter for general adult medical examination without abnormal findings: Secondary | ICD-10-CM | POA: Diagnosis not present

## 2021-10-22 DIAGNOSIS — Z23 Encounter for immunization: Secondary | ICD-10-CM | POA: Diagnosis not present

## 2021-10-22 DIAGNOSIS — Z1389 Encounter for screening for other disorder: Secondary | ICD-10-CM | POA: Diagnosis not present

## 2021-12-02 DIAGNOSIS — H34831 Tributary (branch) retinal vein occlusion, right eye, with macular edema: Secondary | ICD-10-CM | POA: Diagnosis not present

## 2021-12-31 ENCOUNTER — Other Ambulatory Visit: Payer: Self-pay | Admitting: Cardiology

## 2022-01-16 DIAGNOSIS — N3001 Acute cystitis with hematuria: Secondary | ICD-10-CM | POA: Diagnosis not present

## 2022-01-23 DIAGNOSIS — N3001 Acute cystitis with hematuria: Secondary | ICD-10-CM | POA: Diagnosis not present

## 2022-01-23 DIAGNOSIS — R300 Dysuria: Secondary | ICD-10-CM | POA: Diagnosis not present

## 2022-01-24 ENCOUNTER — Encounter: Payer: Self-pay | Admitting: Cardiology

## 2022-01-24 ENCOUNTER — Ambulatory Visit: Payer: Medicare PPO | Admitting: Cardiology

## 2022-01-24 VITALS — BP 130/58 | HR 51 | Ht 63.0 in | Wt 95.2 lb

## 2022-01-24 DIAGNOSIS — I10 Essential (primary) hypertension: Secondary | ICD-10-CM | POA: Diagnosis not present

## 2022-01-24 DIAGNOSIS — I739 Peripheral vascular disease, unspecified: Secondary | ICD-10-CM

## 2022-01-24 DIAGNOSIS — E7800 Pure hypercholesterolemia, unspecified: Secondary | ICD-10-CM

## 2022-01-24 NOTE — Progress Notes (Signed)
Primary Physician/Referring:  Kathyrn Lass, MD  Patient ID: Madison Richards, female    DOB: [birth year], 87 y.o.   MRN: [id number]  Chief Complaint  Patient presents with   PAD   Hyperlipidemia   Follow-up    1 year   HPI:    Madison Richards  is a 87 y.o.  Caucasian female with peripheral arterial disease, hypertension, hyperlipidemia, abdominal aortic ectasia,, prior tobacco use disorder, she has had right common iliac artery aneurysm stenting with Viabahn covered stents on 04/06/2012 and on 05/26/2014 had thrombotic event after she had stopped her antiplatelet therapy and underwent thrombectomy. She has remained "abstinant"  with tobacco since Feb 2019. She had recurrent stroke in Dec 2019, fortunately right arm weakness has resolved and no dysarthria.    Presently doing well without recurrence of claudication, she has been walking regularly Denies chest pain, denies any recurrent neurologic events, shortness of breath is remained stable, no PND or orthopnea.  Has chronic cough that is unchanged.  Past Medical History:  Diagnosis Date   Generalized headaches    due to neck pain   GERD (gastroesophageal reflux disease)    Hyperlipidemia    Hypertension    Neck pain    Osteoporosis    Stroke [facility])    Past Surgical History:  Procedure Laterality Date   ABDOMINAL ANGIOGRAM Bilateral 03/02/2012   Procedure: ABDOMINAL ANGIOGRAM;  Surgeon: Laverda Page, MD;  Location: [facility] CATH LAB;  Service: Cardiovascular;  Laterality: Bilateral;   ABDOMINAL ANGIOGRAM  04/06/2012   Procedure: ABDOMINAL ANGIOGRAM;  Surgeon: Laverda Page, MD;  Location: [facility] CATH LAB;  Service: Cardiovascular;;   abdominal tumor     removed in 1968/benign   APPENDECTOMY     AUGMENTATION MAMMAPLASTY  2000   REMOVED in [place]     right side 2003 and 2014   Social History   Tobacco Use   Smoking status: Former    Types: Cigarettes    Quit date: 02/07/2018    Years since quitting: 3.9    Smokeless tobacco: Never  Substance Use Topics   Alcohol use: Yes    Alcohol/week: 1.0 standard drink of alcohol    Types: 1 Glasses of wine per week    Comment: daily   Marital Status: Widowed   ROS  Review of Systems  Cardiovascular:  Positive for dyspnea on exertion. Negative for chest pain, claudication and leg swelling.  Respiratory:  Positive for cough (chronic).   Musculoskeletal:  Negative for muscle cramps.  Gastrointestinal:  Negative for melena.   Objective      01/24/2022   12:28 PM 01/24/2021    9:54 AM 01/20/2020    9:32 AM  Vitals with BMI  Height '5\' 3"'$  '5\' 3"'$  '5\' 3"'$   Weight 95 lbs 3 oz 102 lbs 13 oz 108 lbs 13 oz  BMI 16.87 63.14 97.02  Systolic 637 858 850  Diastolic 58 44 70  Pulse 51 67 61     Physical Exam Constitutional:      Comments: She is moderately built and petite, in no acute distress.  Neck:     Thyroid: No thyromegaly.     Vascular: No carotid bruit or JVD.  Cardiovascular:     Rate and Rhythm: Normal rate and regular rhythm.     Pulses:          Femoral pulses are 1+ on the right side with bruit and 1+ on the left side with  bruit.      Popliteal pulses are 0 on the right side and 0 on the left side.       Dorsalis pedis pulses are 1+ on the right side and 1+ on the left side.       Posterior tibial pulses are 0 on the right side and 0 on the left side.     Heart sounds: Normal heart sounds. No murmur heard.    No gallop.  Pulmonary:     Effort: Pulmonary effort is normal.     Breath sounds: Examination of the right-middle field reveals rales. Examination of the left-middle field reveals rales. Examination of the right-lower field reveals rales. Rales present.  Abdominal:     General: Bowel sounds are normal.     Palpations: Abdomen is soft.  Musculoskeletal:     Right lower leg: No edema.     Left lower leg: No edema.    Radiology: No results found.  Laboratory examination:   External labs:  Cholesterol, total 172.000 m  10/01/2021 HDL 77.000 mg 10/01/2021 LDL 72.000 mg 10/01/2021 Triglycerides 137.000 m 10/01/2021  Hemoglobin 13.700 g/d 10/14/2021  Creatinine, Serum 0.780 mg/ 10/01/2021 Potassium 4.200 mm 10/01/2021 ALT (SGPT) 16.000 U/L 10/01/2021  TSH 1.860 10/14/2021  Medications and allergies   Allergies  Allergen Reactions   Bacitracin-Polymyxin B     Other reaction(s): AREA APPLIED TO SPREAD   Fosamax [Alendronate Sodium]     Leg cramps   Clindamycin/Lincomycin Rash    Rash in mouth   Neosporin [Neomycin-Polymyxin-Gramicidin] Rash   Penicillins Rash    Rash in mouth     Current Outpatient Medications:    amLODipine-valsartan (EXFORGE) 5-160 MG tablet, TAKE 1 TABLET BY MOUTH DAILY, Disp: 90 tablet, Rfl: 3   Calcium Carbonate-Vitamin D (CALCIUM 600 + D PO), Take 1 tablet by mouth every other day. , Disp: , Rfl:    celecoxib (CELEBREX) 200 MG capsule, Take 200 mg by mouth as needed for mild pain. Every other day., Disp: , Rfl:    Cholecalciferol (VITAMIN D3) 5000 UNITS TABS, Take 1 tablet by mouth every Monday, Wednesday, and Friday., Disp: , Rfl:    clopidogrel (PLAVIX) 75 MG tablet, TAKE 1 TABLET BY MOUTH EVERY DAY, Disp: 90 tablet, Rfl: 1   Coenzyme Q10 (COQ10 PO), Take 200 mg by mouth., Disp: , Rfl:    colestipol (COLESTID) 1 g tablet, Take 1 g by mouth daily. Take 2 pills daily, Disp: , Rfl:    ezetimibe (ZETIA) 10 MG tablet, Take 10 mg by mouth daily., Disp: , Rfl:    hydrochlorothiazide (MICROZIDE) 12.5 MG capsule, TAKE 1 CAPSULE(12.5 MG) BY MOUTH DAILY, Disp: 90 capsule, Rfl: 3   loratadine (CLARITIN) 10 MG tablet, Take 10 mg by mouth daily as needed for allergies. , Disp: , Rfl:    metoprolol tartrate (LOPRESSOR) 25 MG tablet, TAKE 1 TABLET BY MOUTH TWICE A DAY, Disp: 180 tablet, Rfl: 1   Multiple Vitamin (MULTIVITAMIN WITH MINERALS) TABS, Take 1 tablet by mouth daily., Disp: , Rfl:    pravastatin (PRAVACHOL) 40 MG tablet, Take 1 tablet (40 mg total) by mouth at bedtime., Disp: 30 tablet,  Rfl: 3   risedronate (ACTONEL) 35 MG tablet, Take 35 mg by mouth every 7 (seven) days. with water on empty stomach, nothing by mouth or lie down for next 30 minutes., Disp: , Rfl:     Cardiac Studies:   Peripheral arteriogram:  04/06/2012: PTA and stenting of the right common and external  iliac artery large aneurysm with implantation of a 8.0 x 100 mm Viabahn covered stent. 05/26/2014 with acute right leg ischemia S/P balloon PTA of the right EIA stent and thrombolysis. 3 vessel r/o below knee.  Echocardiogram 10/08/2016: 1. Left ventricle cavity is normal in size. Normal global wall motion. Elevated LAP. Grade II diastolic dysfunction Calculated EF 64%. 2. Mild biatrial enlargement. Lipomatous hypertrophy of the interatrial septum. 3. Right ventricle is mildly dilated with normal systolic function 4. Mild to moderate tricuspid regurgitation. Pulmonary artery systolic pressure is estimated at mm 30-35 Hg.   Lower Extremity Arterial Duplex 12/16/2019: No hemodynamically significant stenoses are identified in the right lower extremity arterial system. No hemodynamically significant stenoses are identified in the left lower extremity arterial system. Moderate degree of heterogeneous plaque noted in bilateral lower extremity.  This exam reveals mildly decreased perfusion of the right lower extremity, noted at the anterior tibial artery level (ABI 0.92) and mildly decreased perfusion of the left lower extremity, noted at the anterior tibial artery level (ABI 0.88).  No significant change from 01/22/2016. Right iliac artery stent appears patent.  Abdominal Aortic Duplex 12/16/2019: Moderate plaque noted in the proximal aorta. Mild plaque noted in the mid aorta.  Abdominal aortic ectasia in the mid and distal aorta without aneurysm. Normal iliac artery velocity with multiphasic waveform.  Carotid artery duplex 01/18/2021: Duplex suggests stenosis in the right internal carotid artery (<50%). Duplex  suggests stenosis in the left internal carotid artery (<50%). There is mild to moderate heterogeneous plaque in the bilateral common carotid arteries L>R.  Antegrade right vertebral artery flow. Left vertebral artery flow is not visualized. No significant change from 12/16/2019. Follow up studies if clinically indicated.  EKG:    EKG 01/24/2022: Normal sinus rhythm with rate of 62 bpm, left atrial enlargement, left axis deviation, left anterior fascicular block.  Poor R wave progression, cannot exclude anteroseptal infarct old.  Nonspecific T abnormality.  PVCs (2).  Compared to 01/24/2021, PVCs  Assessment     ICD-10-CM   1. PAD (peripheral artery disease) (HCC)  I73.9 EKG 12-Lead    2. Hypercholesteremia  E78.00     3. Primary hypertension  I10       No orders of the defined types were placed in this encounter.  There are no discontinued medications.    Recommendations:   Madison Richards  is a 87 y.o.  Caucasian female with peripheral arterial disease, hypertension, hyperlipidemia, abdominal aortic ectasia,, prior tobacco use disorder, she has had right common iliac artery aneurysm stenting with Viabahn covered stents on 04/06/2012 and on 05/26/2014 had thrombotic event after she had stopped her antiplatelet therapy and underwent thrombectomy. She has remained "abstinant"  with tobacco since Feb 2019. She had recurrent stroke in Dec 2019, fortunately right arm weakness has resolved and no dysarthria.    1. PAD (peripheral artery disease) ([place]) Patient asymptomatic with regard to peripheral arterial disease, no change in physical exam, she does have very faint pedal pulses and 1+ bilateral femoral artery pulses with bruit that is unchanged from prior years.  No limb-threatening ischemia, she is turned 87 now and still continues to remain fairly active.  With regard to both peripheral arterial disease and stroke, she is presently on Plavix and has been doing well.  External labs reviewed, CBC  has remained stable.  2. Hypercholesteremia I reviewed her external labs, lipids under excellent control.  She is on a low-dose of a low intensity statin with excellent control of lipids.  Continue the same.  3. Primary hypertension Blood pressure under excellent control.  She is on Exforge for blood pressure control.  No changes were done today.  Renal function is normal.  I will see her back in a year or sooner if problems.  She will contact me if she develops any symptoms of claudication, chest pain or palpitations or worsening dyspnea.    Adrian Prows, MD, [facility] 01/24/2022, 1:12 PM Office: [phone] Pager: [phone]

## 2022-01-28 DIAGNOSIS — R3129 Other microscopic hematuria: Secondary | ICD-10-CM | POA: Diagnosis not present

## 2022-01-28 DIAGNOSIS — R300 Dysuria: Secondary | ICD-10-CM | POA: Diagnosis not present

## 2022-01-28 DIAGNOSIS — R1013 Epigastric pain: Secondary | ICD-10-CM | POA: Diagnosis not present

## 2022-02-26 ENCOUNTER — Other Ambulatory Visit: Payer: Self-pay

## 2022-02-26 ENCOUNTER — Emergency Department (HOSPITAL_BASED_OUTPATIENT_CLINIC_OR_DEPARTMENT_OTHER): Payer: Medicare PPO

## 2022-02-26 ENCOUNTER — Emergency Department (HOSPITAL_BASED_OUTPATIENT_CLINIC_OR_DEPARTMENT_OTHER)
Admission: EM | Admit: 2022-02-26 | Discharge: 2022-02-26 | Disposition: A | Discharge status: Home / Self Care | Payer: Medicare PPO | Attending: Emergency Medicine | Admitting: Emergency Medicine

## 2022-02-26 ENCOUNTER — Encounter (HOSPITAL_BASED_OUTPATIENT_CLINIC_OR_DEPARTMENT_OTHER): Payer: Self-pay

## 2022-02-26 DIAGNOSIS — R0602 Shortness of breath: Secondary | ICD-10-CM | POA: Diagnosis not present

## 2022-02-26 DIAGNOSIS — J441 Chronic obstructive pulmonary disease with (acute) exacerbation: Secondary | ICD-10-CM | POA: Diagnosis not present

## 2022-02-26 DIAGNOSIS — J069 Acute upper respiratory infection, unspecified: Secondary | ICD-10-CM

## 2022-02-26 DIAGNOSIS — Z7901 Long term (current) use of anticoagulants: Secondary | ICD-10-CM | POA: Diagnosis not present

## 2022-02-26 DIAGNOSIS — Z1152 Encounter for screening for COVID-19: Secondary | ICD-10-CM | POA: Insufficient documentation

## 2022-02-26 LAB — RESP PANEL BY RT-PCR (RSV, FLU A&B, COVID)  RVPGX2
Influenza A by PCR: NEGATIVE
Influenza B by PCR: NEGATIVE
Resp Syncytial Virus by PCR: NEGATIVE
SARS Coronavirus 2 by RT PCR: NEGATIVE

## 2022-02-26 MED ORDER — DOXYCYCLINE HYCLATE 100 MG PO CAPS: 100.0000 mg | ORAL_CAPSULE | Freq: Two times a day (BID) | ORAL | 0 refills | Status: DC

## 2022-02-26 MED ORDER — GUAIFENESIN-DM 100-10 MG/5ML PO SYRP: 10.0000 mL | ORAL_SOLUTION | Freq: Four times a day (QID) | ORAL | 0 refills | Status: DC | PRN

## 2022-02-26 MED ORDER — GUAIFENESIN-DM 100-10 MG/5ML PO SYRP
10.0000 mL | ORAL_SOLUTION | Freq: Once | ORAL | Status: AC
  Administered 2022-02-26: 10 mL via ORAL
  Filled 2022-02-26: qty 10

## 2022-02-26 MED ORDER — PREDNISONE 20 MG PO TABS: 20.0000 mg | ORAL_TABLET | Freq: Every day | ORAL | 0 refills | Status: AC

## 2022-02-26 MED ORDER — METHYLPREDNISOLONE SODIUM SUCC 125 MG IJ SOLR
125.0000 mg | Freq: Once | INTRAMUSCULAR | Status: AC
  Administered 2022-02-26: 125 mg via INTRAVENOUS
  Filled 2022-02-26: qty 2

## 2022-02-26 MED ORDER — IPRATROPIUM-ALBUTEROL 0.5-2.5 (3) MG/3ML IN SOLN
3.0000 mL | Freq: Once | RESPIRATORY_TRACT | Status: AC
  Administered 2022-02-26: 3 mL via RESPIRATORY_TRACT
  Filled 2022-02-26: qty 3

## 2022-02-26 MED ORDER — ALBUTEROL SULFATE HFA 108 (90 BASE) MCG/ACT IN AERS
2.0000 | INHALATION_SPRAY | RESPIRATORY_TRACT | Status: DC | PRN
  Administered 2022-02-26: 2 via RESPIRATORY_TRACT
  Filled 2022-02-26: qty 6.7

## 2022-02-26 NOTE — ED Triage Notes (Signed)
Pt reports to ER via POV reporting cough x 5 days. Coughing up green tinge sputum. States that this morning she couldn't get her breath and felt like she heart was racing.

## 2022-02-26 NOTE — ED Provider Notes (Signed)
[facility] EMERGENCY DEPARTMENT Provider Note   CSN: [id number] Arrival date & time: 02/26/22  4854     History  Chief Complaint  Patient presents with   Shortness of Breath    Madison Richards is a 88 y.o. female.  Pt is a 88 yo female presenting for nasal congestion, sinus pressure, and productive cough with thick green sputum x 5 days. Admits to worsening sob this morning and difficulty taking a deep breath without coughing. Admits to sick contacts over the holiday. Admits to previous smoking hx from teenage years up until 2 years ago, about a pack a day. No recent antibiotic use. No home inhalers. Denies fevers.  The history is provided by the patient. No language interpreter was used.  Shortness of Breath Associated symptoms: cough and sore throat   Associated symptoms: no abdominal pain, no chest pain, no ear pain, no fever, no rash and no vomiting        Home Medications Prior to Admission medications   Medication Sig Start Date End Date Taking? Authorizing Provider  doxycycline (VIBRAMYCIN) 100 MG capsule Take 1 capsule (100 mg total) by mouth 2 (two) times daily. 08/13/01  Yes Campbell Stall P, DO  guaiFENesin-dextromethorphan (ROBITUSSIN DM) 100-10 MG/5ML syrup Take 10 mLs by mouth every 6 (six) hours as needed for cough. 5/00/93  Yes Campbell Stall P, DO  predniSONE (DELTASONE) 20 MG tablet Take 1 tablet (20 mg total) by mouth daily for 5 days. 02/26/22 10/05/27 Yes Campbell Stall P, DO  amLODipine-valsartan (EXFORGE) 5-160 MG tablet TAKE 1 TABLET BY MOUTH DAILY 08/12/21   Adrian Prows, MD  Calcium Carbonate-Vitamin D (CALCIUM 600 + D PO) Take 1 tablet by mouth every other day.     [provider]  celecoxib (CELEBREX) 200 MG capsule Take 200 mg by mouth as needed for mild pain. Every other day.    [provider]  Cholecalciferol (VITAMIN D3) 5000 UNITS TABS Take 1 tablet by mouth every Monday, Wednesday, and Friday.    [provider]   clopidogrel (PLAVIX) 75 MG tablet TAKE 1 TABLET BY MOUTH EVERY DAY 12/31/21   Adrian Prows, MD  Coenzyme Q10 (COQ10 PO) Take 200 mg by mouth.    [provider]  colestipol (COLESTID) 1 g tablet Take 1 g by mouth daily. Take 2 pills daily    [provider]  ezetimibe (ZETIA) 10 MG tablet Take 10 mg by mouth daily.    [provider]  hydrochlorothiazide (MICROZIDE) 12.5 MG capsule TAKE 1 CAPSULE(12.5 MG) BY MOUTH DAILY 08/12/21   Adrian Prows, MD  loratadine (CLARITIN) 10 MG tablet Take 10 mg by mouth daily as needed for allergies.     [provider]  metoprolol tartrate (LOPRESSOR) 25 MG tablet TAKE 1 TABLET BY MOUTH TWICE A DAY 12/31/21   Adrian Prows, MD  Multiple Vitamin (MULTIVITAMIN WITH MINERALS) TABS Take 1 tablet by mouth daily.    [provider]  pravastatin (PRAVACHOL) 40 MG tablet Take 1 tablet (40 mg total) by mouth at bedtime. 02/09/18   Rai, Vernelle Emerald, MD  risedronate (ACTONEL) 35 MG tablet Take 35 mg by mouth every 7 (seven) days. with water on empty stomach, nothing by mouth or lie down for next 30 minutes.    [provider]      Allergies    Bacitracin-polymyxin b, Fosamax [alendronate sodium], Clindamycin/lincomycin, Neosporin [neomycin-polymyxin-gramicidin], and Penicillins    Review of Systems   Review of Systems  Constitutional:  Negative for chills and fever.  HENT:  Positive for congestion, sinus pressure and sore throat. Negative for ear pain.   Eyes:  Negative for pain and visual disturbance.  Respiratory:  Positive for cough and shortness of breath.   Cardiovascular:  Negative for chest pain and palpitations.  Gastrointestinal:  Negative for abdominal pain and vomiting.  Genitourinary:  Negative for dysuria and hematuria.  Musculoskeletal:  Negative for arthralgias and back pain.  Skin:  Negative for color change and rash.  Neurological:  Negative for seizures and syncope.  All other systems reviewed and are  negative.   Physical Exam Updated Vital Signs BP (!) 134/48   Pulse 79   Temp (!) 97.4 F (36.3 C) (Tympanic)   Resp 17   Wt 42.2 kg   SpO2 93%   BMI 16.47 kg/m  Physical Exam Vitals and nursing note reviewed.  Constitutional:      General: She is not in acute distress.    Appearance: She is well-developed.  HENT:     Head: Normocephalic and atraumatic.  Eyes:     Conjunctiva/sclera: Conjunctivae normal.  Cardiovascular:     Rate and Rhythm: Normal rate and regular rhythm.     Heart sounds: No murmur heard. Pulmonary:     Effort: Pulmonary effort is normal. No respiratory distress.     Breath sounds: Normal breath sounds.     Comments: Clear breath sounds bilaterally. Coughing induced by deep breathing.  Abdominal:     Palpations: Abdomen is soft.     Tenderness: There is no abdominal tenderness.  Musculoskeletal:        General: No swelling.     Cervical back: Neck supple.  Skin:    General: Skin is warm and dry.     Capillary Refill: Capillary refill takes less than 2 seconds.  Neurological:     Mental Status: She is alert.  Psychiatric:        Mood and Affect: Mood normal.     ED Results / Procedures / Treatments   Labs (all labs ordered are listed, but only abnormal results are displayed) Labs Reviewed  RESP PANEL BY RT-PCR (RSV, FLU A&B, COVID)  [id number]    EKG EKG Interpretation  Date/Time:  Wednesday February 26 2022 07:26:30 EST Ventricular Rate:  87 PR Interval:  178 QRS Duration: 70 QT Interval:  371 QTC Calculation: 447 R Axis:   -80 Text Interpretation: Sinus rhythm Ventricular premature complex Left anterior fascicular block Anteroseptal ST depression as seen in previous ecg on Feb 08, 2018 Confirmed by Campbell Stall ([id number]) on 2/40/9735 7:41:29 AM  Radiology DG Chest 2 View  Result Date: 02/26/2022 CLINICAL DATA:  Short of breath, 5 day history of coughing EXAM: CHEST - 2 VIEW COMPARISON:  Prior chest x-ray 02/09/2018 FINDINGS: Cardiac and  mediastinal contours are within normal limits. Atherosclerotic calcifications again noted in the transverse aorta. Hyperinflation with chronic bronchitic changes and interstitial prominence is similar compared to prior. No focal airspace opacity to suggest bacterial pneumonia. No pleural effusion or pneumothorax. No acute osseous abnormality. IMPRESSION: Stable chest x-ray without evidence of acute cardiopulmonary process. Significant hyperinflation, bronchitic changes and interstitial coarsening suggests underlying COPD. Electronically Signed   By: Jacqulynn Cadet M.D.   On: 02/26/2022 07:52    Procedures Procedures    Medications Ordered in ED Medications  albuterol (VENTOLIN HFA) 108 (90 Base) MCG/ACT inhaler 2 puff (2 puffs Inhalation Given 02/26/22 0822)  ipratropium-albuterol (DUONEB) 0.5-2.5 (3) MG/3ML nebulizer solution 3 mL (  3 mLs Nebulization Given 02/26/22 1610)  methylPREDNISolone sodium succinate (SOLU-MEDROL) 125 mg/2 mL injection 125 mg (125 mg Intravenous Given 02/26/22 0750)  guaiFENesin-dextromethorphan (ROBITUSSIN DM) 100-10 MG/5ML syrup 10 mL (10 mLs Oral Given 02/26/22 0751)    ED Course/ Medical Decision Making/ A&P                           Medical Decision Making Amount and/or Complexity of Data Reviewed Radiology: ordered.  Risk OTC drugs. Prescription drug management.   9:55 AM 88 yo female presenting for nasal congestion, sinus pressure, and productive cough with thick green sputum x 5 days. Pt is Aox3, no respiratory distress, afebrile, with stable vitals. No hypoxia. Equal bilateral breath sounds with coughing upon deep breathing and productive sputum. Pt is nontoxic appearing with no signs or symptoms of sepsis. Differential dx includes but is not limited to viral URI, bacterial/viral pneumonia, copd exacerbation, etc.    ECG demonstrates sinus rhythm with a rate of 87 bpm. No ST segment elevation. Minor ST depression seen in anteroseptal leads as seen on  previous ecg on Dec 23th, 2019. No reports of chest pain. ACS considered but unlikely.    Duonebs and solumedrol given for concerns for copd exacerbation secondary to URI. Dextromethorphan and guaifenesin given for cough. CXR demonstrates no focal pneumonias. Evidence of hyper lung expansion concerning for copd. PCR results for covid/influenza/rsv are negative.   On re-evaluation patient has improvement of sob and coughing.  Will treat for copd exacerbation likely secondary to viral uri. Albuterol inhaler, prednisone, doxycycline, and cough suppressants. Patient in no distress and overall condition improved here in the ED. Detailed discussions were had with the patient regarding current findings, and need for close f/u with PCP or on call doctor. The patient has been instructed to return immediately if the symptoms worsen in any way for re-evaluation. Patient verbalized understanding and is in agreement with current care plan. All questions answered prior to discharge.         Final Clinical Impression(s) / ED Diagnoses Final diagnoses:  COPD exacerbation ([place])  Upper respiratory tract infection, unspecified type    Rx / DC Orders ED Discharge Orders          Ordered    predniSONE (DELTASONE) 20 MG tablet  Daily        02/26/22 0952    doxycycline (VIBRAMYCIN) 100 MG capsule  2 times daily        02/26/22 0952    guaiFENesin-dextromethorphan (ROBITUSSIN DM) 100-10 MG/5ML syrup  Every 6 hours PRN        02/26/22 0952              Lianne Cure, DO [birth year] [phone]

## 2022-02-26 NOTE — Discharge Instructions (Signed)
Rest, increase hydration, increase vitamin C intake.   Steroids, cough syrup, and antibiotics sent to your pharmacy for upper respiratory infection likely precipitating a COPD (chronic obstructive pulmonary disease) exacerbation. Educational handouts on both of these are provided in the back of your paperwork.  Please follow up with your primary care provider in the next 5-7 days if symtpoms do not improve.

## 2022-04-22 ENCOUNTER — Other Ambulatory Visit: Payer: Self-pay | Admitting: Family Medicine

## 2022-04-22 DIAGNOSIS — Z1231 Encounter for screening mammogram for malignant neoplasm of breast: Secondary | ICD-10-CM

## 2022-06-05 ENCOUNTER — Ambulatory Visit
Admission: RE | Admit: 2022-06-05 | Discharge: 2022-06-05 | Disposition: A | Discharge status: Home / Self Care | Payer: Medicare PPO | Source: Ambulatory Visit | Attending: Family Medicine | Admitting: Family Medicine

## 2022-06-05 DIAGNOSIS — Z1231 Encounter for screening mammogram for malignant neoplasm of breast: Secondary | ICD-10-CM

## 2022-06-06 ENCOUNTER — Other Ambulatory Visit: Payer: Self-pay | Admitting: Family Medicine

## 2022-06-06 DIAGNOSIS — N6310 Unspecified lump in the right breast, unspecified quadrant: Secondary | ICD-10-CM

## 2022-06-18 DIAGNOSIS — C50919 Malignant neoplasm of unspecified site of unspecified female breast: Secondary | ICD-10-CM

## 2022-06-18 HISTORY — DX: Malignant neoplasm of unspecified site of unspecified female breast: C50.919

## 2022-06-19 ENCOUNTER — Ambulatory Visit
Admission: RE | Admit: 2022-06-19 | Discharge: 2022-06-19 | Disposition: A | Discharge status: Home / Self Care | Payer: Medicare PPO | Source: Ambulatory Visit | Attending: Family Medicine | Admitting: Family Medicine

## 2022-06-19 ENCOUNTER — Other Ambulatory Visit: Payer: Self-pay | Admitting: Family Medicine

## 2022-06-19 DIAGNOSIS — N6310 Unspecified lump in the right breast, unspecified quadrant: Secondary | ICD-10-CM

## 2022-06-19 DIAGNOSIS — R921 Mammographic calcification found on diagnostic imaging of breast: Secondary | ICD-10-CM | POA: Diagnosis not present

## 2022-06-19 DIAGNOSIS — N63 Unspecified lump in unspecified breast: Secondary | ICD-10-CM | POA: Diagnosis not present

## 2022-06-19 DIAGNOSIS — Z1231 Encounter for screening mammogram for malignant neoplasm of breast: Secondary | ICD-10-CM | POA: Diagnosis not present

## 2022-07-01 ENCOUNTER — Ambulatory Visit
Admission: RE | Admit: 2022-07-01 | Discharge: 2022-07-01 | Disposition: A | Discharge status: Home / Self Care | Payer: Medicare PPO | Source: Ambulatory Visit | Attending: Family Medicine | Admitting: Family Medicine

## 2022-07-01 ENCOUNTER — Other Ambulatory Visit: Payer: Self-pay | Admitting: Family Medicine

## 2022-07-01 DIAGNOSIS — R921 Mammographic calcification found on diagnostic imaging of breast: Secondary | ICD-10-CM

## 2022-07-01 DIAGNOSIS — N6310 Unspecified lump in the right breast, unspecified quadrant: Secondary | ICD-10-CM

## 2022-07-01 DIAGNOSIS — Z170 Estrogen receptor positive status [ER+]: Secondary | ICD-10-CM | POA: Diagnosis not present

## 2022-07-01 DIAGNOSIS — C50411 Malignant neoplasm of upper-outer quadrant of right female breast: Secondary | ICD-10-CM | POA: Diagnosis not present

## 2022-07-01 DIAGNOSIS — N6012 Diffuse cystic mastopathy of left breast: Secondary | ICD-10-CM | POA: Diagnosis not present

## 2022-07-01 HISTORY — PX: BREAST BIOPSY: SHX20

## 2022-07-03 ENCOUNTER — Telehealth: Payer: Self-pay | Admitting: Hematology and Oncology

## 2022-07-03 NOTE — Telephone Encounter (Signed)
Spoke to patient to confirm upcoming afternoon [facility] clinic appointment on 5/29, paperwork will be sent via mail.   Gave location and time, also informed patient that the surgeon's office would be calling as well to get information from them similar to the packet that they will be receiving so make sure to do both.  Reminded patient that all providers will be coming to the clinic to see them HERE and if they had any questions to not hesitate to reach back out to myself or their navigators.

## 2022-07-15 ENCOUNTER — Encounter: Payer: Self-pay | Admitting: *Deleted

## 2022-07-15 DIAGNOSIS — Z170 Estrogen receptor positive status [ER+]: Secondary | ICD-10-CM | POA: Insufficient documentation

## 2022-07-15 NOTE — Progress Notes (Addendum)
Radiation Oncology         [phone] ________________________________  Name: Madison Richards        MRN: [id number]  Date of Service: 07/16/2022 DOB: January 25, 1935  WU:[id number], Madison Stanley, MD  [name], MD     REFERRING PHYSICIAN: Chevis Pretty III, MD   DIAGNOSIS: There were no encounter diagnoses.   HISTORY OF PRESENT ILLNESS: Madison Richards is a 88 y.o. female seen in the multidisciplinary breast clinic for a new diagnosis of right breast cancer. The patient was noted to have of new hardening and redness of the right breast. Of note, she does have a prior history of breast implants which have been removed. Diagnostic mammogram and ultrasound on 06/19/22 showed suspicious skin thickening and redness of the periareolar right breast extending to the nipple, with a probable corresponding area of abnormal hypoechoic tissue in the adjacent breast tissue along with an indeterminate mass behind the right nipple measuring 0.9 cm in size. An indeterminate calcification in the central left breast spanning 2.6 cm was also seen.  Biopsy on 07/01/22 of the right breast 10 o'clock position 2 cmfn showed grade II invasive lobular carcinoma. Rare calcifications were seen, but lymphovascular invasion was not identified. Prognostics showed estrogen 95% positive with strong staining intensity, progesterone 20% positive with strong staining intensity, HER2 negative. Ki67 was 10%. Biopsy of the left breast lesion was negative for malignancy.   She is seen today to discuss treatment recommendations of her cancer.      PREVIOUS RADIATION THERAPY: {EXAM; YES/NO:19492::"No"}   PAST MEDICAL HISTORY:  Past Medical History:  Diagnosis Date   Generalized headaches    due to neck pain   GERD (gastroesophageal reflux disease)    Hyperlipidemia    Hypertension    Neck pain    Osteoporosis    Stroke [facility])        PAST SURGICAL HISTORY: Past Surgical History:  Procedure Laterality Date   ABDOMINAL ANGIOGRAM  Bilateral 03/02/2012   Procedure: ABDOMINAL ANGIOGRAM;  Surgeon: Pamella Pert, MD;  Location: [facility] CATH LAB;  Service: Cardiovascular;  Laterality: Bilateral;   ABDOMINAL ANGIOGRAM  04/06/2012   Procedure: ABDOMINAL ANGIOGRAM;  Surgeon: Pamella Pert, MD;  Location: [facility] CATH LAB;  Service: Cardiovascular;;   abdominal tumor     removed in 1968/benign   APPENDECTOMY     AUGMENTATION MAMMAPLASTY  2000   REMOVED in 2000   BREAST BIOPSY Right 07/01/2022   [place] RT BREAST BX W LOC DEV 1ST LESION IMG BX SPEC US GUIDE 07/01/2022 [facility] MAMMOGRAP[facility]   BREAST BIOPSY Left 07/01/2022   [place] LT BREAST BX W LOC DEV 1ST LESION IMG BX SPEC US GUIDE 07/01/2022 [facility] MAMMOGR[facility]   ILIAC ARTERY STENT     right side 2003 and 2014     FAMILY HISTORY:  Family History  Problem Relation Age of Onset   Heart disease Sister    Heart disease Brother    Heart disease Brother    Colon cancer Neg Hx    Breast cancer Neg Hx      SOCIAL HISTORY:  reports that she quit smoking about 4 years ago. Her smoking use included cigarettes. She has never used smokeless tobacco. She reports current alcohol use of about 1.0 standard drink of alcohol per week. She reports that she does not use drugs.   ALLERGIES: Bacitracin-polymyxin b, Fosamax [alendronate sodium], Clindamycin/lincomycin, Neosporin [neomycin-polymyxin-gramicidin], and Penicillins   MEDICATIONS:  Current Outpatient Medications  Medication Sig Dispense Refill  amLODipine-valsartan (EXFORGE) 5-160 MG tablet TAKE 1 TABLET BY MOUTH DAILY 90 tablet 3   Calcium Carbonate-Vitamin D (CALCIUM 600 + D PO) Take 1 tablet by mouth every other day.      celecoxib (CELEBREX) 200 MG capsule Take 200 mg by mouth as needed for mild pain. Every other day.     Cholecalciferol (VITAMIN D3) 5000 UNITS TABS Take 1 tablet by mouth every Monday, Wednesday, and Friday.     clopidogrel (PLAVIX) 75 MG tablet TAKE 1 TABLET BY MOUTH EVERY DAY 90 tablet 1   Coenzyme Q10 (COQ10 PO)  Take 200 mg by mouth.     colestipol (COLESTID) 1 g tablet Take 1 g by mouth daily. Take 2 pills daily     doxycycline (VIBRAMYCIN) 100 MG capsule Take 1 capsule (100 mg total) by mouth 2 (two) times daily. 20 capsule 0   ezetimibe (ZETIA) 10 MG tablet Take 10 mg by mouth daily.     guaiFENesin-dextromethorphan (ROBITUSSIN DM) 100-10 MG/5ML syrup Take 10 mLs by mouth every 6 (six) hours as needed for cough. 118 mL 0   hydrochlorothiazide (MICROZIDE) 12.5 MG capsule TAKE 1 CAPSULE(12.5 MG) BY MOUTH DAILY 90 capsule 3   loratadine (CLARITIN) 10 MG tablet Take 10 mg by mouth daily as needed for allergies.      metoprolol tartrate (LOPRESSOR) 25 MG tablet TAKE 1 TABLET BY MOUTH TWICE A DAY 180 tablet 1   Multiple Vitamin (MULTIVITAMIN WITH MINERALS) TABS Take 1 tablet by mouth daily.     pravastatin (PRAVACHOL) 40 MG tablet Take 1 tablet (40 mg total) by mouth at bedtime. 30 tablet 3   risedronate (ACTONEL) 35 MG tablet Take 35 mg by mouth every 7 (seven) days. with water on empty stomach, nothing by mouth or lie down for next 30 minutes.     No current facility-administered medications for this visit.     REVIEW OF SYSTEMS: On review of systems, the patient reports that she is doing well overall. No breast specific complaints are verbalized.  ***      PHYSICAL EXAM:  Wt Readings from Last 3 Encounters:  02/26/22 93 lb (42.2 kg)  01/24/22 95 lb 3.2 oz (43.2 kg)  01/24/21 102 lb 12.8 oz (46.6 kg)   Temp Readings from Last 3 Encounters:  02/26/22 (!) 97.4 F (36.3 C) (Tympanic)  01/24/21 97.8 F (36.6 C) (Temporal)  11/04/18 (!) 97.3 F (36.3 C) (Oral)   BP Readings from Last 3 Encounters:  02/26/22 (!) 134/48  01/24/22 (!) 130/58  01/24/21 (!) 116/44   Pulse Readings from Last 3 Encounters:  02/26/22 81  01/24/22 (!) 51  01/24/21 67    In general this is a well appearing *** female in no acute distress. She's alert and oriented x4 and appropriate throughout the examination.  Cardiopulmonary assessment is negative for acute distress and she exhibits normal effort. Bilateral breast exam is deferred.    ECOG = ***  0 - Asymptomatic (Fully active, able to carry on all predisease activities without restriction)  1 - Symptomatic but completely ambulatory (Restricted in physically strenuous activity but ambulatory and able to carry out work of a light or sedentary nature. For example, light housework, office work)  2 - Symptomatic, <50% in bed during the day (Ambulatory and capable of all self care but unable to carry out any work activities. Up and about more than 50% of waking hours)  3 - Symptomatic, >50% in bed, but not bedbound (Capable of only limited  self-care, confined to bed or chair 50% or more of waking hours)  4 - Bedbound (Completely disabled. Cannot carry on any self-care. Totally confined to bed or chair)  5 - Death   Santiago Glad MM, Creech RH, Tormey DC, et al. [id number]). "Toxicity and response criteria of the [facility]". Am. Evlyn Clines. Oncol. 5 (6): 649-55    LABORATORY DATA:  Lab Results  Component Value Date   WBC 9.5 02/08/2018   HGB 9.5 (L) 02/08/2018   HCT 31.8 (L) 02/08/2018   MCV 77.2 (L) 02/08/2018   PLT 422 (H) 02/08/2018   Lab Results  Component Value Date   NA 137 02/08/2018   K 3.6 02/08/2018   CL 107 02/08/2018   CO2 21 (L) 02/08/2018   Lab Results  Component Value Date   ALT 12 02/08/2018   AST 24 02/08/2018   ALKPHOS 57 02/08/2018   BILITOT 0.5 02/08/2018      RADIOGRAPHY: [place] LT BREAST BX W LOC DEV 1ST LESION IMG BX SPEC US GUIDE  Addendum Date: 07/08/2022   ADDENDUM REPORT: 07/08/2022 16:05 ADDENDUM: PATHOLOGY revealed: Site 1. Breast, RIGHT, needle core biopsy, 10 o'clock, 2 cm fn, venus clip - INVASIVE LOBULAR CARCINOMA, SEE NOTE TUBULE FORMATION: SCORE 3 - NUCLEAR PLEOMORPHISM: SCORE 2 MITOTIC COUNT: SCORE 1 - TOTAL SCORE: 6 - OVERALL GRADE: 2 - LYMPHOVASCULAR INVASION: NOT IDENTIFIED - CANCER  LENGTH: 1.4 CM - CALCIFICATIONS: PRESENT, RARE Pathology results are CONCORDANT with imaging findings, per Dr. Emmaline Kluver. PATHOLOGY revealed: Site 2. Breast, LEFT, needle core biopsy, 9 o'clock, 1 cm fn, coil clip - USUAL DUCTAL HYPERPLASIA (UDH), SCLEROSING ADENOSIS AND ASSOCIATED MICROCALCIFICATIONS - FIBROCYSTIC CHANGES INCLUDING APOCRINE METAPLASIA - NEGATIVE FOR MALIGNANCY Pathology results are DISCORDANT with imaging findings, per Dr. Emmaline Kluver, with excision recommended. Recommend seed localization of the left breast with excision. Please note the biopsy clip is located superior to the dominant portion of calcifications that should be excised. Pathology results and recommendations below were discussed with patient by telephone on 07/03/2022. Patient reported biopsy site within normal limits with slight tenderness at the site. Post biopsy care instructions were reviewed, questions were answered and my direct phone number was provided to patient. Patient was instructed to call [facility] if any concerns or questions arise related to the biopsy. The patient was referred to the [facility] at [facility] with appointment on 07/09/2022. Pathology results reported by Lynett Grimes, RN on 07/03/2022. Electronically Signed   By: Emmaline Kluver M.D.   On: 07/08/2022 16:05   Result Date: 07/08/2022 CLINICAL DATA:  88 year old female presenting for biopsy of calcifications in the left breast. EXAM: ULTRASOUND LEFT BREAST LIMITED ULTRASOUND GUIDED LEFT BREAST CORE NEEDLE BIOPSY COMPARISON:  Previous exam(s). PROCEDURE: Patient was initially set up for stereotactic biopsy of the left breast but on pre-procedure it became clear the calcifications would not be accessible for biopsy. Therefore patient was taken to the ultrasound room in attempt to identify the calcifications sonographically. Targeted ultrasound of the left breast  was performed demonstrating echogenic foci at 9 o'clock 1 cm from the nipple felt to represent calcifications identified mammographically in the medial breast. I met with the patient and we discussed the procedure of ultrasound-guided biopsy, including benefits and alternatives. We discussed the high likelihood of a successful procedure. We discussed the risks of the procedure, including infection, bleeding, tissue injury, clip migration, and inadequate sampling. Informed written consent was given. The  usual time-out protocol was performed immediately prior to the procedure. Lesion quadrant: Lower inner quadrant Using sterile technique and 1% Lidocaine as local anesthetic, under direct ultrasound visualization, a 14 gauge spring-loaded device was used to perform biopsy of calcifications in the left breast at 9 o'clock using a lateral approach. At the conclusion of the procedure a coil shaped tissue marker clip was deployed into the biopsy cavity. Follow up 2 view mammogram was performed and dictated separately. IMPRESSION: Ultrasound guided biopsy of calcifications in the left breast at 9 o'clock. No apparent complications. Electronically Signed: By: Emmaline Kluver M.D. On: 07/01/2022 09:39  [place] LIMITED ULTRASOUND INCLUDING AXILLA LEFT BREAST   Addendum Date: 07/08/2022   ADDENDUM REPORT: 07/08/2022 16:05 ADDENDUM: PATHOLOGY revealed: Site 1. Breast, RIGHT, needle core biopsy, 10 o'clock, 2 cm fn, venus clip - INVASIVE LOBULAR CARCINOMA, SEE NOTE TUBULE FORMATION: SCORE 3 - NUCLEAR PLEOMORPHISM: SCORE 2 MITOTIC COUNT: SCORE 1 - TOTAL SCORE: 6 - OVERALL GRADE: 2 - LYMPHOVASCULAR INVASION: NOT IDENTIFIED - CANCER LENGTH: 1.4 CM - CALCIFICATIONS: PRESENT, RARE Pathology results are CONCORDANT with imaging findings, per Dr. Emmaline Kluver. PATHOLOGY revealed: Site 2. Breast, LEFT, needle core biopsy, 9 o'clock, 1 cm fn, coil clip - USUAL DUCTAL HYPERPLASIA (UDH), SCLEROSING ADENOSIS AND ASSOCIATED  MICROCALCIFICATIONS - FIBROCYSTIC CHANGES INCLUDING APOCRINE METAPLASIA - NEGATIVE FOR MALIGNANCY Pathology results are DISCORDANT with imaging findings, per Dr. Emmaline Kluver, with excision recommended. Recommend seed localization of the left breast with excision. Please note the biopsy clip is located superior to the dominant portion of calcifications that should be excised. Pathology results and recommendations below were discussed with patient by telephone on 07/03/2022. Patient reported biopsy site within normal limits with slight tenderness at the site. Post biopsy care instructions were reviewed, questions were answered and my direct phone number was provided to patient. Patient was instructed to call [facility] if any concerns or questions arise related to the biopsy. The patient was referred to the [facility] at [facility] with appointment on 07/09/2022. Pathology results reported by Lynett Grimes, RN on 07/03/2022. Electronically Signed   By: Emmaline Kluver M.D.   On: 07/08/2022 16:05   Result Date: 07/08/2022 CLINICAL DATA:  88 year old female presenting for biopsy of calcifications in the left breast. EXAM: ULTRASOUND LEFT BREAST LIMITED ULTRASOUND GUIDED LEFT BREAST CORE NEEDLE BIOPSY COMPARISON:  Previous exam(s). PROCEDURE: Patient was initially set up for stereotactic biopsy of the left breast but on pre-procedure it became clear the calcifications would not be accessible for biopsy. Therefore patient was taken to the ultrasound room in attempt to identify the calcifications sonographically. Targeted ultrasound of the left breast was performed demonstrating echogenic foci at 9 o'clock 1 cm from the nipple felt to represent calcifications identified mammographically in the medial breast. I met with the patient and we discussed the procedure of ultrasound-guided biopsy, including benefits and alternatives. We  discussed the high likelihood of a successful procedure. We discussed the risks of the procedure, including infection, bleeding, tissue injury, clip migration, and inadequate sampling. Informed written consent was given. The usual time-out protocol was performed immediately prior to the procedure. Lesion quadrant: Lower inner quadrant Using sterile technique and 1% Lidocaine as local anesthetic, under direct ultrasound visualization, a 14 gauge spring-loaded device was used to perform biopsy of calcifications in the left breast at 9 o'clock using a lateral approach. At the conclusion of the procedure a coil shaped tissue marker clip was deployed  into the biopsy cavity. Follow up 2 view mammogram was performed and dictated separately. IMPRESSION: Ultrasound guided biopsy of calcifications in the left breast at 9 o'clock. No apparent complications. Electronically Signed: By: Emmaline Kluver M.D. On: 07/01/2022 09:39   [place] RT BREAST BX W LOC DEV 1ST LESION IMG BX SPEC US GUIDE  Addendum Date: 07/03/2022   ADDENDUM REPORT: 07/03/2022 15:48 ADDENDUM: PATHOLOGY revealed: Site 1. Breast, RIGHT, needle core biopsy, 10 o'clock, 2 cm fn, venus clip - INVASIVE LOBULAR CARCINOMA, SEE NOTE TUBULE FORMATION: SCORE 3 - NUCLEAR PLEOMORPHISM: SCORE 2 MITOTIC COUNT: SCORE 1 - TOTAL SCORE: 6 - OVERALL GRADE: 2 - LYMPHOVASCULAR INVASION: NOT IDENTIFIED - CANCER LENGTH: 1.4 CM - CALCIFICATIONS: PRESENT, RARE Pathology results are CONCORDANT with imaging findings, per Dr. Emmaline Kluver. PATHOLOGY revealed: Site 2. Breast, LEFT, needle core biopsy, 9 o'clock, 1 cm fn, coil clip - USUAL DUCTAL HYPERPLASIA (UDH), SCLEROSING ADENOSIS AND ASSOCIATED MICROCALCIFICATIONS - FIBROCYSTIC CHANGES INCLUDING APOCRINE METAPLASIA - NEGATIVE FOR MALIGNANCY Pathology results are DISCORDANT with imaging findings, per Dr. Emmaline Kluver, with excision recommended. Recommend seed localization of the left breast with excision. Please note the  biopsy clip is located superior to the dominant portion of calcifications that should be excised. Pathology results and recommendations below were discussed with patient by telephone on 07/03/2022. Patient reported biopsy site within normal limits with slight tenderness at the site. Post biopsy care instructions were reviewed, questions were answered and my direct phone number was provided to patient. Patient was instructed to call [facility] if any concerns or questions arise related to the biopsy. The patient was referred to the [facility] at [facility] with appointment on 07/09/2022. Pathology results reported by Lynett Grimes, RN on 07/03/2022. Electronically Signed   By: Emmaline Kluver M.D.   On: 07/03/2022 15:48   Result Date: 07/03/2022 CLINICAL DATA:  88 year old female presenting for biopsy of a hypoechoic area in the right breast. EXAM: ULTRASOUND GUIDED RIGHT BREAST CORE NEEDLE BIOPSY COMPARISON:  Previous exam(s). PROCEDURE: I met with the patient and we discussed the procedure of ultrasound-guided biopsy, including benefits and alternatives. We discussed the high likelihood of a successful procedure. We discussed the risks of the procedure, including infection, bleeding, tissue injury, clip migration, and inadequate sampling. Informed written consent was given. The usual time-out protocol was performed immediately prior to the procedure. Lesion quadrant: Upper outer quadrant Using sterile technique and 1% Lidocaine as local anesthetic, under direct ultrasound visualization, a 14 gauge spring-loaded device was used to perform biopsy of a hypoechoic area in the right breast 10 o'clock using a lateral approach. At the conclusion of the procedure a Venus shaped tissue marker clip was deployed into the biopsy cavity. Follow up 2 view mammogram was performed and dictated separately. IMPRESSION: Ultrasound guided biopsy of a  hypoechoic area in the right breast at 10 o'clock. No apparent complications. Electronically Signed: By: Emmaline Kluver M.D. On: 07/01/2022 09:31  MM CLIP PLACEMENT RIGHT  Result Date: 07/01/2022 CLINICAL DATA:  Post procedure mammogram for clip placement EXAM: 3D DIAGNOSTIC BILATERAL MAMMOGRAM POST ULTRASOUND BIOPSY COMPARISON:  Previous exam(s). FINDINGS: 3D Mammographic images were obtained following ultrasound guided biopsy of a hypoechoic area in the right breast at 10 o'clock. The Venus biopsy marking clip is in expected position at the site of biopsy. 3D Mammographic images were obtained following ultrasound guided biopsy of calcifications in the left breast at 9 o'clock. The coil biopsy marking  clip is in expected position at the site of biopsy. IMPRESSION: Appropriate positioning of the Venus shaped biopsy marking clip at the site of biopsy in the right breast at 10 o'clock. Appropriate positioning of the coil shaped biopsy marking clip at the site of biopsy in the left breast at 9 o'clock. Final Assessment: Post Procedure Mammograms for Marker Placement Electronically Signed   By: Emmaline Kluver M.D.   On: 07/01/2022 09:31  MM CLIP PLACEMENT LEFT  Result Date: 07/01/2022 CLINICAL DATA:  Post procedure mammogram for clip placement EXAM: 3D DIAGNOSTIC BILATERAL MAMMOGRAM POST ULTRASOUND BIOPSY COMPARISON:  Previous exam(s). FINDINGS: 3D Mammographic images were obtained following ultrasound guided biopsy of a hypoechoic area in the right breast at 10 o'clock. The Venus biopsy marking clip is in expected position at the site of biopsy. 3D Mammographic images were obtained following ultrasound guided biopsy of calcifications in the left breast at 9 o'clock. The coil biopsy marking clip is in expected position at the site of biopsy. IMPRESSION: Appropriate positioning of the Venus shaped biopsy marking clip at the site of biopsy in the right breast at 10 o'clock. Appropriate positioning of the  coil shaped biopsy marking clip at the site of biopsy in the left breast at 9 o'clock. Final Assessment: Post Procedure Mammograms for Marker Placement Electronically Signed   By: Emmaline Kluver M.D.   On: 07/01/2022 09:31  MM 3D DIAGNOSTIC MAMMOGRAM BILATERAL BREAST  Result Date: 06/19/2022 CLINICAL DATA:  88 year old female presenting for annual exam and evaluation of new hardening and redness of the right breast. Patient has a prior history of breast implants which have been removed. EXAM: DIGITAL DIAGNOSTIC BILATERAL MAMMOGRAM WITH TOMOSYNTHESIS; ULTRASOUND RIGHT BREAST LIMITED TECHNIQUE: Bilateral digital diagnostic mammography and breast tomosynthesis was performed.; Targeted ultrasound examination of the right breast was performed COMPARISON:  Previous exam(s). ACR Breast Density Category b: There are scattered areas of fibroglandular density. FINDINGS: Mammogram: Assessment is limited by patient clinical condition and small amount of breast tissue. Right breast: A skin BB marks the palpable site of concern reported by the patient in the outer periareolar right breast. A spot tangential view of this area was attempted. There is no definite abnormality at the palpable site or elsewhere in the right breast. Left breast: There are pleomorphic calcifications in the central left breast spanning approximately 2.6 cm. Magnification views were attempted but the calcifications could only be partially seen. There are no additional findings elsewhere in the left breast. On physical exam there is raised red thickening of the periareolar right breast with redness and flattening of the right nipple. Ultrasound: Targeted ultrasound performed at the palpable site of concern demonstrating skin thickening with subtle irregular hypoechoic area in the right breast at 10 o'clock measuring approximately 1.7 cm. Skin thickening extends to the nipple. Additionally there is a mass behind the nipple measuring approximately 0.8 x  0.7 x 0.9 cm. Limited targeted ultrasound of the right axilla demonstrates normal lymph nodes. IMPRESSION: 1. Suspicious skin thickening and redness of the periareolar right breast extending to the nipple, with a probable corresponding area of abnormal hypoechoic tissue in the adjacent breast tissue. 2. Probable indeterminate mass behind the right nipple measuring 0.9 cm. Less likely this may represent the posterior aspect of the nipple being pulled in. 3. Indeterminate calcifications in the central left breast spanning 2.6 cm. RECOMMENDATION: Ultrasound-guided core needle biopsy x1 of the right breast. Stereotactic core needle biopsy x1 of the left breast. I have discussed the findings and recommendations  with the patient. If applicable, a reminder letter will be sent to the patient regarding the next appointment. BI-RADS CATEGORY  4: Suspicious. Electronically Signed   By: Emmaline Kluver M.D.   On: 06/19/2022 13:24  [place] LIMITED ULTRASOUND INCLUDING AXILLA RIGHT BREAST  Result Date: 06/19/2022 CLINICAL DATA:  88 year old female presenting for annual exam and evaluation of new hardening and redness of the right breast. Patient has a prior history of breast implants which have been removed. EXAM: DIGITAL DIAGNOSTIC BILATERAL MAMMOGRAM WITH TOMOSYNTHESIS; ULTRASOUND RIGHT BREAST LIMITED TECHNIQUE: Bilateral digital diagnostic mammography and breast tomosynthesis was performed.; Targeted ultrasound examination of the right breast was performed COMPARISON:  Previous exam(s). ACR Breast Density Category b: There are scattered areas of fibroglandular density. FINDINGS: Mammogram: Assessment is limited by patient clinical condition and small amount of breast tissue. Right breast: A skin BB marks the palpable site of concern reported by the patient in the outer periareolar right breast. A spot tangential view of this area was attempted. There is no definite abnormality at the palpable site or elsewhere in the right  breast. Left breast: There are pleomorphic calcifications in the central left breast spanning approximately 2.6 cm. Magnification views were attempted but the calcifications could only be partially seen. There are no additional findings elsewhere in the left breast. On physical exam there is raised red thickening of the periareolar right breast with redness and flattening of the right nipple. Ultrasound: Targeted ultrasound performed at the palpable site of concern demonstrating skin thickening with subtle irregular hypoechoic area in the right breast at 10 o'clock measuring approximately 1.7 cm. Skin thickening extends to the nipple. Additionally there is a mass behind the nipple measuring approximately 0.8 x 0.7 x 0.9 cm. Limited targeted ultrasound of the right axilla demonstrates normal lymph nodes. IMPRESSION: 1. Suspicious skin thickening and redness of the periareolar right breast extending to the nipple, with a probable corresponding area of abnormal hypoechoic tissue in the adjacent breast tissue. 2. Probable indeterminate mass behind the right nipple measuring 0.9 cm. Less likely this may represent the posterior aspect of the nipple being pulled in. 3. Indeterminate calcifications in the central left breast spanning 2.6 cm. RECOMMENDATION: Ultrasound-guided core needle biopsy x1 of the right breast. Stereotactic core needle biopsy x1 of the left breast. I have discussed the findings and recommendations with the patient. If applicable, a reminder letter will be sent to the patient regarding the next appointment. BI-RADS CATEGORY  4: Suspicious. Electronically Signed   By: Emmaline Kluver M.D.   On: 06/19/2022 13:24      IMPRESSION/PLAN: 1. *** Dr. Mitzi Hansen discussed the pathology findings and reviewed the nature of ***. The consensus from the breast conference includes ***. Dr. Mitzi Hansen recommends external beam radiotherapy to the breast following her ***lumpectomy to reduce risks of local recurrence  followed by antiestrogen therapy. We discussed the risks, benefits, short, and long term effects of radiotherapy, as well as the *** intent, and the patient is interested in proceeding. Dr. Mitzi Hansen discussed the delivery and logistics of radiotherapy and anticipates a course of *** weeks of radiotherapy to the *** breast with *** deep inspiration breath hold technique. We will see her back a few weeks after surgery to discuss the simulation process and anticipate starting radiotherapy about 4-6 weeks after surgery.   2. Possible genetic predisposition to malignancy. The patient is a candidate for genetic testing given *** personal and family history. She will meet with our geneticist today in clinic.   In  a visit lasting *** minutes, greater than 50% of the time was spent face to face reviewing her case, as well as in preparation of, discussing, and coordinating the patient's care.  The above documentation reflects my direct findings during this shared patient visit. Please see the separate note by Dr. Mitzi Hansen on this date for the remainder of the patient's plan of care.    Joyice Faster, [place]    **Disclaimer: This note was dictated with voice recognition software. Similar sounding words can inadvertently be transcribed and this note may contain transcription errors which may not have been corrected upon publication of note.**

## 2022-07-16 ENCOUNTER — Ambulatory Visit: Payer: Self-pay | Admitting: General Surgery

## 2022-07-16 ENCOUNTER — Encounter: Payer: Self-pay | Admitting: *Deleted

## 2022-07-16 ENCOUNTER — Inpatient Hospital Stay: Payer: Medicare PPO

## 2022-07-16 ENCOUNTER — Inpatient Hospital Stay (HOSPITAL_BASED_OUTPATIENT_CLINIC_OR_DEPARTMENT_OTHER): Payer: Medicare PPO | Admitting: Genetic Counselor

## 2022-07-16 ENCOUNTER — Encounter: Payer: Self-pay | Admitting: Emergency Medicine

## 2022-07-16 ENCOUNTER — Ambulatory Visit: Payer: Medicare PPO | Admitting: Physical Therapy

## 2022-07-16 ENCOUNTER — Encounter: Payer: Self-pay | Admitting: General Practice

## 2022-07-16 ENCOUNTER — Ambulatory Visit
Admission: RE | Admit: 2022-07-16 | Discharge: 2022-07-16 | Disposition: A | Discharge status: Home / Self Care | Payer: Medicare PPO | Source: Ambulatory Visit | Attending: Radiation Oncology | Admitting: Radiation Oncology

## 2022-07-16 ENCOUNTER — Other Ambulatory Visit: Payer: Self-pay | Admitting: *Deleted

## 2022-07-16 ENCOUNTER — Inpatient Hospital Stay: Payer: Medicare PPO | Attending: Hematology and Oncology | Admitting: Hematology and Oncology

## 2022-07-16 VITALS — BP 149/49 | HR 70 | Temp 97.5°F | Resp 18 | Ht 63.0 in | Wt 90.6 lb

## 2022-07-16 DIAGNOSIS — Z170 Estrogen receptor positive status [ER+]: Secondary | ICD-10-CM

## 2022-07-16 DIAGNOSIS — Z803 Family history of malignant neoplasm of breast: Secondary | ICD-10-CM

## 2022-07-16 DIAGNOSIS — C50411 Malignant neoplasm of upper-outer quadrant of right female breast: Secondary | ICD-10-CM

## 2022-07-16 DIAGNOSIS — Z87891 Personal history of nicotine dependence: Secondary | ICD-10-CM | POA: Insufficient documentation

## 2022-07-16 DIAGNOSIS — R921 Mammographic calcification found on diagnostic imaging of breast: Secondary | ICD-10-CM

## 2022-07-16 DIAGNOSIS — Z8041 Family history of malignant neoplasm of ovary: Secondary | ICD-10-CM | POA: Diagnosis not present

## 2022-07-16 DIAGNOSIS — Z9011 Acquired absence of right breast and nipple: Secondary | ICD-10-CM | POA: Diagnosis not present

## 2022-07-16 LAB — CMP (CANCER CENTER ONLY)
ALT: 12 U/L (ref 0–44)
AST: 25 U/L (ref 15–41)
Albumin: 4 g/dL (ref 3.5–5.0)
Alkaline Phosphatase: 74 U/L (ref 38–126)
Anion gap: 5 (ref 5–15)
BUN: 23 mg/dL (ref 8–23)
CO2: 32 mmol/L (ref 22–32)
Calcium: 10 mg/dL (ref 8.9–10.3)
Chloride: 104 mmol/L (ref 98–111)
Creatinine: 0.99 mg/dL (ref 0.44–1.00)
GFR, Estimated: 56 mL/min — ABNORMAL LOW (ref >60)
Glucose, Bld: 153 mg/dL — ABNORMAL HIGH (ref 70–99)
Potassium: 3.7 mmol/L (ref 3.5–5.1)
Sodium: 141 mmol/L (ref 135–145)
Total Bilirubin: 0.5 mg/dL (ref 0.3–1.2)
Total Protein: 6.8 g/dL (ref 6.5–8.1)

## 2022-07-16 LAB — CBC WITH DIFFERENTIAL (CANCER CENTER ONLY)
Abs Immature Granulocytes: 0.02 10*3/uL (ref 0.00–0.07)
Basophils Absolute: 0.1 10*3/uL (ref 0.0–0.1)
Basophils Relative: 1 %
Eosinophils Absolute: 0.1 10*3/uL (ref 0.0–0.5)
Eosinophils Relative: 2 %
HCT: 42.9 % (ref 36.0–46.0)
Hemoglobin: 14.4 g/dL (ref 12.0–15.0)
Immature Granulocytes: 0 %
Lymphocytes Relative: 13 %
Lymphs Abs: 0.8 10*3/uL (ref 0.7–4.0)
MCH: 34.3 pg — ABNORMAL HIGH (ref 26.0–34.0)
MCHC: 33.6 g/dL (ref 30.0–36.0)
MCV: 102.1 fL — ABNORMAL HIGH (ref 80.0–100.0)
Monocytes Absolute: 0.6 10*3/uL (ref 0.1–1.0)
Monocytes Relative: 9 %
Neutro Abs: 4.7 10*3/uL (ref 1.7–7.7)
Neutrophils Relative %: 75 %
Platelet Count: 238 10*3/uL (ref 150–400)
RBC: 4.2 MIL/uL (ref 3.87–5.11)
RDW: 14.8 % (ref 11.5–15.5)
WBC Count: 6.2 10*3/uL (ref 4.0–10.5)
nRBC: 0.5 % — ABNORMAL HIGH (ref 0.0–0.2)

## 2022-07-16 LAB — GENETIC SCREENING ORDER

## 2022-07-16 NOTE — Progress Notes (Signed)
[facility] CONSULT NOTE  Patient Care Team: Sigmund Hazel, MD as PCP - General (Family Medicine) Serena Croissant, MD as Consulting Physician (Hematology and Oncology) Dorothy Puffer, MD as Consulting Physician (Radiation Oncology) [name], MD as Consulting Physician (General Surgery) Pershing Proud, RN as Oncology Nurse Navigator Donnelly Angelica, RN as Oncology Nurse Navigator  CHIEF COMPLAINTS/PURPOSE OF CONSULTATION:  Newly diagnosed breast cancer  HISTORY OF PRESENTING ILLNESS:  Madison Richards 88 y.o. female is here because of recent diagnosis of right breast cancer.  Patient felt a hardening around the right breast and nipple for about couple of months.  There is led to her undergoing a mammogram and ultrasound that revealed a 1.7 cm mass with skin thickening extending to the nipple and a 0.9 cm contiguous with the skin.  Axilla was negative.  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  The left breast there were some calcifications at 9 o'clock position measuring 2.6 cm and biopsy was benign excision was recommended.  She was presented this morning to the multidisciplinary tumor board and she is here today to discuss her treatment plan.  I reviewed her records extensively and collaborated the history with the patient.  SUMMARY OF ONCOLOGIC HISTORY: Oncology History  Malignant neoplasm of upper-outer quadrant of right breast in female, estrogen receptor positive (HCC)  07/01/2022 Initial Diagnosis   Right breast periareolar hardening and redness at 10 o'clock position measuring 1.7 cm with skin thickening extending to the nipple and an additional area 0.9 cm contiguous with the skin axilla, negative biopsy revealed grade 2 ILC ER 95% PR 20% Ki67 10%, HER2 negative 1+ by IHC.  Left breast calcifications retroareolar 9 o'clock position 2.6 cm biopsy was discordant excision recommended.      MEDICAL HISTORY:   Past Medical History:  Diagnosis Date   Abdominal aortic aneurysm (AAA) (HCC)    Generalized headaches    due to neck pain   GERD (gastroesophageal reflux disease)    Hyperlipidemia    Hypertension    Neck pain    Osteoporosis    Stroke [facility])     SURGICAL HISTORY: Past Surgical History:  Procedure Laterality Date   ABDOMINAL ANGIOGRAM Bilateral 03/02/2012   Procedure: ABDOMINAL ANGIOGRAM;  Surgeon: Pamella Pert, MD;  Location: [facility] CATH LAB;  Service: Cardiovascular;  Laterality: Bilateral;   ABDOMINAL ANGIOGRAM  04/06/2012   Procedure: ABDOMINAL ANGIOGRAM;  Surgeon: Pamella Pert, MD;  Location: [facility] [place] CATH LAB;  Service: Cardiovascular;;   abdominal tumor     removed in 1968/benign   APPENDECTOMY     AUGMENTATION MAMMAPLASTY  2000   REMOVED in 2000   BREAST BIOPSY Right 07/01/2022   [place] RT BREAST BX W LOC DEV 1ST LESION IMG BX SPEC US GUIDE 07/01/2022 [facility]   BREAST BIOPSY Left 07/01/2022   [place] LT BREAST BX W LOC DEV 1ST LESION IMG BX SPEC US GUIDE 07/01/2022 [facility]   ILIAC ARTERY STENT     right side 2003 and 2014    SOCIAL HISTORY: Social History   Socioeconomic History   Marital status: Widowed    Spouse name: Not on file   Number of children: 2   Years of education: Not on file   Highest education level: Not on file  Occupational History   Not on file  Tobacco Use   Smoking status: Former    Types: Cigarettes  Quit date: 02/07/2018    Years since quitting: 4.4   Smokeless tobacco: Never  Vaping Use   Vaping Use: Never used  Substance and Sexual Activity   Alcohol use: Yes    Alcohol/week: 1.0 standard drink of alcohol    Types: 1 Glasses of wine per week    Comment: daily   Drug use: No   Sexual activity: Not on file  Other Topics Concern   Not on file  Social History Narrative   Not on file   Social Determinants of Health   Financial Resource Strain: Not on file  Food Insecurity: Not on file  Transportation Needs: Not  on file  Physical Activity: Not on file  Stress: Not on file  Social Connections: Not on file  Intimate Partner Violence: Not on file    FAMILY HISTORY: Family History  Problem Relation Age of Onset   Heart disease Sister    Heart disease Brother    Heart disease Brother    Colon cancer Neg Hx    Breast cancer Neg Hx     ALLERGIES:  is allergic to bacitracin-polymyxin b, fosamax [alendronate sodium], clindamycin/lincomycin, neosporin [neomycin-polymyxin-gramicidin], and penicillins.  MEDICATIONS:  Current Outpatient Medications  Medication Sig Dispense Refill   amLODipine-valsartan (EXFORGE) 5-160 MG tablet TAKE 1 TABLET BY MOUTH DAILY 90 tablet 3   Calcium Carbonate-Vitamin D (CALCIUM 600 + D PO) Take 1 tablet by mouth every other day.      celecoxib (CELEBREX) 200 MG capsule Take 200 mg by mouth as needed for mild pain. Every other day.     Cholecalciferol (VITAMIN D3) 5000 UNITS TABS Take 1 tablet by mouth every Monday, Wednesday, and Friday.     clopidogrel (PLAVIX) 75 MG tablet TAKE 1 TABLET BY MOUTH EVERY DAY 90 tablet 1   Coenzyme Q10 (COQ10 PO) Take 200 mg by mouth.     colestipol (COLESTID) 1 g tablet Take 1 g by mouth daily. Take 2 pills daily     doxycycline (VIBRAMYCIN) 100 MG capsule Take 1 capsule (100 mg total) by mouth 2 (two) times daily. 20 capsule 0   ezetimibe (ZETIA) 10 MG tablet Take 10 mg by mouth daily.     guaiFENesin-dextromethorphan (ROBITUSSIN DM) 100-10 MG/5ML syrup Take 10 mLs by mouth every 6 (six) hours as needed for cough. 118 mL 0   hydrochlorothiazide (MICROZIDE) 12.5 MG capsule TAKE 1 CAPSULE(12.5 MG) BY MOUTH DAILY 90 capsule 3   loratadine (CLARITIN) 10 MG tablet Take 10 mg by mouth daily as needed for allergies.      metoprolol tartrate (LOPRESSOR) 25 MG tablet TAKE 1 TABLET BY MOUTH TWICE A DAY 180 tablet 1   Multiple Vitamin (MULTIVITAMIN WITH MINERALS) TABS Take 1 tablet by mouth daily.     pravastatin (PRAVACHOL) 40 MG tablet Take 1  tablet (40 mg total) by mouth at bedtime. 30 tablet 3   risedronate (ACTONEL) 35 MG tablet Take 35 mg by mouth every 7 (seven) days. with water on empty stomach, nothing by mouth or lie down for next 30 minutes.     No current facility-administered medications for this visit.    REVIEW OF SYSTEMS:   Constitutional: Denies fevers, chills or abnormal night sweats Breast: Palpable thickening of the right breast All other systems were reviewed with the patient and are negative.  PHYSICAL EXAMINATION: ECOG PERFORMANCE STATUS: 0 - Asymptomatic  Vitals:   07/16/22 1245  BP: (!) 149/49  Pulse: 70  Resp: 18  Temp: (!) 97.5  F (36.4 C)  SpO2: 100%   Filed Weights   07/16/22 1245  Weight: 90 lb 9.6 oz (41.1 kg)    GENERAL:alert, no distress and comfortable BREAST: Palpable thickening and hardening around the right breast nipple. No palpable axillary or supraclavicular lymphadenopathy (exam performed in the presence of a chaperone)   LABORATORY DATA:  I have reviewed the data as listed Lab Results  Component Value Date   WBC 6.2 07/16/2022   HGB 14.4 07/16/2022   HCT 42.9 07/16/2022   MCV 102.1 (H) 07/16/2022   PLT 238 07/16/2022   Lab Results  Component Value Date   NA 141 07/16/2022   K 3.7 07/16/2022   CL 104 07/16/2022   CO2 32 07/16/2022    RADIOGRAPHIC STUDIES: I have personally reviewed the radiological reports and agreed with the findings in the report.  ASSESSMENT AND PLAN:  Malignant neoplasm of upper-outer quadrant of right breast in female, estrogen receptor positive (HCC) 07/01/2022:Right breast periareolar hardening and redness at 10 o'clock position measuring 1.7 cm with skin thickening extending to the nipple and an additional area 0.9 cm contiguous with the skin axilla, negative biopsy revealed grade 2 ILC ER 95% PR 20% Ki67 10%, HER2 negative 1+ by IHC.  Left breast calcifications retroareolar 9 o'clock position 2.6 cm biopsy was discordant excision  recommended.  Pathology and radiology counseling: Discussed with the patient, the details of pathology including the type of breast cancer,the clinical staging, the significance of ER, PR and HER-2/neu receptors and the implications for treatment. After reviewing the pathology in detail, we proceeded to discuss the different treatment options between surgery, radiation, chemotherapy, antiestrogen therapies.  Treatment plan: Right mastectomy and left lumpectomy Adjuvant radiation Adjuvant antiestrogen therapy  Return to clinic after surgery to discuss final pathology report.  All questions were answered. The patient knows to call the clinic with any problems, questions or concerns.    Tamsen Meek, MD 07/16/22

## 2022-07-16 NOTE — Research (Signed)
[employer] 2021-05 - Specimen Collection Study to Evaluate Biomarkers in Subjects with Cancer   INTRO TO STUDY AND CONSENT FORMS  Patient Madison Richards was identified by Clinical Research as a potential candidate for the above listed study.  This Clinical Research Nurse met with HARUYO BURD, [id number], on 07/16/22 in a manner and location that ensures patient privacy to discuss participation in the above listed research study.  Patient is Accompanied by her daughter and niece .  A copy of the informed consent document with embedded HIPAA language was provided to the patient.  Patient reads, speaks, and understands [place].   Patient was provided with the business card of this Nurse and encouraged to contact the research team with any questions.  Approximately 15 minutes were spent with the patient reviewing the informed consent documents.  Patient was provided the option of taking informed consent documents home to review and was encouraged to review at their convenience with their support network, including other care providers. Patient took the consent documents home to review.  Will f/u with patient next week to determine interest.  Lurena Joiner 'Warden FillersDairl Ponder, RN, BSN, [facility] Clinical Research Nurse I 07/16/22 3:53 PM

## 2022-07-16 NOTE — Assessment & Plan Note (Signed)
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  Left breast calcifications retroareolar 9 o'clock position 2.6 cm biopsy was discordant excision recommended.  Pathology and radiology counseling: Discussed with the patient, the details of pathology including the type of breast cancer,the clinical staging, the significance of ER, PR and HER-2/neu receptors and the implications for treatment. After reviewing the pathology in detail, we proceeded to discuss the different treatment options between surgery, radiation, chemotherapy, antiestrogen therapies.  Treatment plan: Right mastectomy and left lumpectomy Adjuvant radiation Adjuvant antiestrogen therapy  Return to clinic after surgery to discuss final pathology report.

## 2022-07-16 NOTE — Progress Notes (Signed)
[facility] Psychosocial Distress Screening Spiritual Care  Met with Madison Richards, accompanied by her daughter and niece, in Breast Multidisciplinary Clinic to introduce Support Center team/resources, reviewing distress screen per protocol.  The patient scored a 1 on the Psychosocial Distress Thermometer which indicates mild distress. Also assessed for distress and other psychosocial needs.      07/16/2022    4:30 PM  ONCBCN DISTRESS SCREENING  Screening Type Initial Screening  Distress experienced in past week (1-10) 1  Emotional problem type Adjusting to illness  Referral to support programs Yes    Chaplain and patient discussed common feelings and emotions when being diagnosed with cancer, and the importance of support during treatment.  Chaplain informed patient of the support team and support services at [facility].  Chaplain provided contact information and encouraged patient to call with any questions or concerns.  [name] reports little distress and good support. She was eager to complete clinic, so we kept our encounter brief. Introduced Spiritual Care, Energy manager Care, and support programming.  Follow up needed: No. [name] prefers to reach out as needed/desired.   [address] [place], [place], [facility] Pager [phone] Voicemail [phone]

## 2022-07-17 ENCOUNTER — Encounter: Payer: Self-pay | Admitting: Genetic Counselor

## 2022-07-17 NOTE — Progress Notes (Signed)
REFERRING PROVIDER: Serena Croissant, MD [address] [place],  [place] [address]  PRIMARY PROVIDER:  Sigmund Hazel, MD  PRIMARY REASON FOR VISIT:  1. Malignant neoplasm of upper-outer quadrant of right breast in female, estrogen receptor positive (HCC)   2. Family history of breast cancer   3. Family history of ovarian cancer     HISTORY OF PRESENT ILLNESS:   Madison Richards, a 88 y.o. female, was seen for a [place] cancer genetics consultation at the request of Dr. Pamelia Hoit due to a personal history of breast cancer.  Madison Richards presents to clinic today to discuss the possibility of a hereditary predisposition to cancer, to discuss genetic testing, and to further clarify her future cancer risks, as well as potential cancer risks for family members.   In 2024, at the age of 25, Madison Richards was diagnosed with invasive lobular carcinoma of the right breast (ER+/PR+/HER2-). The treatment plan is pending.    CANCER HISTORY:  Oncology History  Malignant neoplasm of upper-outer quadrant of right breast in female, estrogen receptor positive (HCC)  07/01/2022 Initial Diagnosis   Right breast periareolar hardening and redness at 10 o'clock position measuring 1.7 cm with skin thickening extending to the nipple and an additional area 0.9 cm contiguous with the skin axilla, negative biopsy revealed grade 2 ILC ER 95% PR 20% Ki67 10%, HER2 negative 1+ by IHC.  Left breast calcifications retroareolar 9 o'clock position 2.6 cm biopsy was discordant excision recommended.   07/16/2022 Cancer Staging   Staging form: Breast, AJCC 8th Edition - Clinical: Stage IIIB (cT4, cN0, cM0, G2, ER+, PR+, HER2-) - Signed by Serena Croissant, MD on 07/16/2022 Stage prefix: Initial diagnosis Histologic grading system: 3 grade system     Past Medical History:  Diagnosis Date   Abdominal aortic aneurysm (AAA) (HCC)    Generalized headaches    due to neck pain   GERD (gastroesophageal reflux disease)     Hyperlipidemia    Hypertension    Neck pain    Osteoporosis    Stroke [facility])     Past Surgical History:  Procedure Laterality Date   ABDOMINAL ANGIOGRAM Bilateral 03/02/2012   Procedure: ABDOMINAL ANGIOGRAM;  Surgeon: Pamella Pert, MD;  Location: [facility] CATH LAB;  Service: Cardiovascular;  Laterality: Bilateral;   ABDOMINAL ANGIOGRAM  04/06/2012   Procedure: ABDOMINAL ANGIOGRAM;  Surgeon: Pamella Pert, MD;  Location: [facility] CATH LAB;  Service: Cardiovascular;;   abdominal tumor     removed in 1968/benign   APPENDECTOMY     AUGMENTATION MAMMAPLASTY  2000   REMOVED in 2000   BREAST BIOPSY Right 07/01/2022   [place] RT BREAST BX W LOC DEV 1ST LESION IMG BX SPEC US GUIDE 07/01/2022 [facility]   BREAST BIOPSY Left 07/01/2022   [place] LT BREAST BX W LOC DEV 1ST LESION IMG BX SPEC US GUIDE 07/01/2022 [facility]AMMO[facility]   ILIAC ARTERY STENT     right side 2003 and 2014    FAMILY HISTORY:  We obtained a detailed, 4-generation family history.  Significant diagnoses are listed below: Family History  Problem Relation Age of Onset   Throat cancer Brother 39       SCC   Breast cancer Niece 41   Ovarian cancer Niece        dx 50s     Madison Richards is unaware of previous family history of genetic testing for hereditary cancer risks.  There is no reported Ashkenazi Jewish ancestry. There is no known consanguinity.  GENETIC COUNSELING ASSESSMENT: Madison Richards is a 88 y.o. female with a personal and family history of cancer which is somewhat suggestive of a hereditary cancer syndrome and predisposition to cancer given the presence of related cancers (breast, ovarian) in the family. We, therefore, discussed and recommended the following at today's visit.   DISCUSSION: We discussed that 5 - 10% of cancer is hereditary.  Most cases of hereditary breast or ovarian cancer are associated with mutations in BRCA1/2.  There are other genes that can be associated with hereditary breast or ovarian cancer syndromes.   We discussed that testing is beneficial for several reasons including knowing how to follow individuals for their cancer risks, identifying whether potential treatment options would be beneficial, and understanding if other family members could be at risk for cancer and allowing them to undergo genetic testing.   We reviewed the characteristics, features and inheritance patterns of hereditary cancer syndromes. We also discussed genetic testing, including the appropriate family members to test, the process of testing, insurance coverage and turn-around-time for results. We discussed the implications of a negative, positive, carrier and/or variant of uncertain significant result. We recommended Madison Richards pursue genetic testing for a panel that includes genes associated with breast, ovarian, and other cancers.   The Multi-Cancer + RNA Panel offered by [facility] includes sequencing and/or deletion/duplication analysis of the following 70 genes:  AIP*, ALK, APC*, ATM*, AXIN2*, BAP1*, BARD1*, BLM*, BMPR1A*, BRCA1*, BRCA2*, BRIP1*, CDC73*, CDH1*, CDK4, CDKN1B*, CDKN2A, CHEK2*, CTNNA1*, DICER1*, EPCAM (del/dup only), EGFR, FH*, FLCN*, GREM1 (promoter dup only), HOXB13, KIT, LZTR1, MAX*, MBD4, MEN1*, MET, MITF, MLH1*, MSH2*, MSH3*, MSH6*, MUTYH*, NF1*, NF2*, NTHL1*, PALB2*, PDGFRA, PMS2*, POLD1*, POLE*, POT1*, PRKAR1A*, PTCH1*, PTEN*, RAD51C*, RAD51D*, RB1*, RET, SDHA* (sequencing only), SDHAF2*, SDHB*, SDHC*, SDHD*, SMAD4*, SMARCA4*, SMARCB1*, SMARCE1*, STK11*, SUFU*, TMEM127*, TP53*, TSC1*, TSC2*, VHL*. RNA analysis is performed for * genes.  Based on Madison Richards's personal and family history of cancer, she meets medical criteria for genetic testing. Despite that she meets criteria, she may still have an out of pocket cost. We discussed that if her out of pocket cost for testing is over $100, the laboratory should contact her and discuss the self-pay prices and/or patient pay assistance programs.    PLAN: After  considering the risks, benefits, and limitations, Madison Richards provided informed consent to pursue genetic testing and the blood sample was sent to [facility] for analysis of the Multi-Cancer +RNA Panel. Results should be available within approximately 3 weeks' time, at which point they will be disclosed by telephone to Madison Richards, as will any additional recommendations warranted by these results. Madison Richards will receive a summary of her genetic counseling visit and a copy of her results once available. This information will also be available in Epic.    Madison Richards questions were answered to her satisfaction today. Our contact information was provided should additional questions or concerns arise. Thank you for the referral and allowing [place] to share in the care of your patient.   Effa Yarrow M. Rennie Plowman, MS, [facility] Genetic Counselor [email or web address] (P) [phone]  The patient was seen for a total of 15 minutes in face-to-face genetic counseling.  The patient was accompanied by her daughter and her niece.   Drs. Pamelia Hoit and/or Mosetta Putt were available to discuss this case as needed.    _______________________________________________________________________ For Office Staff:  Number of people involved in session: 3 Was an Intern/ student involved with case: no

## 2022-07-18 DIAGNOSIS — R921 Mammographic calcification found on diagnostic imaging of breast: Secondary | ICD-10-CM | POA: Diagnosis not present

## 2022-07-21 ENCOUNTER — Telehealth: Payer: Self-pay

## 2022-07-21 DIAGNOSIS — C50411 Malignant neoplasm of upper-outer quadrant of right female breast: Secondary | ICD-10-CM

## 2022-07-21 NOTE — Telephone Encounter (Signed)
[employer] 2021-05 - Specimen Collection Study to Evaluate Biomarkers in Subjects with Cancer   Called the patient to see if they wanted to participate in the exact study. The patient has declined participation and will no longer be contacted about this study.   Madison Richards, [facility] 07/21/2022 4:47 PM

## 2022-07-22 ENCOUNTER — Encounter (HOSPITAL_COMMUNITY)
Admission: RE | Admit: 2022-07-22 | Discharge: 2022-07-22 | Disposition: A | Discharge status: Home / Self Care | Payer: Medicare PPO | Source: Ambulatory Visit | Attending: Hematology and Oncology | Admitting: Hematology and Oncology

## 2022-07-22 DIAGNOSIS — Z170 Estrogen receptor positive status [ER+]: Secondary | ICD-10-CM | POA: Insufficient documentation

## 2022-07-22 DIAGNOSIS — C50411 Malignant neoplasm of upper-outer quadrant of right female breast: Secondary | ICD-10-CM | POA: Insufficient documentation

## 2022-07-22 DIAGNOSIS — C50919 Malignant neoplasm of unspecified site of unspecified female breast: Secondary | ICD-10-CM | POA: Diagnosis not present

## 2022-07-22 MED ORDER — TECHNETIUM TC 99M MEDRONATE IV KIT
20.0000 | PACK | Freq: Once | INTRAVENOUS | Status: AC | PRN
  Administered 2022-07-22: 21.8 via INTRAVENOUS

## 2022-07-24 ENCOUNTER — Encounter: Payer: Self-pay | Admitting: Cardiology

## 2022-07-24 ENCOUNTER — Ambulatory Visit (HOSPITAL_BASED_OUTPATIENT_CLINIC_OR_DEPARTMENT_OTHER)
Admission: RE | Admit: 2022-07-24 | Discharge: 2022-07-24 | Disposition: A | Discharge status: Home / Self Care | Payer: Medicare PPO | Source: Ambulatory Visit | Attending: Hematology and Oncology | Admitting: Hematology and Oncology

## 2022-07-24 DIAGNOSIS — N200 Calculus of kidney: Secondary | ICD-10-CM | POA: Diagnosis not present

## 2022-07-24 DIAGNOSIS — Z170 Estrogen receptor positive status [ER+]: Secondary | ICD-10-CM | POA: Insufficient documentation

## 2022-07-24 DIAGNOSIS — J439 Emphysema, unspecified: Secondary | ICD-10-CM | POA: Diagnosis not present

## 2022-07-24 DIAGNOSIS — C50411 Malignant neoplasm of upper-outer quadrant of right female breast: Secondary | ICD-10-CM | POA: Diagnosis not present

## 2022-07-24 DIAGNOSIS — C50919 Malignant neoplasm of unspecified site of unspecified female breast: Secondary | ICD-10-CM | POA: Diagnosis not present

## 2022-07-24 DIAGNOSIS — C7951 Secondary malignant neoplasm of bone: Secondary | ICD-10-CM | POA: Diagnosis not present

## 2022-07-24 MED ORDER — IOHEXOL 300 MG/ML  SOLN
100.0000 mL | Freq: Once | INTRAMUSCULAR | Status: AC | PRN
  Administered 2022-07-24: 90 mL via INTRAVENOUS

## 2022-07-25 ENCOUNTER — Telehealth: Payer: Self-pay | Admitting: *Deleted

## 2022-07-25 ENCOUNTER — Encounter: Payer: Self-pay | Admitting: *Deleted

## 2022-07-25 NOTE — Telephone Encounter (Signed)
Spoke with patient to follow up from [facility] 5/29 and assess navigation needs. Patient denies any questions or concerns at this time.  Encouraged her to call should anything arise. Patient verbalized understanding.  

## 2022-07-25 NOTE — Telephone Encounter (Signed)
Spoke with reading room and requested stat reads on patients scans.

## 2022-07-28 ENCOUNTER — Telehealth: Payer: Self-pay

## 2022-07-28 NOTE — Telephone Encounter (Signed)
Called radiology room per Np to request to read ct cap bone scan on pt. Radiology team is aware and will put a note in.

## 2022-07-29 ENCOUNTER — Encounter: Payer: Self-pay | Admitting: *Deleted

## 2022-08-01 ENCOUNTER — Other Ambulatory Visit: Payer: Self-pay | Admitting: Cardiology

## 2022-08-04 ENCOUNTER — Telehealth: Payer: Self-pay | Admitting: Genetic Counselor

## 2022-08-04 ENCOUNTER — Ambulatory Visit: Payer: Self-pay | Admitting: Genetic Counselor

## 2022-08-04 DIAGNOSIS — Z170 Estrogen receptor positive status [ER+]: Secondary | ICD-10-CM

## 2022-08-04 DIAGNOSIS — Z803 Family history of malignant neoplasm of breast: Secondary | ICD-10-CM

## 2022-08-04 DIAGNOSIS — Z8041 Family history of malignant neoplasm of ovary: Secondary | ICD-10-CM

## 2022-08-04 DIAGNOSIS — Z1379 Encounter for other screening for genetic and chromosomal anomalies: Secondary | ICD-10-CM | POA: Insufficient documentation

## 2022-08-04 NOTE — Telephone Encounter (Signed)
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 Discussed that genetic testing is recommended for her niece with ovarian cancer.

## 2022-08-05 ENCOUNTER — Telehealth: Payer: Self-pay | Admitting: Hematology and Oncology

## 2022-08-06 ENCOUNTER — Other Ambulatory Visit: Payer: Self-pay

## 2022-08-06 ENCOUNTER — Inpatient Hospital Stay: Payer: Medicare PPO | Attending: Hematology and Oncology | Admitting: Hematology and Oncology

## 2022-08-06 ENCOUNTER — Telehealth: Payer: Self-pay | Admitting: Pharmacy Technician

## 2022-08-06 ENCOUNTER — Other Ambulatory Visit (HOSPITAL_COMMUNITY): Payer: Self-pay

## 2022-08-06 ENCOUNTER — Encounter: Payer: Self-pay | Admitting: Cardiology

## 2022-08-06 VITALS — BP 118/86 | HR 75 | Temp 97.7°F | Resp 18 | Ht 63.0 in | Wt 89.9 lb

## 2022-08-06 DIAGNOSIS — C7951 Secondary malignant neoplasm of bone: Secondary | ICD-10-CM | POA: Diagnosis not present

## 2022-08-06 DIAGNOSIS — Z79811 Long term (current) use of aromatase inhibitors: Secondary | ICD-10-CM | POA: Diagnosis not present

## 2022-08-06 DIAGNOSIS — Z87891 Personal history of nicotine dependence: Secondary | ICD-10-CM | POA: Insufficient documentation

## 2022-08-06 DIAGNOSIS — C50411 Malignant neoplasm of upper-outer quadrant of right female breast: Secondary | ICD-10-CM | POA: Diagnosis not present

## 2022-08-06 DIAGNOSIS — Z170 Estrogen receptor positive status [ER+]: Secondary | ICD-10-CM | POA: Insufficient documentation

## 2022-08-06 DIAGNOSIS — Z9011 Acquired absence of right breast and nipple: Secondary | ICD-10-CM | POA: Diagnosis not present

## 2022-08-06 DIAGNOSIS — C50919 Malignant neoplasm of unspecified site of unspecified female breast: Secondary | ICD-10-CM | POA: Insufficient documentation

## 2022-08-06 MED ORDER — ABEMACICLIB 50 MG PO TABS
50.0000 mg | ORAL_TABLET | Freq: Two times a day (BID) | ORAL | 1 refills | Status: DC
  Filled 2022-08-06: qty 70, 35d supply, fill #0
  Filled 2022-08-07: qty 56, 28d supply, fill #0
  Filled 2022-08-28: qty 56, 28d supply, fill #1

## 2022-08-06 MED ORDER — LETROZOLE 2.5 MG PO TABS: 2.5000 mg | ORAL_TABLET | Freq: Every day | ORAL | 3 refills | Status: DC

## 2022-08-06 NOTE — Progress Notes (Signed)
Patient Care Team: Sigmund Hazel, MD as PCP - General (Family Medicine) Serena Croissant, MD as Consulting Physician (Hematology and Oncology) Dorothy Puffer, MD as Consulting Physician (Radiation Oncology) [name], MD as Consulting Physician (General Surgery) Pershing Proud, RN as Oncology Nurse Navigator Donnelly Angelica, RN as Oncology Nurse Navigator  DIAGNOSIS:  Encounter Diagnosis  Name Primary?   Malignant neoplasm of upper-outer quadrant of right breast in female, estrogen receptor positive (HCC) Yes    SUMMARY OF ONCOLOGIC HISTORY: Oncology History  Malignant neoplasm of upper-outer quadrant of right breast in female, estrogen receptor positive (HCC)  07/01/2022 Initial Diagnosis   Right breast periareolar hardening and redness at 10 o'clock position measuring 1.7 cm with skin thickening extending to the nipple and an additional area 0.9 cm contiguous with the skin axilla, negative biopsy revealed grade 2 ILC ER 95% PR 20% Ki67 10%, HER2 negative 1+ by IHC.  Left breast calcifications retroareolar 9 o'clock position 2.6 cm biopsy was discordant excision recommended.   07/16/2022 Cancer Staging   Staging form: Breast, AJCC 8th Edition - Clinical: Stage IIIB (cT4, cN0, cM0, G2, ER+, PR+, HER2-) - Signed by Serena Croissant, MD on 07/16/2022 Stage prefix: Initial diagnosis Histologic grading system: 3 grade system   07/23/2022 Genetic Testing   Negative [facility] Multi-Cancer + RNA Panel. 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 Report date is July 23, 2022.  The Multi-Cancer + RNA Panel offered by [facility] includes sequencing and/or deletion/duplication analysis of the following 70 genes:  AIP*, ALK, APC*, ATM*, AXIN2*, BAP1*, BARD1*, BLM*, BMPR1A*, BRCA1*, BRCA2*, BRIP1*, CDC73*, CDH1*, CDK4, CDKN1B*, CDKN2A, CHEK2*, CTNNA1*, DICER1*, EPCAM (del/dup only), EGFR, FH*, FLCN*, GREM1 (promoter dup only), HOXB13, KIT, LZTR1, MAX*, MBD4, MEN1*, MET, MITF, MLH1*, MSH2*, MSH3*, MSH6*,  MUTYH*, NF1*, NF2*, NTHL1*, PALB2*, PDGFRA, PMS2*, POLD1*, POLE*, POT1*, PRKAR1A*, PTCH1*, PTEN*, RAD51C*, RAD51D*, RB1*, RET, SDHA* (sequencing only), SDHAF2*, SDHB*, SDHC*, SDHD*, SMAD4*, SMARCA4*, SMARCB1*, SMARCE1*, STK11*, SUFU*, TMEM127*, TP53*, TSC1*, TSC2*, VHL*. RNA analysis is performed for * genes.     CHIEF COMPLIANT: Follow-up to discuss results of the CT and a bone scan  INTERVAL HISTORY: Madison Richards is a 88 year old with above-mentioned history of right breast cancer who underwent CT scan and bone scan and is here today to discuss the results of the scans.  She is accompanied by her friend.  The scans revealed extensive bone metastases.  They are here to discuss the treatment plan based upon these new findings.   ALLERGIES:  is allergic to bacitracin-polymyxin b, fosamax [alendronate sodium], clindamycin/lincomycin, neosporin [neomycin-polymyxin-gramicidin], and penicillins.  MEDICATIONS:  Current Outpatient Medications  Medication Sig Dispense Refill   abemaciclib (VERZENIO) 50 MG tablet Take 1 tablet (50 mg total) by mouth 2 (two) times daily. 60 tablet 1   amLODipine-valsartan (EXFORGE) 5-160 MG tablet TAKE 1 TABLET BY MOUTH DAILY 90 tablet 1   Calcium Carbonate-Vitamin D (CALCIUM 600 + D PO) Take 1 tablet by mouth every other day.      celecoxib (CELEBREX) 200 MG capsule Take 200 mg by mouth as needed for mild pain. Every other day.     Cholecalciferol (VITAMIN D3) 5000 UNITS TABS Take 1 tablet by mouth every Monday, Wednesday, and Friday.     clopidogrel (PLAVIX) 75 MG tablet TAKE 1 TABLET BY MOUTH EVERY DAY 90 tablet 1   Coenzyme Q10 (COQ10 PO) Take 200 mg by mouth.     colestipol (COLESTID) 1 g tablet Take 1 g by mouth daily. Take 2 pills daily  doxycycline (VIBRAMYCIN) 100 MG capsule Take 1 capsule (100 mg total) by mouth 2 (two) times daily. 20 capsule 0   ezetimibe (ZETIA) 10 MG tablet Take 10 mg by mouth daily.     guaiFENesin-dextromethorphan (ROBITUSSIN DM)  100-10 MG/5ML syrup Take 10 mLs by mouth every 6 (six) hours as needed for cough. 118 mL 0   hydrochlorothiazide (MICROZIDE) 12.5 MG capsule TAKE 1 CAPSULE(12.5 MG) BY MOUTH DAILY 90 capsule 1   letrozole (FEMARA) 2.5 MG tablet Take 1 tablet (2.5 mg total) by mouth daily. 90 tablet 3   loratadine (CLARITIN) 10 MG tablet Take 10 mg by mouth daily as needed for allergies.      metoprolol tartrate (LOPRESSOR) 25 MG tablet TAKE 1 TABLET BY MOUTH TWICE A DAY 180 tablet 1   Multiple Vitamin (MULTIVITAMIN WITH MINERALS) TABS Take 1 tablet by mouth daily.     pravastatin (PRAVACHOL) 40 MG tablet Take 1 tablet (40 mg total) by mouth at bedtime. 30 tablet 3   risedronate (ACTONEL) 35 MG tablet Take 35 mg by mouth every 7 (seven) days. with water on empty stomach, nothing by mouth or lie down for next 30 minutes.     No current facility-administered medications for this visit.    PHYSICAL EXAMINATION: ECOG PERFORMANCE STATUS: 2 - Symptomatic, <50% confined to bed  Vitals:   08/06/22 1430  BP: 118/86  Pulse: 75  Resp: 18  Temp: 97.7 F (36.5 C)  SpO2: 97%   Filed Weights   08/06/22 1430  Weight: 89 lb 14.4 oz (40.8 kg)     LABORATORY DATA:  I have reviewed the data as listed    Latest Ref Rng & Units 07/16/2022   12:26 PM 02/08/2018    6:15 PM 05/27/2014    3:53 AM  CMP  Glucose 70 - 99 mg/dL 578  469  629   BUN 8 - 23 mg/dL 23  18  23    Creatinine 0.44 - 1.00 mg/dL 5.28  4.13  2.44   Sodium 135 - 145 mmol/L 141  137  139   Potassium 3.5 - 5.1 mmol/L 3.7  3.6  3.8   Chloride 98 - 111 mmol/L 104  107  109   CO2 22 - 32 mmol/L 32  21  27   Calcium 8.9 - 10.3 mg/dL 01.0  9.9  8.7   Total Protein 6.5 - 8.1 g/dL 6.8  7.1  6.2   Total Bilirubin 0.3 - 1.2 mg/dL 0.5  0.5  0.7   Alkaline Phos 38 - 126 U/L 74  57  67   AST 15 - 41 U/L 25  24  25    ALT 0 - 44 U/L 12  12  15      Lab Results  Component Value Date   WBC 6.2 07/16/2022   HGB 14.4 07/16/2022   HCT 42.9 07/16/2022   MCV  102.1 (H) 07/16/2022   PLT 238 07/16/2022   NEUTROABS 4.7 07/16/2022    ASSESSMENT & PLAN:  Malignant neoplasm of upper-outer quadrant of right breast in female, estrogen receptor positive (HCC) 07/01/2022:Right breast periareolar hardening and redness at 10 o'clock position measuring 1.7 cm with skin thickening extending to the nipple and an additional area 0.9 cm contiguous with the skin axilla, negative biopsy revealed grade 2 ILC ER 95% PR 20% Ki67 10%, HER2 negative 1+ by IHC.  Left breast calcifications retroareolar 9 o'clock position 2.6 cm biopsy was discordant excision recommended.   07/28/2022: CT CAP: Widespread bone  metastases throughout the visualized bone structures, 6 mm right upper lobe pulm nodule, bilateral adrenal adenomas, 2.7 cm aortic aneurysm, emphysema 07/28/2022: Bone scan: Diffuse osseous metastatic disease  Discussion: Given the evidence of bone metastases the indication for definitive curative intent treatment would be if there is concern for skin breakdown or pain.  The goal of treatment would be palliation.  Recommendation: Verzinio with letrozole Bone metastases: Recommended bisphosphonate therapy with either Xgeva or Zometa. Patient does not have any dentition and therefore no dental evaluation is necessary.  Abemaciclib counseling: I discussed at length the risks and benefits of Abemaciclib in combination with letrozole. Adverse effects of Abemaciclib include decreasing neutrophil count, pneumonia, blood clots in lungs as well as nausea and GI symptoms. Side effects of letrozole include hot flashes, muscle aches and pains, uterine bleeding/spotting/cancer, osteoporosis, risk of blood clots.  I will start the patient to 50 mg p.o. twice daily and will be titrated upwards.  Return to clinic in 3 weeks to follow-up with Jonny Ruiz (will plan for [name] when she sees John)   No orders of the defined types were placed in this encounter.  The patient has a good understanding  of the overall plan. she agrees with it. she will call with any problems that may develop before the next visit here. Total time spent: 30 mins including face to face time and time spent for planning, charting and co-ordination of care   Tamsen Meek, MD 08/06/22    I Janan Ridge am acting as a Neurosurgeon for [employer]  I have reviewed the above documentation for accuracy and completeness, and I agree with the above.

## 2022-08-06 NOTE — Addendum Note (Signed)
Addended by: Serena Croissant on: 08/06/2022 04:51 PM   Modules accepted: Orders

## 2022-08-06 NOTE — Assessment & Plan Note (Signed)
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  Left breast calcifications retroareolar 9 o'clock position 2.6 cm biopsy was discordant excision recommended.   07/28/2022: CT CAP: Widespread bone metastases throughout the visualized bone structures, 6 mm right upper lobe pulm nodule, bilateral adrenal adenomas, 2.7 cm aortic aneurysm, emphysema 07/28/2022: Bone scan: Diffuse osseous metastatic disease  Discussion: Given the evidence of bone metastases the indication for definitive curative intent treatment would be if there is concern for skin breakdown or pain.  The goal of treatment would be palliation.  Recommendation: Verzinio with letrozole Abemaciclib counseling: I discussed at length the risks and benefits of Abemaciclib in combination with letrozole. Adverse effects of Abemaciclib include decreasing neutrophil count, pneumonia, blood clots in lungs as well as nausea and GI symptoms. Side effects of letrozole include hot flashes, muscle aches and pains, uterine bleeding/spotting/cancer, osteoporosis, risk of blood clots.  Return to clinic in 3 weeks to follow-up with Jonny Ruiz

## 2022-08-06 NOTE — Telephone Encounter (Signed)
Oral Oncology Patient Advocate Encounter   Received notification that prior authorization for Verzenio is required.   PA submitted on 08/06/22 Key [id number] Status is pending     Jinger Neighbors, CPhT-Adv Oncology Pharmacy Patient Advocate [facility] Direct Number: [phone]  Fax: [phone]

## 2022-08-07 ENCOUNTER — Encounter: Payer: Self-pay | Admitting: *Deleted

## 2022-08-07 ENCOUNTER — Telehealth: Payer: Self-pay

## 2022-08-07 ENCOUNTER — Other Ambulatory Visit (HOSPITAL_COMMUNITY): Payer: Self-pay

## 2022-08-07 ENCOUNTER — Other Ambulatory Visit: Payer: Self-pay

## 2022-08-07 NOTE — Telephone Encounter (Signed)
Oral Oncology Patient Advocate Encounter  Prior Authorization for [name] has been approved.    PA# [id number] Effective dates: 08/07/22 through 02/03/23  Patients co-pay is $100.    Jinger Neighbors, CPhT-Adv Oncology Pharmacy Patient Advocate [facility] Direct Number: [phone]  Fax: [phone]

## 2022-08-07 NOTE — Telephone Encounter (Signed)
Oral Chemotherapy Pharmacist Encounter  I spoke with patient for overview of: Verzenio for the treatment of metastatic, hormone-receptor positive breast cancer, in combination with letrozole, planned duration until disease progression or unacceptable toxicity.   Counseled patient on administration, dosing, side effects, monitoring, drug-food interactions, safe handling, storage, and disposal.  Patient will take Verzenio 50mg  tablets, 1 tablet by mouth twice daily without regard to food.  Patient knows to avoid grapefruit and grapefruit juice.  Patient is taking letrozole once daily.  Verzenio start date: 08/09/2022  Adverse effects include but are not limited to: diarrhea, fatigue, nausea, abdominal pain, decreased blood counts, and increased liver function tests, and joint pains. Severe, life-threatening, and/or fatal interstitial lung disease (ILD) and/or pneumonitis may occur with CDK 4/6 inhibitors.  Patient has anti-emetic on hand and knows to take it if nausea develops.   Patient will obtain anti diarrheal and alert the office of 4 or more loose stools above baseline.  Reviewed with patient importance of keeping a medication schedule and plan for any missed doses. No barriers to medication adherence identified.  Medication reconciliation performed and medication/allergy list updated.  Insurance authorization for [employer] has been obtained. Test claim at the pharmacy revealed copayment $100 for 1st fill of 28. Patient will pick up medication from [facility] on 08/08/2022.   Patient informed the pharmacy will reach out 5-7 days prior to needing next fill of Verzenio to coordinate continued medication acquisition to prevent break in therapy.  All questions answered.  Patient voiced understanding and appreciation.   Medication education handout placed in mail for patient. Patient knows to call the office with questions or concerns. Oral Chemotherapy Clinic phone  number provided to patient.   Bethel Born, PharmD Hematology/Oncology Clinical Pharmacist [facility] Oral Chemotherapy Navigation Clinic [phone] 08/07/2022   12:05 PM

## 2022-08-07 NOTE — Telephone Encounter (Signed)
Oral Oncology Pharmacist Encounter  Received new prescription for abemaciclib (Verzenio) for the treatment of ER positive, HER2 negative, metastatic breast cancer in conjunction with letrozole, planned duration until disease progression or unacceptable toxicity.  Labs from 07/16/2022 (CBC, CMP) assessed, no interventions needed. Prescription dose and frequency assessed for appropriateness.  Current medication list in Epic reviewed, no significant/ relevant DDIs with verzenio identified.  Evaluated chart and no patient barriers to medication adherence noted.   Patient agreement for treatment documented in MD note on 08/06/2022.  Prescription has been e-scribed to the [facility] for benefits analysis and approval.  Oral Oncology Clinic will continue to follow for insurance authorization, copayment issues, initial counseling and start date.  Madison Richards, PharmD Hematology/Oncology Clinical Pharmacist [facility] Oral Chemotherapy Navigation Clinic [phone] 08/07/2022 8:17 AM

## 2022-08-08 ENCOUNTER — Telehealth: Payer: Self-pay

## 2022-08-08 NOTE — Telephone Encounter (Signed)
Called and notified that Pt will now receive Zometa infusion vs injection. Advised that scheduler will be in touch to set up appts. Pt verbalized understanding.

## 2022-08-11 ENCOUNTER — Telehealth: Payer: Self-pay | Admitting: Pharmacist

## 2022-08-11 NOTE — Telephone Encounter (Signed)
Scheduled appointment per staff message. Patient is aware of the made appointment. 

## 2022-08-22 ENCOUNTER — Encounter: Payer: Self-pay | Admitting: Hematology and Oncology

## 2022-08-22 NOTE — Progress Notes (Signed)
HPI:   Madison Richards was previously seen in the [facility] due to a personal history of breast cancer and concerns regarding a hereditary predisposition to cancer.    Madison Richards recent genetic test results were disclosed to her by telephone. These results and recommendations are discussed in more detail below.  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 The treatment plan is pending.     Oncology History  Malignant neoplasm of upper-outer quadrant of right breast in female, estrogen receptor positive (HCC)  07/01/2022 Initial Diagnosis   Right breast periareolar hardening and redness at 10 o'clock position measuring 1.7 cm with skin thickening extending to the nipple and an additional area 0.9 cm contiguous with the skin axilla, negative biopsy revealed grade 2 ILC ER 95% PR 20% Ki67 10%, HER2 negative 1+ by IHC.  Left breast calcifications retroareolar 9 o'clock position 2.6 cm biopsy was discordant excision recommended.   07/16/2022 Cancer Staging   Staging form: Breast, AJCC 8th Edition - Clinical: Stage IIIB (cT4, cN0, cM0, G2, ER+, PR+, HER2-) - Signed by Serena Croissant, MD on 07/16/2022 Stage prefix: Initial diagnosis Histologic grading system: 3 grade system   07/23/2022 Genetic Testing   Negative [facility] Multi-Cancer + RNA Panel. 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 Report date is July 23, 2022.  The Multi-Cancer + RNA Panel offered by [facility] includes sequencing and/or deletion/duplication analysis of the following 70 genes:  AIP*, ALK, APC*, ATM*, AXIN2*, BAP1*, BARD1*, BLM*, BMPR1A*, BRCA1*, BRCA2*, BRIP1*, CDC73*, CDH1*, CDK4, CDKN1B*, CDKN2A, CHEK2*, CTNNA1*, DICER1*, EPCAM (del/dup only), EGFR, FH*, FLCN*, GREM1 (promoter dup only), HOXB13, KIT, LZTR1, MAX*, MBD4, MEN1*, MET, MITF, MLH1*, MSH2*, MSH3*, MSH6*, MUTYH*, NF1*, NF2*, NTHL1*, PALB2*, PDGFRA,  PMS2*, POLD1*, POLE*, POT1*, PRKAR1A*, PTCH1*, PTEN*, RAD51C*, RAD51D*, RB1*, RET, SDHA* (sequencing only), SDHAF2*, SDHB*, SDHC*, SDHD*, SMAD4*, SMARCA4*, SMARCB1*, SMARCE1*, STK11*, SUFU*, TMEM127*, TP53*, TSC1*, TSC2*, VHL*. RNA analysis is performed for * genes.    FAMILY HISTORY:  We obtained a detailed, 4-generation family history.  Significant diagnoses are listed below:      Family History  Problem Relation Age of Onset   Throat cancer Brother 7        SCC   Breast cancer Niece 6   Ovarian cancer Niece          dx 71s       Madison Richards is unaware of previous family history of genetic testing for hereditary cancer risks.  There is no reported Ashkenazi Jewish ancestry. There is no known consanguinity.  GENETIC TEST RESULTS:  The [facility] Multi-Cancer +RNA Panel found no pathogenic mutations.   The Multi-Cancer + RNA Panel offered by [facility] includes sequencing and/or deletion/duplication analysis of the following 70 genes:  AIP*, ALK, APC*, ATM*, AXIN2*, BAP1*, BARD1*, BLM*, BMPR1A*, BRCA1*, BRCA2*, BRIP1*, CDC73*, CDH1*, CDK4, CDKN1B*, CDKN2A, CHEK2*, CTNNA1*, DICER1*, EPCAM (del/dup only), EGFR, FH*, FLCN*, GREM1 (promoter dup only), HOXB13, KIT, LZTR1, MAX*, MBD4, MEN1*, MET, MITF, MLH1*, MSH2*, MSH3*, MSH6*, MUTYH*, NF1*, NF2*, NTHL1*, PALB2*, PDGFRA, PMS2*, POLD1*, POLE*, POT1*, PRKAR1A*, PTCH1*, PTEN*, RAD51C*, RAD51D*, RB1*, RET, SDHA* (sequencing only), SDHAF2*, SDHB*, SDHC*, SDHD*, SMAD4*, SMARCA4*, SMARCB1*, SMARCE1*, STK11*, SUFU*, TMEM127*, TP53*, TSC1*, TSC2*, VHL*. RNA analysis is performed for * genes.   The test report has been scanned into EPIC and is located under the Molecular Pathology section of the Results Review tab.  A portion of the result report is included  below for reference. Genetic testing reported out on July 23, 2022.     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  At this  time, it is unknown if this variant is associated with an increased risk for cancer or if it is benign, but most uncertain variants are reclassified to benign. It should not be used to make medical management decisions. With time, we suspect the laboratory will determine the significance of this variant, if any. If the laboratory reclassifies this variant, we will attempt to contact Madison Richards to discuss it further.   Even though a pathogenic variant was not identified, possible explanations for the cancer in the family may include: There may be no hereditary risk for cancer in the family. The cancers in Madison Richards and/or her family may be sporadic/familial or due to other genetic and environmental factors. There may be a gene mutation in one of these genes that current testing methods cannot detect but that chance is small. There could be another gene that has not yet been discovered, or that we have not yet tested, that is responsible for the cancer diagnoses in the family.  It is also possible there is a hereditary cause for the cancer in the family that Madison Richards did not inherit. The variant of uncertain significance detected in the BARD1 gene may be reclassified as a pathogenic variant in the future. At this time, we do not know if this variant increases the risk for cancer.  Therefore, it is important to remain in touch with cancer genetics in the future so that we can continue to offer Madison Richards the most up to date genetic testing.   ADDITIONAL GENETIC TESTING:   Madison Richards genetic testing was fairly extensive.  If there are additional relevant genes identified to increase cancer risk that can be analyzed in the future, we would be happy to discuss and coordinate this testing at that time.    CANCER SCREENING RECOMMENDATIONS:  Madison Richards test result is considered negative (normal).  This means that we have not identified a hereditary cause for her personal history of breast cancer at this  time.   An individual's cancer risk and medical management are not determined by genetic test results alone. Overall cancer risk assessment incorporates additional factors, including personal medical history, family history, and any available genetic information that may result in a personalized plan for cancer prevention and surveillance. Therefore, it is recommended she continue to follow the cancer management and screening guidelines provided by her oncology and primary healthcare provider.  RECOMMENDATIONS FOR FAMILY MEMBERS:   Since she did not inherit a identifiable mutation in a cancer predisposition gene included on this panel, her children could not have inherited a known mutation from her in one of these genes. Individuals in this family might be at some increased risk of developing cancer, over the general population risk, due to the family history of cancer.  Individuals in the family should notify their providers of the family history of cancer. We recommend women in this family have a yearly mammogram beginning at age 36, or 82 years younger than the earliest onset of cancer, an annual clinical breast exam, and perform monthly breast self-exams.  Risk models that take into account family history and hormonal history may be helpful in determining appropriate breast cancer screening options for family members.  Other members of the family may still carry a pathogenic variant in one of these  genes that Madison Richards did not inherit. Based on the family history, we recommend her niece, who was diagnosed with ovarian cancer, have genetic counseling and testing. Madison Richards can let us know if we can be of any assistance in coordinating genetic counseling and/or testing for these family member.   We do not recommend familial testing for the BARD1 variant of uncertain significance (VUS).  FOLLOW-UP:  Cancer genetics is a rapidly advancing field and it is possible that new genetic tests will be  appropriate for her and/or her family members in the future. We encourage Madison Richards to remain in contact with cancer genetics, so we can update her personal and family histories and let her know of advances in cancer genetics that may benefit this family.   Our contact number was provided.  She knows she is welcome to call us at anytime with additional questions or concerns.   Madison Richards M. Rennie Plowman, MS, [facility] Genetic Counselor [email or web address] (P) [phone]

## 2022-08-26 ENCOUNTER — Other Ambulatory Visit: Payer: Self-pay

## 2022-08-27 ENCOUNTER — Inpatient Hospital Stay: Payer: Medicare PPO

## 2022-08-27 ENCOUNTER — Inpatient Hospital Stay: Payer: Medicare PPO | Attending: Hematology and Oncology

## 2022-08-27 ENCOUNTER — Inpatient Hospital Stay: Payer: Medicare PPO | Admitting: Pharmacist

## 2022-08-27 ENCOUNTER — Other Ambulatory Visit: Payer: Self-pay

## 2022-08-27 VITALS — BP 104/60 | HR 71 | Temp 97.2°F | Resp 18 | Ht 63.0 in | Wt 90.0 lb

## 2022-08-27 DIAGNOSIS — R197 Diarrhea, unspecified: Secondary | ICD-10-CM | POA: Insufficient documentation

## 2022-08-27 DIAGNOSIS — R5383 Other fatigue: Secondary | ICD-10-CM | POA: Diagnosis not present

## 2022-08-27 DIAGNOSIS — Z87891 Personal history of nicotine dependence: Secondary | ICD-10-CM | POA: Insufficient documentation

## 2022-08-27 DIAGNOSIS — C50919 Malignant neoplasm of unspecified site of unspecified female breast: Secondary | ICD-10-CM

## 2022-08-27 DIAGNOSIS — C50411 Malignant neoplasm of upper-outer quadrant of right female breast: Secondary | ICD-10-CM | POA: Diagnosis not present

## 2022-08-27 DIAGNOSIS — Z170 Estrogen receptor positive status [ER+]: Secondary | ICD-10-CM | POA: Diagnosis not present

## 2022-08-27 DIAGNOSIS — Z79811 Long term (current) use of aromatase inhibitors: Secondary | ICD-10-CM | POA: Diagnosis not present

## 2022-08-27 LAB — CBC WITH DIFFERENTIAL (CANCER CENTER ONLY)
Abs Immature Granulocytes: 0.02 10*3/uL (ref 0.00–0.07)
Basophils Absolute: 0 10*3/uL (ref 0.0–0.1)
Basophils Relative: 1 %
Eosinophils Absolute: 0.1 10*3/uL (ref 0.0–0.5)
Eosinophils Relative: 2 %
HCT: 37.9 % (ref 36.0–46.0)
Hemoglobin: 12.8 g/dL (ref 12.0–15.0)
Immature Granulocytes: 0 %
Lymphocytes Relative: 12 %
Lymphs Abs: 0.7 10*3/uL (ref 0.7–4.0)
MCH: 34.9 pg — ABNORMAL HIGH (ref 26.0–34.0)
MCHC: 33.8 g/dL (ref 30.0–36.0)
MCV: 103.3 fL — ABNORMAL HIGH (ref 80.0–100.0)
Monocytes Absolute: 0.4 10*3/uL (ref 0.1–1.0)
Monocytes Relative: 8 %
Neutro Abs: 4.1 10*3/uL (ref 1.7–7.7)
Neutrophils Relative %: 77 %
Platelet Count: 191 10*3/uL (ref 150–400)
RBC: 3.67 MIL/uL — ABNORMAL LOW (ref 3.87–5.11)
RDW: 15.2 % (ref 11.5–15.5)
WBC Count: 5.3 10*3/uL (ref 4.0–10.5)
nRBC: 0 % (ref 0.0–0.2)

## 2022-08-27 LAB — CMP (CANCER CENTER ONLY)
ALT: 18 U/L (ref 0–44)
AST: 32 U/L (ref 15–41)
Albumin: 3.8 g/dL (ref 3.5–5.0)
Alkaline Phosphatase: 107 U/L (ref 38–126)
Anion gap: 7 (ref 5–15)
BUN: 31 mg/dL — ABNORMAL HIGH (ref 8–23)
CO2: 29 mmol/L (ref 22–32)
Calcium: 10.4 mg/dL — ABNORMAL HIGH (ref 8.9–10.3)
Chloride: 103 mmol/L (ref 98–111)
Creatinine: 1.04 mg/dL — ABNORMAL HIGH (ref 0.44–1.00)
GFR, Estimated: 53 mL/min — ABNORMAL LOW (ref >60)
Glucose, Bld: 130 mg/dL — ABNORMAL HIGH (ref 70–99)
Potassium: 4.4 mmol/L (ref 3.5–5.1)
Sodium: 139 mmol/L (ref 135–145)
Total Bilirubin: 0.5 mg/dL (ref 0.3–1.2)
Total Protein: 6.9 g/dL (ref 6.5–8.1)

## 2022-08-27 NOTE — Progress Notes (Signed)
[facility]       Telephone: [phone]?Fax: [phone]   Oncology Clinical Pharmacist Practitioner Initial Assessment  Madison Richards is a 88 y.o. female with a diagnosis of breast cancer. They were contacted today via in-person visit. She is accompanied by her friend Madison Richards.  Indication/Regimen Abemaciclib (Verzenio) is being used appropriately for treatment of metastatic breast cancer by Dr. Serena Croissant.      Wt Readings from Last 1 Encounters:  08/27/22 90 lb (40.8 kg)    Estimated body surface area is 1.35 meters squared as calculated from the following:   Height as of this encounter: 5\' 3"  (1.6 m).   Weight as of this encounter: 90 lb (40.8 kg).  The dosing regimen is 50 mg by mouth every 12 hours on days 1 to 28 of a 28-day cycle. This is being given  in combination with letrozole. She was to start zoledronic acid today but her CrCl is < 30 estimated at 25.5 mL/min. Therefore, we will try to get denosumab 120 mg every 12 weeks approved. Plan will be placed today . It is planned to continue until disease progression or unacceptable toxicity. Madison Richards is doing well.  She is having some constipation but it is manageable.  We did recommend that she could consider trying senna-S but that she will just need to monitor for diarrhea since abemaciclib is known to cause this side effect.  As above, she was to start zoledronic acid today but her creatinine clearance is estimated at 25.5 ml/min which is lower than the less than 30 mL/min threshold.  She is having some fatigue but it is manageable.  We did give her dental clearance today that she will try to get prior to her denosumab administration in 2 weeks.  However, Dr. Pamelia Hoit said that this is not necessary if we need to start prior to her dental clearance.  We will plan on seeing her again with labs in 2 weeks and then we will see her every 2 weeks for the first 2 months followed by monthly labs for 2 months and then  likely she will have labs every 12 weeks when received denosumab.   Dose Modifications She is starting at a reduced dose of abemaciclib at 50 mg every 12 hours per Dr. Pamelia Hoit  Access Assessment Madison Richards will be receiving abemaciclib through [facility] Concerns: None Start date if known: 08/09/22  Allergies Allergies  Allergen Reactions   Bacitracin-Polymyxin B     Other reaction(s): AREA APPLIED TO SPREAD   Fosamax [Alendronate Sodium]     Leg cramps   Clindamycin/Lincomycin Rash    Rash in mouth   Neosporin [Neomycin-Polymyxin-Gramicidin] Rash   Penicillins Rash    Rash in mouth    Vitals    08/27/2022   10:04 AM 08/06/2022    2:30 PM 07/16/2022   12:45 PM  Oncology Vitals  Height 160 cm 160 cm 160 cm  Weight 40.824 kg 40.778 kg 41.096 kg  Weight (lbs) 90 lbs 89 lbs 14 oz 90 lbs 10 oz  BMI 15.94 kg/m2   15.94 kg/m2 15.93 kg/m2   15.93 kg/m2 16.05 kg/m2   16.05 kg/m2  Temp 97.2 F (36.2 C) 97.7 F (36.5 C) 97.5 F (36.4 C)  Pulse Rate 71 75 70  BP 104/60 118/86 149/49  Resp 18 18 18   SpO2 100 % 97 % 100 %  BSA (m2) 1.35 m2   1.35 m2 1.35 m2  1.35 m2 1.35 m2   1.35 m2     Laboratory Data    Latest Ref Rng & Units 08/27/2022    9:22 AM 07/16/2022   12:26 PM 02/09/2018   11:43 AM  CBC EXTENDED  WBC 4.0 - 10.5 K/uL 5.3  6.2    RBC 3.87 - 5.11 MIL/uL 3.67  4.20  4.28   Hemoglobin 12.0 - 15.0 g/dL 16.1  09.6    HCT 04.5 - 46.0 % 37.9  42.9    Platelets 150 - 400 K/uL 191  238    NEUT# 1.7 - 7.7 K/uL 4.1  4.7    Lymph# 0.7 - 4.0 K/uL 0.7  0.8         Latest Ref Rng & Units 08/27/2022    9:22 AM 07/16/2022   12:26 PM 02/08/2018    6:15 PM  CMP  Glucose 70 - 99 mg/dL 409  811  914   BUN 8 - 23 mg/dL 31  23  18    Creatinine 0.44 - 1.00 mg/dL 7.82  9.56  2.13   Sodium 135 - 145 mmol/L 139  141  137   Potassium 3.5 - 5.1 mmol/L 4.4  3.7  3.6   Chloride 98 - 111 mmol/L 103  104  107   CO2 22 - 32 mmol/L 29  32  21    Calcium 8.9 - 10.3 mg/dL 08.6  57.8  9.9   Total Protein 6.5 - 8.1 g/dL 6.9  6.8  7.1   Total Bilirubin 0.3 - 1.2 mg/dL 0.5  0.5  0.5   Alkaline Phos 38 - 126 U/L 107  74  57   AST 15 - 41 U/L 32  25  24   ALT 0 - 44 U/L 18  12  12     Contraindications Contraindications were reviewed? Yes Contraindications to therapy were identified? No   Safety Precautions The following safety precautions for the use of abemaciclib were reviewed:  Changes in kidney function: importance of drinking plenty of fluids and monitoring urine output Diarrhea: we reviewed that diarrhea is common with abemaciclib and confirmed that she does have loperamide (Imodium) at home.  We reviewed how to take this medication PRN and gave her information on abemaciclib Decreased white blood cells (WBCs) and increased risk for infection: we discussed the importance of having a thermometer and what the Centers for Disease Control and Prevention (CDC) considers a fever which is 100.87F (38C) or higher.  Gave patient 24/7 triage line to call if any fevers or symptoms Decreased hemoglobin, part of red blood cells that carry iron and oxygen Fatigue Nausea and Vomiting Hepatotoxicity: reviewed to contact clinic for RUQ pain that will not subside, yellowing of eyes/skin Decreased appetite or weight loss Abdominal pain Decreased platelet count and increased risk for bleeding Venous thromboembolism (VTE): reviewed signs of deep vein thrombosis (DVT) such as leg swelling, redness, pain, or tenderness and signs of pulmonary embolism (PE) such as shortness of breath, rapid or irregular heartbeat, cough, chest pain, or lightheadedness ILD/Pneumonitis: we reviewed potential symptoms including cough, shortness, and fatigue. Handling body fluids and waste Pregnancy, sexual activity, and contraception Reviewed to take the medication every 12 hours (with food sometimes can be easier on the stomach) and to take it at the same time every day.  Discussed proper storage and handling of abemaciclib She is on hydrochlorothiazide so we will need to monitor her electrolytes closely  Medication Reconciliation Current Outpatient Medications  Medication Sig Dispense Refill  abemaciclib (VERZENIO) 50 MG tablet Take 1 tablet (50 mg total) by mouth 2 (two) times daily. 60 tablet 1   amLODipine-valsartan (EXFORGE) 5-160 MG tablet TAKE 1 TABLET BY MOUTH DAILY 90 tablet 1   Calcium Carbonate-Vitamin D (CALCIUM 600 + D PO) Take 1 tablet by mouth every other day.      Cholecalciferol (VITAMIN D3) 5000 UNITS TABS Take 1 tablet by mouth every Monday, Wednesday, and Friday.     clopidogrel (PLAVIX) 75 MG tablet TAKE 1 TABLET BY MOUTH EVERY DAY 90 tablet 1   Coenzyme Q10 (COQ10 PO) Take 200 mg by mouth.     colestipol (COLESTID) 1 g tablet Take 1 g by mouth daily. Take 2 pills daily     ezetimibe (ZETIA) 10 MG tablet Take 10 mg by mouth daily.     hydrochlorothiazide (MICROZIDE) 12.5 MG capsule TAKE 1 CAPSULE(12.5 MG) BY MOUTH DAILY 90 capsule 1   letrozole (FEMARA) 2.5 MG tablet Take 1 tablet (2.5 mg total) by mouth daily. 90 tablet 3   loratadine (CLARITIN) 10 MG tablet Take 10 mg by mouth daily as needed for allergies.      metoprolol tartrate (LOPRESSOR) 25 MG tablet TAKE 1 TABLET BY MOUTH TWICE A DAY 180 tablet 1   Multiple Vitamin (MULTIVITAMIN WITH MINERALS) TABS Take 1 tablet by mouth daily.     pravastatin (PRAVACHOL) 40 MG tablet Take 1 tablet (40 mg total) by mouth at bedtime. 30 tablet 3   No current facility-administered medications for this visit.   Medication reconciliation is based on the patient's most recent medication list in the electronic medical record (EMR) including herbal products and OTC medications.   The patient's medication list was reviewed today with the patient? Yes   Drug-drug interactions (DDIs) DDIs were evaluated? Yes Significant DDIs identified? No   Drug-Food Interactions Drug-food interactions were  evaluated? Yes Drug-food interactions identified?  Avoid grapefruit products  Follow-up Plan  Continue abemaciclib 50 mg every 12 hours by mouth continue letrozole 2.5 mg by mouth daily She will not start zoledronic acid today due to renal function.  We will place a therapy plan in 4 denosumab 120 mg every 12 weeks due to this.  We will notify the prior authorization team. We will add labs, pharmacy clinic visit, in 2 weeks Ms. Housey will work on getting dental clearance although if needed she can start without dental clearance per Dr. Nanda Quinton participated in the discussion, expressed understanding, and voiced agreement with the above plan. All questions were answered to her satisfaction. The patient was advised to contact the clinic at [phone] with any questions or concerns prior to her return visit.   I spent 60 minutes assessing the patient.  Haddon Fyfe A. Odetta Pink, PharmD, BCOP, CPP  Anselm Lis, RPH-CPP, 08/27/2022 10:43 AM  **Disclaimer: This note was dictated with voice recognition software. Similar sounding words can inadvertently be transcribed and this note may contain transcription errors which may not have been corrected upon publication of note.**

## 2022-08-28 ENCOUNTER — Other Ambulatory Visit (HOSPITAL_COMMUNITY): Payer: Self-pay

## 2022-08-28 LAB — CANCER ANTIGEN 27.29: CA 27.29: 78 U/mL — ABNORMAL HIGH (ref 0.0–38.6)

## 2022-08-28 LAB — CANCER ANTIGEN 15-3: CA 15-3: 72.3 U/mL — ABNORMAL HIGH (ref 0.0–25.0)

## 2022-08-28 NOTE — Progress Notes (Signed)
Dental clearance received from [facility] for Pt's upcoming Xgeva injections. Called [facility] for recommendation clarification as script was difficult to read. Scanned under media.

## 2022-08-29 ENCOUNTER — Telehealth: Payer: Self-pay | Admitting: Pharmacist

## 2022-08-29 NOTE — Telephone Encounter (Signed)
Scheduled appointments per los. Patient is aware of the made appointments. 

## 2022-09-01 ENCOUNTER — Other Ambulatory Visit (HOSPITAL_COMMUNITY): Payer: Self-pay

## 2022-09-07 NOTE — Progress Notes (Signed)
Patient Care Team: Sigmund Hazel, MD as PCP - General (Family Medicine) Serena Croissant, MD as Consulting Physician (Hematology and Oncology) Dorothy Puffer, MD as Consulting Physician (Radiation Oncology) [name], MD as Consulting Physician (General Surgery) Pershing Proud, RN as Oncology Nurse Navigator Donnelly Angelica, RN as Oncology Nurse Navigator  DIAGNOSIS: No diagnosis found.  SUMMARY OF ONCOLOGIC HISTORY: Oncology History  Malignant neoplasm of upper-outer quadrant of right breast in female, estrogen receptor positive (HCC)  07/01/2022 Initial Diagnosis   Right breast periareolar hardening and redness at 10 o'clock position measuring 1.7 cm with skin thickening extending to the nipple and an additional area 0.9 cm contiguous with the skin axilla, negative biopsy revealed grade 2 ILC ER 95% PR 20% Ki67 10%, HER2 negative 1+ by IHC.  Left breast calcifications retroareolar 9 o'clock position 2.6 cm biopsy was discordant excision recommended.   07/16/2022 Cancer Staging   Staging form: Breast, AJCC 8th Edition - Clinical: Stage IIIB (cT4, cN0, cM0, G2, ER+, PR+, HER2-) - Signed by Serena Croissant, MD on 07/16/2022 Stage prefix: Initial diagnosis Histologic grading system: 3 grade system   07/23/2022 Genetic Testing   Negative [facility] Multi-Cancer + RNA Panel. 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 Report date is July 23, 2022.  The Multi-Cancer + RNA Panel offered by [facility] includes sequencing and/or deletion/duplication analysis of the following 70 genes:  AIP*, ALK, APC*, ATM*, AXIN2*, BAP1*, BARD1*, BLM*, BMPR1A*, BRCA1*, BRCA2*, BRIP1*, CDC73*, CDH1*, CDK4, CDKN1B*, CDKN2A, CHEK2*, CTNNA1*, DICER1*, EPCAM (del/dup only), EGFR, FH*, FLCN*, GREM1 (promoter dup only), HOXB13, KIT, LZTR1, MAX*, MBD4, MEN1*, MET, MITF, MLH1*, MSH2*, MSH3*, MSH6*, MUTYH*, NF1*, NF2*, NTHL1*, PALB2*, PDGFRA, PMS2*, POLD1*, POLE*, POT1*, PRKAR1A*, PTCH1*, PTEN*, RAD51C*, RAD51D*, RB1*, RET,  SDHA* (sequencing only), SDHAF2*, SDHB*, SDHC*, SDHD*, SMAD4*, SMARCA4*, SMARCB1*, SMARCE1*, STK11*, SUFU*, TMEM127*, TP53*, TSC1*, TSC2*, VHL*. RNA analysis is performed for * genes.     CHIEF COMPLIANT: Verzinio with letrozole   INTERVAL HISTORY: Madison Richards is a 88 year old with above-mentioned history of right breast cancer. She presents to the clinic for a follow-up.    ALLERGIES:  is allergic to bacitracin-polymyxin b, fosamax [alendronate sodium], clindamycin/lincomycin, neosporin [neomycin-polymyxin-gramicidin], and penicillins.  MEDICATIONS:  Current Outpatient Medications  Medication Sig Dispense Refill   abemaciclib (VERZENIO) 50 MG tablet Take 1 tablet (50 mg total) by mouth 2 (two) times daily. 60 tablet 1   amLODipine-valsartan (EXFORGE) 5-160 MG tablet TAKE 1 TABLET BY MOUTH DAILY 90 tablet 1   Calcium Carbonate-Vitamin D (CALCIUM 600 + D PO) Take 1 tablet by mouth every other day.      Cholecalciferol (VITAMIN D3) 5000 UNITS TABS Take 1 tablet by mouth every Monday, Wednesday, and Friday.     clopidogrel (PLAVIX) 75 MG tablet TAKE 1 TABLET BY MOUTH EVERY DAY 90 tablet 1   Coenzyme Q10 (COQ10 PO) Take 200 mg by mouth.     colestipol (COLESTID) 1 g tablet Take 1 g by mouth daily. Take 2 pills daily     ezetimibe (ZETIA) 10 MG tablet Take 10 mg by mouth daily.     hydrochlorothiazide (MICROZIDE) 12.5 MG capsule TAKE 1 CAPSULE(12.5 MG) BY MOUTH DAILY 90 capsule 1   letrozole (FEMARA) 2.5 MG tablet Take 1 tablet (2.5 mg total) by mouth daily. 90 tablet 3   loratadine (CLARITIN) 10 MG tablet Take 10 mg by mouth daily as needed for allergies.      metoprolol tartrate (LOPRESSOR) 25 MG tablet TAKE 1 TABLET BY MOUTH TWICE A  DAY 180 tablet 1   Multiple Vitamin (MULTIVITAMIN WITH MINERALS) TABS Take 1 tablet by mouth daily.     pravastatin (PRAVACHOL) 40 MG tablet Take 1 tablet (40 mg total) by mouth at bedtime. 30 tablet 3   No current facility-administered medications for this  visit.    PHYSICAL EXAMINATION: ECOG PERFORMANCE STATUS: {CHL ONC ECOG [phone]}  There were no vitals filed for this visit. There were no vitals filed for this visit.  BREAST:*** No palpable masses or nodules in either right or left breasts. No palpable axillary supraclavicular or infraclavicular adenopathy no breast tenderness or nipple discharge. (exam performed in the presence of a chaperone)  LABORATORY DATA:  I have reviewed the data as listed    Latest Ref Rng & Units 08/27/2022    9:22 AM 07/16/2022   12:26 PM 02/08/2018    6:15 PM  CMP  Glucose 70 - 99 mg/dL 213  086  578   BUN 8 - 23 mg/dL 31  23  18    Creatinine 0.44 - 1.00 mg/dL 4.69  6.29  5.28   Sodium 135 - 145 mmol/L 139  141  137   Potassium 3.5 - 5.1 mmol/L 4.4  3.7  3.6   Chloride 98 - 111 mmol/L 103  104  107   CO2 22 - 32 mmol/L 29  32  21   Calcium 8.9 - 10.3 mg/dL 41.3  24.4  9.9   Total Protein 6.5 - 8.1 g/dL 6.9  6.8  7.1   Total Bilirubin 0.3 - 1.2 mg/dL 0.5  0.5  0.5   Alkaline Phos 38 - 126 U/L 107  74  57   AST 15 - 41 U/L 32  25  24   ALT 0 - 44 U/L 18  12  12      Lab Results  Component Value Date   WBC 5.3 08/27/2022   HGB 12.8 08/27/2022   HCT 37.9 08/27/2022   MCV 103.3 (H) 08/27/2022   PLT 191 08/27/2022   NEUTROABS 4.1 08/27/2022    ASSESSMENT & PLAN:  No problem-specific Assessment & Plan notes found for this encounter.    No orders of the defined types were placed in this encounter.  The patient has a good understanding of the overall plan. she agrees with it. she will call with any problems that may develop before the next visit here. Total time spent: 30 mins including face to face time and time spent for planning, charting and co-ordination of care   Sherlyn Lick, CMA 09/07/22    I Madison Richards am acting as a Neurosurgeon for [employer]  ***

## 2022-09-08 ENCOUNTER — Other Ambulatory Visit: Payer: Self-pay

## 2022-09-08 ENCOUNTER — Inpatient Hospital Stay: Payer: Medicare PPO

## 2022-09-08 ENCOUNTER — Inpatient Hospital Stay: Payer: Medicare PPO | Admitting: Hematology and Oncology

## 2022-09-08 VITALS — BP 111/54 | HR 94 | Temp 97.5°F | Resp 18 | Ht 63.0 in | Wt 90.9 lb

## 2022-09-08 DIAGNOSIS — C50411 Malignant neoplasm of upper-outer quadrant of right female breast: Secondary | ICD-10-CM | POA: Diagnosis not present

## 2022-09-08 DIAGNOSIS — Z170 Estrogen receptor positive status [ER+]: Secondary | ICD-10-CM

## 2022-09-08 DIAGNOSIS — C50919 Malignant neoplasm of unspecified site of unspecified female breast: Secondary | ICD-10-CM | POA: Diagnosis not present

## 2022-09-08 DIAGNOSIS — R5383 Other fatigue: Secondary | ICD-10-CM | POA: Diagnosis not present

## 2022-09-08 DIAGNOSIS — Z79811 Long term (current) use of aromatase inhibitors: Secondary | ICD-10-CM | POA: Diagnosis not present

## 2022-09-08 DIAGNOSIS — Z87891 Personal history of nicotine dependence: Secondary | ICD-10-CM | POA: Diagnosis not present

## 2022-09-08 DIAGNOSIS — R197 Diarrhea, unspecified: Secondary | ICD-10-CM | POA: Diagnosis not present

## 2022-09-08 LAB — CMP (CANCER CENTER ONLY)
ALT: 13 U/L (ref 0–44)
AST: 28 U/L (ref 15–41)
Albumin: 4 g/dL (ref 3.5–5.0)
Alkaline Phosphatase: 81 U/L (ref 38–126)
Anion gap: 6 (ref 5–15)
BUN: 18 mg/dL (ref 8–23)
CO2: 29 mmol/L (ref 22–32)
Calcium: 10.2 mg/dL (ref 8.9–10.3)
Chloride: 105 mmol/L (ref 98–111)
Creatinine: 0.92 mg/dL (ref 0.44–1.00)
GFR, Estimated: >60 mL/min (ref >60)
Glucose, Bld: 94 mg/dL (ref 70–99)
Potassium: 4.2 mmol/L (ref 3.5–5.1)
Sodium: 140 mmol/L (ref 135–145)
Total Bilirubin: 0.5 mg/dL (ref 0.3–1.2)
Total Protein: 6.6 g/dL (ref 6.5–8.1)

## 2022-09-08 LAB — CBC WITH DIFFERENTIAL (CANCER CENTER ONLY)
Abs Immature Granulocytes: 0.03 10*3/uL (ref 0.00–0.07)
Basophils Absolute: 0.1 10*3/uL (ref 0.0–0.1)
Basophils Relative: 1 %
Eosinophils Absolute: 0.1 10*3/uL (ref 0.0–0.5)
Eosinophils Relative: 1 %
HCT: 35.3 % — ABNORMAL LOW (ref 36.0–46.0)
Hemoglobin: 12.4 g/dL (ref 12.0–15.0)
Immature Granulocytes: 1 %
Lymphocytes Relative: 14 %
Lymphs Abs: 0.7 10*3/uL (ref 0.7–4.0)
MCH: 36.2 pg — ABNORMAL HIGH (ref 26.0–34.0)
MCHC: 35.1 g/dL (ref 30.0–36.0)
MCV: 102.9 fL — ABNORMAL HIGH (ref 80.0–100.0)
Monocytes Absolute: 0.5 10*3/uL (ref 0.1–1.0)
Monocytes Relative: 9 %
Neutro Abs: 4.1 10*3/uL (ref 1.7–7.7)
Neutrophils Relative %: 74 %
Platelet Count: 260 10*3/uL (ref 150–400)
RBC: 3.43 MIL/uL — ABNORMAL LOW (ref 3.87–5.11)
RDW: 17 % — ABNORMAL HIGH (ref 11.5–15.5)
WBC Count: 5.4 10*3/uL (ref 4.0–10.5)
nRBC: 0 % (ref 0.0–0.2)

## 2022-09-08 MED ORDER — DENOSUMAB 120 MG/1.7ML ~~LOC~~ SOLN
120.0000 mg | Freq: Once | SUBCUTANEOUS | Status: AC
  Administered 2022-09-08: 120 mg via SUBCUTANEOUS
  Filled 2022-09-08: qty 1.7

## 2022-09-08 NOTE — Assessment & Plan Note (Addendum)
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  Left breast calcifications retroareolar 9 o'clock position 2.6 cm biopsy was discordant excision recommended.    07/28/2022: CT CAP: Widespread bone metastases throughout the visualized bone structures, 6 mm right upper lobe pulm nodule, bilateral adrenal adenomas, 2.7 cm aortic aneurysm, emphysema 07/28/2022: Bone scan: Diffuse osseous metastatic disease ------------------------------------------------------------------------------------------------------------------------------------------------ Current treatment: Verzinio (started 08/06/2022) with letrozole and Zometa every 3 months Verzinio toxicities: Very occasional diarrhea Slight fatigue Overall she tolerates Verzinio fairly well. She thinks that this tumor on the breast is shrinking significantly.  We decided to keep the dosage the same.  Return to clinic in 1 month for follow-up with Jonny Ruiz. I ordered CT scans and I will follow the patient up in 2 months.

## 2022-09-09 ENCOUNTER — Telehealth: Payer: Self-pay | Admitting: Hematology and Oncology

## 2022-09-09 LAB — CANCER ANTIGEN 15-3: CA 15-3: 63 U/mL — ABNORMAL HIGH (ref 0.0–25.0)

## 2022-09-09 LAB — CANCER ANTIGEN 27.29: CA 27.29: 72 U/mL — ABNORMAL HIGH (ref 0.0–38.6)

## 2022-09-09 NOTE — Telephone Encounter (Signed)
Rescheduled appointments per los. Patient is aware of the changes made to her upcoming appointments.

## 2022-09-10 ENCOUNTER — Other Ambulatory Visit: Payer: Self-pay

## 2022-09-10 ENCOUNTER — Telehealth: Payer: Self-pay

## 2022-09-10 MED ORDER — METOPROLOL TARTRATE 25 MG PO TABS: 25.0000 mg | ORAL_TABLET | Freq: Two times a day (BID) | ORAL | 1 refills | Status: DC

## 2022-09-10 NOTE — Telephone Encounter (Signed)
Returned Pt's call regarding bilateral ankle swelling. Pt reports ankle swelling starting the day after receiving Xgeva. Pt denies pitting edema, pain, or numbness. Per Pt, swelling decreases with rest and elevation. Advised Pt to continue to monitor and to call back for worsening swelling that does not respond to rest/elevation. Pt verbalized understanding.

## 2022-09-22 ENCOUNTER — Other Ambulatory Visit: Payer: Medicare PPO

## 2022-09-22 ENCOUNTER — Ambulatory Visit: Payer: Medicare PPO | Admitting: Pharmacist

## 2022-09-23 ENCOUNTER — Other Ambulatory Visit (HOSPITAL_COMMUNITY): Payer: Self-pay

## 2022-09-23 ENCOUNTER — Other Ambulatory Visit: Payer: Self-pay | Admitting: Hematology and Oncology

## 2022-09-23 ENCOUNTER — Other Ambulatory Visit: Payer: Self-pay

## 2022-09-23 MED ORDER — ABEMACICLIB 50 MG PO TABS
50.0000 mg | ORAL_TABLET | Freq: Two times a day (BID) | ORAL | 1 refills | Status: DC
  Filled 2022-09-23: qty 56, 28d supply, fill #0
  Filled 2022-10-22: qty 56, 28d supply, fill #1

## 2022-09-25 ENCOUNTER — Other Ambulatory Visit (HOSPITAL_COMMUNITY): Payer: Self-pay

## 2022-09-29 ENCOUNTER — Other Ambulatory Visit (HOSPITAL_COMMUNITY): Payer: Self-pay

## 2022-10-01 ENCOUNTER — Other Ambulatory Visit (HOSPITAL_COMMUNITY): Payer: Self-pay

## 2022-10-06 ENCOUNTER — Inpatient Hospital Stay: Payer: Medicare PPO | Attending: Hematology and Oncology

## 2022-10-06 ENCOUNTER — Inpatient Hospital Stay: Payer: Medicare PPO | Admitting: Pharmacist

## 2022-10-06 VITALS — BP 103/58 | HR 65 | Temp 97.3°F | Resp 18 | Ht 63.0 in | Wt 88.4 lb

## 2022-10-06 DIAGNOSIS — Z170 Estrogen receptor positive status [ER+]: Secondary | ICD-10-CM | POA: Diagnosis not present

## 2022-10-06 DIAGNOSIS — C50919 Malignant neoplasm of unspecified site of unspecified female breast: Secondary | ICD-10-CM

## 2022-10-06 DIAGNOSIS — C50411 Malignant neoplasm of upper-outer quadrant of right female breast: Secondary | ICD-10-CM | POA: Diagnosis not present

## 2022-10-06 DIAGNOSIS — Z79811 Long term (current) use of aromatase inhibitors: Secondary | ICD-10-CM | POA: Diagnosis not present

## 2022-10-06 LAB — CMP (CANCER CENTER ONLY)
ALT: 13 U/L (ref 0–44)
AST: 27 U/L (ref 15–41)
Albumin: 3.9 g/dL (ref 3.5–5.0)
Alkaline Phosphatase: 73 U/L (ref 38–126)
Anion gap: 7 (ref 5–15)
BUN: 24 mg/dL — ABNORMAL HIGH (ref 8–23)
CO2: 28 mmol/L (ref 22–32)
Calcium: 10 mg/dL (ref 8.9–10.3)
Chloride: 105 mmol/L (ref 98–111)
Creatinine: 1.03 mg/dL — ABNORMAL HIGH (ref 0.44–1.00)
GFR, Estimated: 53 mL/min — ABNORMAL LOW (ref >60)
Glucose, Bld: 95 mg/dL (ref 70–99)
Potassium: 4.1 mmol/L (ref 3.5–5.1)
Sodium: 140 mmol/L (ref 135–145)
Total Bilirubin: 0.5 mg/dL (ref 0.3–1.2)
Total Protein: 6.6 g/dL (ref 6.5–8.1)

## 2022-10-06 LAB — CBC WITH DIFFERENTIAL (CANCER CENTER ONLY)
Abs Immature Granulocytes: 0.03 10*3/uL (ref 0.00–0.07)
Basophils Absolute: 0.1 10*3/uL (ref 0.0–0.1)
Basophils Relative: 2 %
Eosinophils Absolute: 0.1 10*3/uL (ref 0.0–0.5)
Eosinophils Relative: 1 %
HCT: 36.8 % (ref 36.0–46.0)
Hemoglobin: 12.3 g/dL (ref 12.0–15.0)
Immature Granulocytes: 1 %
Lymphocytes Relative: 14 %
Lymphs Abs: 0.8 10*3/uL (ref 0.7–4.0)
MCH: 36 pg — ABNORMAL HIGH (ref 26.0–34.0)
MCHC: 33.4 g/dL (ref 30.0–36.0)
MCV: 107.6 fL — ABNORMAL HIGH (ref 80.0–100.0)
Monocytes Absolute: 0.5 10*3/uL (ref 0.1–1.0)
Monocytes Relative: 9 %
Neutro Abs: 4.3 10*3/uL (ref 1.7–7.7)
Neutrophils Relative %: 73 %
Platelet Count: 236 10*3/uL (ref 150–400)
RBC: 3.42 MIL/uL — ABNORMAL LOW (ref 3.87–5.11)
RDW: 18.1 % — ABNORMAL HIGH (ref 11.5–15.5)
WBC Count: 5.8 10*3/uL (ref 4.0–10.5)
nRBC: 0 % (ref 0.0–0.2)

## 2022-10-06 NOTE — Progress Notes (Signed)
Madison Richards       Telephone: [phone]?Fax: [phone]   Oncology Clinical Pharmacist Practitioner Progress Note  Madison Richards is a 88 y.o. female with a diagnosis of breast cancer. They were contacted today via in-person visit. She is accompanied by her friend Madison Richards.   Current treatment regimen and start date Abemaciclib (08/09/22) Letrozole (08/06/22) Denosumab 120 mg (09/08/22)  Interval History She continues on abemaciclib 50 mg by mouth every 12 hours on days 1 to 28 of a 28-day cycle. This is being given in combination with letrozole and denosumab 120 mg . Therapy is planned to continue until disease progression or unacceptable toxicity.  Madison Richards was seen today by clinical pharmacy as a follow-up to her abemaciclib management.  She was last seen by clinical pharmacy on 08/27/22 and Dr. Pamelia Richards on 09/08/22.  She reports some peripheral edema which usually starts in the afternoon to early evening.  We did discuss that denosumab 120 mg has been incidence of about 17% in the literature.  She states that the peripheral edema is not uncomfortable and only notices it if she looks down at her feet.  She does not feel that the edema is getting any worse and she is having no issues with breathing.  It is mainly located in her ankles bilaterally.  She does take hydrochlorothiazide under the care of Dr. Jacinto Richards as well as amlodipine-valsartan.  She next sees him on 01/26/23.  We did discuss that if the peripheral edema continues for affects her quality of life, Dr. Jacinto Richards may consider adjusting her hydrochlorothiazide dose.   Response to Therapy Overall, Madison Richards is doing well.  Her serum creatinine is slightly elevated today, but overall remains relatively stable.  Her tumor markers that were ordered by Dr. Pamelia Richards will likely result tomorrow and go directly to him.  They are trending down.  She is having no issues with adherence and has not changed her medication list since her last  visit.  She does report having 2 loose stools since her last visit.  She says when this occurs, she takes 1 loperamide tablet which alleviates his symptoms.  Dr. Pamelia Richards has put restaging scans in for 10/28/22.  These are not scheduled yet but we did give Madison Richards the scheduling department's phone number in case she does not hear from them.  We will add labs and a Dr. Pamelia Richards visit for 11/03/22 and he will review scans at this time.  We will then see her back with labs when she is next due for denosumab 120 mg Madison Richards) which is on 12/01/22.  Labs, vitals, treatment parameters, and manufacturer guidelines assessing toxicity were reviewed with Madison Richards today. Based on these values, patient is in agreement to continue abemaciclib therapy at this time.  Allergies Allergies  Allergen Reactions   Bacitracin-Polymyxin B     Other reaction(s): AREA APPLIED TO SPREAD   Fosamax [Alendronate Sodium]     Leg cramps   Clindamycin/Lincomycin Rash    Rash in mouth   Neosporin [Neomycin-Polymyxin-Gramicidin] Rash   Penicillins Rash    Rash in mouth    Vitals    10/06/2022   11:03 AM 09/08/2022   10:28 AM 08/27/2022   10:04 AM  Oncology Vitals  Height 160 cm 160 cm 160 cm  Weight 40.098 kg 41.232 kg 40.824 kg  Weight (lbs) 88 lbs 6 oz 90 lbs 14 oz 90 lbs  BMI 15.66 kg/m2 16.1 kg/m2 15.94 kg/m2  Temp 97.3  F (36.3 C) 97.5 F (36.4 C) 97.2 F (36.2 C)  Pulse Rate 65 94 71  BP 103/58 111/54 104/60  Resp 18 18 18   SpO2 100 % 99 % 100 %  BSA (m2) 1.34 m2 1.35 m2 1.35 m2    Laboratory Data    Latest Ref Rng & Units 10/06/2022   10:28 AM 09/08/2022   10:11 AM 08/27/2022    9:22 AM  CBC EXTENDED  WBC 4.0 - 10.5 K/uL 5.8  5.4  5.3   RBC 3.87 - 5.11 MIL/uL 3.42  3.43  3.67   Hemoglobin 12.0 - 15.0 g/dL 16.1  09.6  04.5   HCT 36.0 - 46.0 % 36.8  35.3  37.9   Platelets 150 - 400 K/uL 236  260  191   NEUT# 1.7 - 7.7 K/uL 4.3  4.1  4.1   Lymph# 0.7 - 4.0 K/uL 0.8  0.7  0.7        Latest Ref Rng  & Units 10/06/2022   10:28 AM 09/08/2022   10:11 AM 08/27/2022    9:22 AM  CMP  Glucose 70 - 99 mg/dL 95  94  409   BUN 8 - 23 mg/dL 24  18  31    Creatinine 0.44 - 1.00 mg/dL 8.11  9.14  7.82   Sodium 135 - 145 mmol/L 140  140  139   Potassium 3.5 - 5.1 mmol/L 4.1  4.2  4.4   Chloride 98 - 111 mmol/L 105  105  103   CO2 22 - 32 mmol/L 28  29  29    Calcium 8.9 - 10.3 mg/dL 95.6  21.3  08.6   Total Protein 6.5 - 8.1 g/dL 6.6  6.6  6.9   Total Bilirubin 0.3 - 1.2 mg/dL 0.5  0.5  0.5   Alkaline Phos 38 - 126 U/L 73  81  107   AST 15 - 41 U/L 27  28  32   ALT 0 - 44 U/L 13  13  18      No results found for: "MG" Lab Results  Component Value Date   CA2729 72.0 (H) 09/08/2022   CA2729 78.0 (H) 08/27/2022     Adverse Effects Assessment Serum creatinine: Slightly elevated above normal but overall stable. Diarrhea: Very mild and controlled with 1 tablet of loperamide Peripheral edema: Mainly located in the ankles and started soon after her first injection of denosumab.  This has not incidence of about 17% in the literature but she does not feel this is affecting her quality of life and she only notices if she takes down.  She does not feel that the edema is worsening and we did discuss to continue to watch for VTE symptoms which at times can be similar  Adherence Assessment Madison Richards reports missing 0 doses over the past 4 weeks.   Reason for missed dose: N/A Patient was re-educated on importance of adherence.   Access Assessment Madison Richards is currently receiving her abemaciclib through [facility] concerns: None  Medication Reconciliation The patient's medication list was reviewed today with the patient?  Yes New medications or herbal supplements have recently been started?  No Any medications have been discontinued?  No The medication list was updated and reconciled based on the patient's most recent medication list in the electronic medical  record (EMR) including herbal products and OTC medications.   Medications Current Outpatient Medications  Medication Sig Dispense Refill   abemaciclib (VERZENIO)  50 MG tablet Take 1 tablet (50 mg total) by mouth 2 (two) times daily. 60 tablet 1   amLODipine-valsartan (EXFORGE) 5-160 MG tablet TAKE 1 TABLET BY MOUTH DAILY 90 tablet 1   Calcium Carbonate-Vitamin D (CALCIUM 600 + D PO) Take 1 tablet by mouth every other day.      Cholecalciferol (VITAMIN D3) 5000 UNITS TABS Take 1 tablet by mouth every Monday, Wednesday, and Friday.     clopidogrel (PLAVIX) 75 MG tablet TAKE 1 TABLET BY MOUTH EVERY DAY 90 tablet 1   Coenzyme Q10 (COQ10 PO) Take 200 mg by mouth.     colestipol (COLESTID) 1 g tablet Take 1 g by mouth daily. Take 2 pills daily     ezetimibe (ZETIA) 10 MG tablet Take 10 mg by mouth daily.     hydrochlorothiazide (MICROZIDE) 12.5 MG capsule TAKE 1 CAPSULE(12.5 MG) BY MOUTH DAILY 90 capsule 1   letrozole (FEMARA) 2.5 MG tablet Take 1 tablet (2.5 mg total) by mouth daily. 90 tablet 3   loratadine (CLARITIN) 10 MG tablet Take 10 mg by mouth daily as needed for allergies.      metoprolol tartrate (LOPRESSOR) 25 MG tablet Take 1 tablet (25 mg total) by mouth 2 (two) times daily. 180 tablet 1   Multiple Vitamin (MULTIVITAMIN WITH MINERALS) TABS Take 1 tablet by mouth daily.     pravastatin (PRAVACHOL) 40 MG tablet Take 1 tablet (40 mg total) by mouth at bedtime. 30 tablet 3   No current facility-administered medications for this visit.   Drug-Drug Interactions (DDIs) DDIs were evaluated?  Yes Significant DDIs?  No The patient was instructed to speak with their health care provider and/or the oral chemotherapy pharmacist before starting any new drug, including prescription or over the counter, natural / herbal products, or vitamins.  Supportive Care Diarrhea: we reviewed that diarrhea is common with abemaciclib and confirmed that she does have loperamide (Imodium) at home.  We reviewed  how to take this medication PRN. Neutropenia: we discussed the importance of having a thermometer and what the Centers for Disease Control and Prevention (CDC) considers a fever which is 100.65F (38C) or higher.  Gave patient 24/7 triage line to call if any fevers or symptoms. ILD/Pneumonitis: we reviewed potential symptoms including cough, shortness, and fatigue.  VTE: reviewed signs of DVT such as leg swelling, redness, pain, or tenderness and signs of PE such as shortness of breath, rapid or irregular heartbeat, cough, chest pain, or lightheadedness. Reviewed to take the medication every 12 hours (with food sometimes can be easier on the stomach) and to take it at the same time every day. Hepatotoxicity: WNL Drug interactions with grapefruit products Monitor peripheral edema which may be from denosumab.   Dosing Assessment Hepatic adjustments needed?  No Renal adjustments needed?  No Toxicity adjustments needed?  No The current dosing regimen is appropriate to continue at this time.  Follow-Up Plan Continue abemaciclib 50 mg every 12 hours by mouth Continue letrozole 2.5 mg by mouth daily Continue denosumab 120 mg every 12 weeks. Next due 12/01/22 Monitor peripheral edema and serum creatinine Use loperamide as needed for diarrhea Continue to follow with Dr. Jacinto Richards for hypertension. May adjust hydrochlorothiazide if peripheral edema continues. Next visit 01/26/23. Appreciate his recs Will add labs, Dr. Pamelia Richards visit for 11/03/22. Restaging scans ordered for 10/28/22. Not scheduled yet Will add labs, pharmacy clinic, denosumab 120 mg for 12/01/22  Madison Richards participated in the discussion, expressed understanding, and voiced agreement with the  above plan. All questions were answered to her satisfaction. The patient was advised to contact the clinic at [phone] with any questions or concerns prior to her return visit.   I spent 30 minutes assessing and educating the patient.  Eufemia Prindle  A. Odetta Pink, PharmD, BCOP, CPP  Anselm Lis, RPH-CPP, 10/06/2022  11:18 AM   **Disclaimer: This note was dictated with voice recognition software. Similar sounding words can inadvertently be transcribed and this note may contain transcription errors which may not have been corrected upon publication of note.**

## 2022-10-07 LAB — CANCER ANTIGEN 27.29: CA 27.29: 64.3 U/mL — ABNORMAL HIGH (ref 0.0–38.6)

## 2022-10-07 LAB — CANCER ANTIGEN 15-3: CA 15-3: 57.7 U/mL — ABNORMAL HIGH (ref 0.0–25.0)

## 2022-10-08 ENCOUNTER — Telehealth: Payer: Self-pay | Admitting: Pharmacist

## 2022-10-08 ENCOUNTER — Inpatient Hospital Stay: Payer: Medicare PPO | Admitting: Pharmacist

## 2022-10-08 NOTE — Telephone Encounter (Signed)
err

## 2022-10-08 NOTE — Progress Notes (Signed)
[facility]       Telephone: [phone]?Fax: [phone]   Oncology Clinical Pharmacist Practitioner Progress Note  Madison Richards is a 88 y.o. female with a diagnosis of metastatic breast cance currently on abemaciclib + letrozole + denosumab 120 mg (Xgeva) under the care of Dr. Serena Croissant.   I connected with Madison Richards today by telephone and verified that I was speaking with the correct person using two patient identifiers. I discussed the limitations, risks, security and privacy concerns of performing an evaluation and management service by telemedicine and the availability of in-person appointments. The patient/caregiver expressed understanding and agreed to proceed.  Other persons participating in the visit and their role in the encounter: none   Patient's location: home  Provider's location: clinic  Clinical pharmacy received a voicemail from [name] inquiring about her calcium/vitamin D supplement. We contacted her back and she confirmed that she takes 1 tablet twice daily. We went ahead and updated her medication list to reflect this change per her instruction. As discussed at her past visit on 10/06/22:  - She will have labs, Dr. Pamelia Hoit visit on 11/03/22. He will likely review scans at this time which are ordered for 10/28/22   - She will have labs, pharmacy clinic visit, denosumab 120 mg [name]) on 12/01/22   Madison Richards participated in the discussion, expressed understanding, and voiced agreement with the above plan. All questions were answered to her satisfaction. The patient was advised to contact the clinic at [phone] with any questions or concerns prior to her return visit.  Clinical pharmacy will continue to support Madison Richards and Dr. Serena Croissant as needed.  Tamela Elsayed A. Odetta Pink, PharmD, BCOP, CPP  Anselm Lis, RPH-CPP,  10/08/2022  11:17 AM   **Disclaimer: This note was dictated with voice recognition software. Similar sounding  words can inadvertently be transcribed and this note may contain transcription errors which may not have been corrected upon publication of note.**

## 2022-10-08 NOTE — Telephone Encounter (Signed)
Patient is aware of scheduled appointment times/dates

## 2022-10-22 ENCOUNTER — Other Ambulatory Visit (HOSPITAL_COMMUNITY): Payer: Self-pay

## 2022-10-24 DIAGNOSIS — Z0000 Encounter for general adult medical examination without abnormal findings: Secondary | ICD-10-CM | POA: Diagnosis not present

## 2022-10-24 DIAGNOSIS — Z1389 Encounter for screening for other disorder: Secondary | ICD-10-CM | POA: Diagnosis not present

## 2022-10-24 DIAGNOSIS — Z23 Encounter for immunization: Secondary | ICD-10-CM | POA: Diagnosis not present

## 2022-10-24 DIAGNOSIS — Z681 Body mass index (BMI) 19 or less, adult: Secondary | ICD-10-CM | POA: Diagnosis not present

## 2022-10-28 ENCOUNTER — Ambulatory Visit (HOSPITAL_COMMUNITY)
Admission: RE | Admit: 2022-10-28 | Discharge: 2022-10-28 | Disposition: A | Discharge status: Home / Self Care | Payer: Medicare PPO | Source: Ambulatory Visit | Attending: Hematology and Oncology | Admitting: Hematology and Oncology

## 2022-10-28 ENCOUNTER — Encounter (HOSPITAL_COMMUNITY): Payer: Self-pay

## 2022-10-28 DIAGNOSIS — C50919 Malignant neoplasm of unspecified site of unspecified female breast: Secondary | ICD-10-CM | POA: Diagnosis not present

## 2022-10-28 DIAGNOSIS — R935 Abnormal findings on diagnostic imaging of other abdominal regions, including retroperitoneum: Secondary | ICD-10-CM | POA: Diagnosis not present

## 2022-10-28 DIAGNOSIS — Z170 Estrogen receptor positive status [ER+]: Secondary | ICD-10-CM | POA: Insufficient documentation

## 2022-10-28 DIAGNOSIS — C50411 Malignant neoplasm of upper-outer quadrant of right female breast: Secondary | ICD-10-CM | POA: Diagnosis not present

## 2022-10-28 DIAGNOSIS — C7951 Secondary malignant neoplasm of bone: Secondary | ICD-10-CM | POA: Diagnosis not present

## 2022-10-28 DIAGNOSIS — K760 Fatty (change of) liver, not elsewhere classified: Secondary | ICD-10-CM | POA: Diagnosis not present

## 2022-10-28 MED ORDER — IOHEXOL 300 MG/ML  SOLN
80.0000 mL | Freq: Once | INTRAMUSCULAR | Status: AC | PRN
  Administered 2022-10-28: 80 mL via INTRAVENOUS

## 2022-10-28 MED ORDER — SODIUM CHLORIDE (PF) 0.9 % IJ SOLN
INTRAMUSCULAR | Status: AC
  Filled 2022-10-28: qty 50

## 2022-10-29 ENCOUNTER — Other Ambulatory Visit (HOSPITAL_COMMUNITY): Payer: Self-pay

## 2022-11-04 ENCOUNTER — Inpatient Hospital Stay: Payer: Medicare PPO | Admitting: Hematology and Oncology

## 2022-11-04 ENCOUNTER — Inpatient Hospital Stay: Payer: Medicare PPO | Attending: Hematology and Oncology

## 2022-11-04 VITALS — BP 119/42 | HR 78 | Temp 97.3°F | Resp 18 | Ht 63.0 in | Wt 91.1 lb

## 2022-11-04 DIAGNOSIS — Z23 Encounter for immunization: Secondary | ICD-10-CM | POA: Diagnosis not present

## 2022-11-04 DIAGNOSIS — C50411 Malignant neoplasm of upper-outer quadrant of right female breast: Secondary | ICD-10-CM | POA: Insufficient documentation

## 2022-11-04 DIAGNOSIS — K760 Fatty (change of) liver, not elsewhere classified: Secondary | ICD-10-CM | POA: Diagnosis not present

## 2022-11-04 DIAGNOSIS — Z170 Estrogen receptor positive status [ER+]: Secondary | ICD-10-CM | POA: Insufficient documentation

## 2022-11-04 DIAGNOSIS — R197 Diarrhea, unspecified: Secondary | ICD-10-CM | POA: Diagnosis not present

## 2022-11-04 DIAGNOSIS — D3501 Benign neoplasm of right adrenal gland: Secondary | ICD-10-CM | POA: Insufficient documentation

## 2022-11-04 DIAGNOSIS — Z880 Allergy status to penicillin: Secondary | ICD-10-CM | POA: Diagnosis not present

## 2022-11-04 DIAGNOSIS — Z79811 Long term (current) use of aromatase inhibitors: Secondary | ICD-10-CM | POA: Diagnosis not present

## 2022-11-04 DIAGNOSIS — C7951 Secondary malignant neoplasm of bone: Secondary | ICD-10-CM | POA: Diagnosis not present

## 2022-11-04 DIAGNOSIS — J439 Emphysema, unspecified: Secondary | ICD-10-CM | POA: Diagnosis not present

## 2022-11-04 DIAGNOSIS — Z881 Allergy status to other antibiotic agents status: Secondary | ICD-10-CM | POA: Diagnosis not present

## 2022-11-04 DIAGNOSIS — I739 Peripheral vascular disease, unspecified: Secondary | ICD-10-CM | POA: Diagnosis not present

## 2022-11-04 DIAGNOSIS — Z79899 Other long term (current) drug therapy: Secondary | ICD-10-CM | POA: Diagnosis not present

## 2022-11-04 DIAGNOSIS — Z7902 Long term (current) use of antithrombotics/antiplatelets: Secondary | ICD-10-CM | POA: Diagnosis not present

## 2022-11-04 DIAGNOSIS — R5383 Other fatigue: Secondary | ICD-10-CM | POA: Diagnosis not present

## 2022-11-04 DIAGNOSIS — E46 Unspecified protein-calorie malnutrition: Secondary | ICD-10-CM | POA: Diagnosis not present

## 2022-11-04 DIAGNOSIS — I700 Atherosclerosis of aorta: Secondary | ICD-10-CM | POA: Diagnosis not present

## 2022-11-04 DIAGNOSIS — D3502 Benign neoplasm of left adrenal gland: Secondary | ICD-10-CM | POA: Insufficient documentation

## 2022-11-04 DIAGNOSIS — C50919 Malignant neoplasm of unspecified site of unspecified female breast: Secondary | ICD-10-CM

## 2022-11-04 DIAGNOSIS — E7800 Pure hypercholesterolemia, unspecified: Secondary | ICD-10-CM | POA: Diagnosis not present

## 2022-11-04 DIAGNOSIS — I7143 Infrarenal abdominal aortic aneurysm, without rupture: Secondary | ICD-10-CM | POA: Diagnosis not present

## 2022-11-04 LAB — CBC WITH DIFFERENTIAL (CANCER CENTER ONLY)
Abs Immature Granulocytes: 0.03 10*3/uL (ref 0.00–0.07)
Basophils Absolute: 0.1 10*3/uL (ref 0.0–0.1)
Basophils Relative: 2 %
Eosinophils Absolute: 0.1 10*3/uL (ref 0.0–0.5)
Eosinophils Relative: 1 %
HCT: 33.5 % — ABNORMAL LOW (ref 36.0–46.0)
Hemoglobin: 11.6 g/dL — ABNORMAL LOW (ref 12.0–15.0)
Immature Granulocytes: 1 %
Lymphocytes Relative: 18 %
Lymphs Abs: 1 10*3/uL (ref 0.7–4.0)
MCH: 37.5 pg — ABNORMAL HIGH (ref 26.0–34.0)
MCHC: 34.6 g/dL (ref 30.0–36.0)
MCV: 108.4 fL — ABNORMAL HIGH (ref 80.0–100.0)
Monocytes Absolute: 0.7 10*3/uL (ref 0.1–1.0)
Monocytes Relative: 13 %
Neutro Abs: 3.6 10*3/uL (ref 1.7–7.7)
Neutrophils Relative %: 65 %
Platelet Count: 225 10*3/uL (ref 150–400)
RBC: 3.09 MIL/uL — ABNORMAL LOW (ref 3.87–5.11)
RDW: 17.3 % — ABNORMAL HIGH (ref 11.5–15.5)
WBC Count: 5.5 10*3/uL (ref 4.0–10.5)
nRBC: 0 % (ref 0.0–0.2)

## 2022-11-04 LAB — CMP (CANCER CENTER ONLY)
ALT: 17 U/L (ref 0–44)
AST: 26 U/L (ref 15–41)
Albumin: 3.9 g/dL (ref 3.5–5.0)
Alkaline Phosphatase: 113 U/L (ref 38–126)
Anion gap: 6 (ref 5–15)
BUN: 24 mg/dL — ABNORMAL HIGH (ref 8–23)
CO2: 32 mmol/L (ref 22–32)
Calcium: 10.1 mg/dL (ref 8.9–10.3)
Chloride: 105 mmol/L (ref 98–111)
Creatinine: 0.91 mg/dL (ref 0.44–1.00)
GFR, Estimated: >60 mL/min (ref >60)
Glucose, Bld: 85 mg/dL (ref 70–99)
Potassium: 3.7 mmol/L (ref 3.5–5.1)
Sodium: 143 mmol/L (ref 135–145)
Total Bilirubin: 0.4 mg/dL (ref 0.3–1.2)
Total Protein: 6.9 g/dL (ref 6.5–8.1)

## 2022-11-04 NOTE — Progress Notes (Signed)
Patient Care Team: Sigmund Hazel, MD as PCP - General (Family Medicine) Serena Croissant, MD as Consulting Physician (Hematology and Oncology) Dorothy Puffer, MD as Consulting Physician (Radiation Oncology) [name], MD as Consulting Physician (General Surgery) Pershing Proud, RN as Oncology Nurse Navigator Donnelly Angelica, RN as Oncology Nurse Navigator Anselm Lis, RPH-CPP as Pharmacist (Hematology and Oncology)  DIAGNOSIS:  Encounter Diagnosis  Name Primary?   Malignant neoplasm of upper-outer quadrant of right breast in female, estrogen receptor positive (HCC) Yes    SUMMARY OF ONCOLOGIC HISTORY: Oncology History  Malignant neoplasm of upper-outer quadrant of right breast in female, estrogen receptor positive (HCC)  07/01/2022 Initial Diagnosis   Right breast periareolar hardening and redness at 10 o'clock position measuring 1.7 cm with skin thickening extending to the nipple and an additional area 0.9 cm contiguous with the skin axilla, negative biopsy revealed grade 2 ILC ER 95% PR 20% Ki67 10%, HER2 negative 1+ by IHC.  Left breast calcifications retroareolar 9 o'clock position 2.6 cm biopsy was discordant excision recommended.   07/16/2022 Cancer Staging   Staging form: Breast, AJCC 8th Edition - Clinical: Stage IIIB (cT4, cN0, cM0, G2, ER+, PR+, HER2-) - Signed by Serena Croissant, MD on 07/16/2022 Stage prefix: Initial diagnosis Histologic grading system: 3 grade system   07/23/2022 Genetic Testing   Negative [facility] Multi-Cancer + RNA Panel. 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 Report date is July 23, 2022.  The Multi-Cancer + RNA Panel offered by [facility] includes sequencing and/or deletion/duplication analysis of the following 70 genes:  AIP*, ALK, APC*, ATM*, AXIN2*, BAP1*, BARD1*, BLM*, BMPR1A*, BRCA1*, BRCA2*, BRIP1*, CDC73*, CDH1*, CDK4, CDKN1B*, CDKN2A, CHEK2*, CTNNA1*, DICER1*, EPCAM (del/dup only), EGFR, FH*, FLCN*, GREM1 (promoter dup only), HOXB13,  KIT, LZTR1, MAX*, MBD4, MEN1*, MET, MITF, MLH1*, MSH2*, MSH3*, MSH6*, MUTYH*, NF1*, NF2*, NTHL1*, PALB2*, PDGFRA, PMS2*, POLD1*, POLE*, POT1*, PRKAR1A*, PTCH1*, PTEN*, RAD51C*, RAD51D*, RB1*, RET, SDHA* (sequencing only), SDHAF2*, SDHB*, SDHC*, SDHD*, SMAD4*, SMARCA4*, SMARCB1*, SMARCE1*, STK11*, SUFU*, TMEM127*, TP53*, TSC1*, TSC2*, VHL*. RNA analysis is performed for * genes.     CHIEF COMPLIANT:   Discussed the use of AI scribe software for clinical note transcription with the patient, who gave verbal consent to proceed.  History of Present Illness   The patient, with a known history of cancer with bone involvement, presents for a follow-up visit after a recent CT scan. The patient reports feeling cold all the time and experiencing significant fatigue, which she attributes to their current medication, Verzenio. They also mention having had bouts of diarrhea on two different occasions since starting the medication. The patient denies any other symptoms or discomfort, including any symptoms related to the findings of haziness in the pelvis and a thickened bladder wall noted on the CT scan. The patient also received a pneumonia vaccine recently and is planning to get a COVID-19 booster and an RSV vaccine.         ALLERGIES:  is allergic to bacitracin-polymyxin b, fosamax [alendronate sodium], clindamycin/lincomycin, neosporin [neomycin-polymyxin-gramicidin], and penicillins.  MEDICATIONS:  Current Outpatient Medications  Medication Sig Dispense Refill   abemaciclib (VERZENIO) 50 MG tablet Take 1 tablet (50 mg total) by mouth 2 (two) times daily. 60 tablet 1   amLODipine-valsartan (EXFORGE) 5-160 MG tablet TAKE 1 TABLET BY MOUTH DAILY 90 tablet 1   Calcium Carbonate-Vitamin D (CALCIUM 600 + D PO) Take 1 tablet by mouth in the morning and at bedtime.     Cholecalciferol (VITAMIN D3) 5000 UNITS TABS Take  1 tablet by mouth every Monday, Wednesday, and Friday.     clopidogrel (PLAVIX) 75 MG tablet  TAKE 1 TABLET BY MOUTH EVERY DAY 90 tablet 1   Coenzyme Q10 (COQ10 PO) Take 200 mg by mouth.     colestipol (COLESTID) 1 g tablet Take 1 g by mouth daily. Take 2 pills daily     ezetimibe (ZETIA) 10 MG tablet Take 10 mg by mouth daily.     hydrochlorothiazide (MICROZIDE) 12.5 MG capsule TAKE 1 CAPSULE(12.5 MG) BY MOUTH DAILY 90 capsule 1   letrozole (FEMARA) 2.5 MG tablet Take 1 tablet (2.5 mg total) by mouth daily. 90 tablet 3   loratadine (CLARITIN) 10 MG tablet Take 10 mg by mouth daily as needed for allergies.      metoprolol tartrate (LOPRESSOR) 25 MG tablet Take 1 tablet (25 mg total) by mouth 2 (two) times daily. 180 tablet 1   Multiple Vitamin (MULTIVITAMIN WITH MINERALS) TABS Take 1 tablet by mouth daily.     pravastatin (PRAVACHOL) 40 MG tablet Take 1 tablet (40 mg total) by mouth at bedtime. 30 tablet 3   No current facility-administered medications for this visit.    PHYSICAL EXAMINATION: ECOG PERFORMANCE STATUS: 1 - Symptomatic but completely ambulatory  Vitals:   11/04/22 1518  BP: (!) 119/42  Pulse: 78  Resp: 18  Temp: (!) 97.3 F (36.3 C)  SpO2: 96%   Filed Weights   11/04/22 1518  Weight: 91 lb 1.6 oz (41.3 kg)    Physical Exam          (exam performed in the presence of a chaperone)  LABORATORY DATA:  I have reviewed the data as listed    Latest Ref Rng & Units 11/04/2022    2:56 PM 10/06/2022   10:28 AM 09/08/2022   10:11 AM  CMP  Glucose 70 - 99 mg/dL 85  95  94   BUN 8 - 23 mg/dL 24  24  18    Creatinine 0.44 - 1.00 mg/dL 9.60  4.54  0.98   Sodium 135 - 145 mmol/L 143  140  140   Potassium 3.5 - 5.1 mmol/L 3.7  4.1  4.2   Chloride 98 - 111 mmol/L 105  105  105   CO2 22 - 32 mmol/L 32  28  29   Calcium 8.9 - 10.3 mg/dL 11.9  14.7  82.9   Total Protein 6.5 - 8.1 g/dL 6.9  6.6  6.6   Total Bilirubin 0.3 - 1.2 mg/dL 0.4  0.5  0.5   Alkaline Phos 38 - 126 U/L 113  73  81   AST 15 - 41 U/L 26  27  28    ALT 0 - 44 U/L 17  13  13      Lab Results   Component Value Date   WBC 5.5 11/04/2022   HGB 11.6 (L) 11/04/2022   HCT 33.5 (L) 11/04/2022   MCV 108.4 (H) 11/04/2022   PLT 225 11/04/2022   NEUTROABS 3.6 11/04/2022    ASSESSMENT & PLAN:  Malignant neoplasm of upper-outer quadrant of right breast in female, estrogen receptor positive (HCC) 07/01/2022:Right breast periareolar hardening and redness at 10 o'clock position measuring 1.7 cm with skin thickening extending to the nipple and an additional area 0.9 cm contiguous with the skin axilla, negative biopsy revealed grade 2 ILC ER 95% PR 20% Ki67 10%, HER2 negative 1+ by IHC.  Left breast calcifications retroareolar 9 o'clock position 2.6 cm biopsy was discordant excision  recommended.    07/28/2022: CT CAP: Widespread bone metastases throughout the visualized bone structures, 6 mm right upper lobe pulm nodule, bilateral adrenal adenomas, 2.7 cm aortic aneurysm, emphysema 07/28/2022: Bone scan: Diffuse osseous metastatic disease ------------------------------------------------------------------------------------------------------------------------------------------------ Current treatment: Verzinio (started 08/06/2022) with letrozole and Xgeva every 3 months Verzinio toxicities: Very occasional diarrhea Slight fatigue Overall she tolerates Verzinio fairly well. She thinks that this tumor on the breast is shrinking significantly.   CT CAP 11/03/2022: Stable bone metastases.  Stable 6 mm right upper lobe nodule, stable left adrenal nodule, possible cystitis  Continue with the current treatment ------------------------------------- Assessment and Plan    Metastatic Bone Cancer Stable disease on recent CT scan with no new masses or lymph nodes. Multiple bones involved but similar to previous films. -Continue current regimen of Verzenio 50mg  twice daily.  Verzenio Side Effects Reports of fatigue and occasional diarrhea. Labs do not suggest any other cause for fatigue. -Monitor symptoms  and continue current dose of Verzenio.  Non-Cancer Related Findings Small nodule in right lung and adrenal gland, fatty liver, haziness in pelvis, and thickened bladder wall. No symptoms related to these findings. -Continue to monitor these findings on future scans.  Vaccinations Recently received pneumonia vaccine and considering COVID and RSV vaccines. -Endorsement for receiving both COVID and RSV vaccines.  Follow-up Plan to see Jonny Ruiz in a month for shot and then follow-up in two months. Future scans will be every six months.          No orders of the defined types were placed in this encounter.  The patient has a good understanding of the overall plan. she agrees with it. she will call with any problems that may develop before the next visit here. Total time spent: 30 mins including face to face time and time spent for planning, charting and co-ordination of care   Tamsen Meek, MD 11/04/22

## 2022-11-04 NOTE — Assessment & Plan Note (Signed)
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  Left breast calcifications retroareolar 9 o'clock position 2.6 cm biopsy was discordant excision recommended.    07/28/2022: CT CAP: Widespread bone metastases throughout the visualized bone structures, 6 mm right upper lobe pulm nodule, bilateral adrenal adenomas, 2.7 cm aortic aneurysm, emphysema 07/28/2022: Bone scan: Diffuse osseous metastatic disease ------------------------------------------------------------------------------------------------------------------------------------------------ Current treatment: Verzinio (started 08/06/2022) with letrozole and Xgeva every 3 months Verzinio toxicities: Very occasional diarrhea Slight fatigue Overall Madison Richards tolerates Verzinio fairly well. Madison Richards thinks that this tumor on the breast is shrinking significantly.   CT CAP 11/03/2022: Stable bone metastases.  Stable 6 mm right upper lobe nodule, stable left adrenal nodule, possible cystitis  Continue with the current treatment

## 2022-11-13 DIAGNOSIS — N390 Urinary tract infection, site not specified: Secondary | ICD-10-CM | POA: Diagnosis not present

## 2022-11-17 ENCOUNTER — Other Ambulatory Visit: Payer: Self-pay | Admitting: Hematology and Oncology

## 2022-11-17 ENCOUNTER — Other Ambulatory Visit: Payer: Self-pay

## 2022-11-17 MED ORDER — ABEMACICLIB 50 MG PO TABS
50.0000 mg | ORAL_TABLET | Freq: Two times a day (BID) | ORAL | 1 refills | Status: DC
  Filled 2022-11-17: qty 56, 28d supply, fill #0
  Filled 2022-12-17: qty 56, 28d supply, fill #1

## 2022-11-17 NOTE — Progress Notes (Signed)
Specialty Pharmacy Refill Coordination Note  Madison Richards is a 88 y.o. female contacted today regarding refills of specialty medication(s) Abemaciclib .  Patient requested Pickup at [facility] at [place]  on 11/24/22   Pending refill request, Medication will be filled on 11/21/22.

## 2022-11-17 NOTE — Progress Notes (Signed)
Specialty Pharmacy Ongoing Clinical Assessment Note  Madison Richards is a 87 y.o. female who is being followed by the specialty pharmacy service for RxSp Oncology   Review of patient's specialty medication(s) Abemaciclib  occurred today.   Patient has missed 0  doses in the last 4 weeks.   Patient did not have any additional questions or concerns.   Therapeutic benefit summary: Patient is achieving benefit   Adverse events/side effects summary: No adverse events/side effects   Patient's therapy is appropriate to : Continue    Goals      Slow Disease Progression     Patient is on track. Patient will maintain adherence         Follow up:  6 months

## 2022-11-19 DIAGNOSIS — H34831 Tributary (branch) retinal vein occlusion, right eye, with macular edema: Secondary | ICD-10-CM | POA: Diagnosis not present

## 2022-11-24 ENCOUNTER — Other Ambulatory Visit (HOSPITAL_COMMUNITY): Payer: Self-pay

## 2022-12-01 ENCOUNTER — Inpatient Hospital Stay: Payer: Medicare PPO

## 2022-12-01 ENCOUNTER — Inpatient Hospital Stay: Payer: Medicare PPO | Admitting: Pharmacist

## 2022-12-01 ENCOUNTER — Inpatient Hospital Stay: Payer: Medicare PPO | Attending: Hematology and Oncology

## 2022-12-01 VITALS — BP 114/49 | HR 62 | Temp 97.5°F | Resp 18 | Ht 63.0 in | Wt 90.1 lb

## 2022-12-01 DIAGNOSIS — C50411 Malignant neoplasm of upper-outer quadrant of right female breast: Secondary | ICD-10-CM | POA: Diagnosis not present

## 2022-12-01 DIAGNOSIS — J439 Emphysema, unspecified: Secondary | ICD-10-CM | POA: Diagnosis not present

## 2022-12-01 DIAGNOSIS — D3502 Benign neoplasm of left adrenal gland: Secondary | ICD-10-CM | POA: Diagnosis not present

## 2022-12-01 DIAGNOSIS — R5383 Other fatigue: Secondary | ICD-10-CM | POA: Insufficient documentation

## 2022-12-01 DIAGNOSIS — Z881 Allergy status to other antibiotic agents status: Secondary | ICD-10-CM | POA: Diagnosis not present

## 2022-12-01 DIAGNOSIS — Z79899 Other long term (current) drug therapy: Secondary | ICD-10-CM | POA: Diagnosis not present

## 2022-12-01 DIAGNOSIS — C50919 Malignant neoplasm of unspecified site of unspecified female breast: Secondary | ICD-10-CM

## 2022-12-01 DIAGNOSIS — Z880 Allergy status to penicillin: Secondary | ICD-10-CM | POA: Diagnosis not present

## 2022-12-01 DIAGNOSIS — Z79811 Long term (current) use of aromatase inhibitors: Secondary | ICD-10-CM | POA: Insufficient documentation

## 2022-12-01 DIAGNOSIS — D3501 Benign neoplasm of right adrenal gland: Secondary | ICD-10-CM | POA: Diagnosis not present

## 2022-12-01 DIAGNOSIS — K760 Fatty (change of) liver, not elsewhere classified: Secondary | ICD-10-CM | POA: Insufficient documentation

## 2022-12-01 DIAGNOSIS — C7951 Secondary malignant neoplasm of bone: Secondary | ICD-10-CM | POA: Diagnosis not present

## 2022-12-01 DIAGNOSIS — Z7902 Long term (current) use of antithrombotics/antiplatelets: Secondary | ICD-10-CM | POA: Insufficient documentation

## 2022-12-01 DIAGNOSIS — Z170 Estrogen receptor positive status [ER+]: Secondary | ICD-10-CM | POA: Diagnosis not present

## 2022-12-01 LAB — CBC WITH DIFFERENTIAL (CANCER CENTER ONLY)
Abs Immature Granulocytes: 0.02 10*3/uL (ref 0.00–0.07)
Basophils Absolute: 0.1 10*3/uL (ref 0.0–0.1)
Basophils Relative: 2 %
Eosinophils Absolute: 0.1 10*3/uL (ref 0.0–0.5)
Eosinophils Relative: 2 %
HCT: 35.3 % — ABNORMAL LOW (ref 36.0–46.0)
Hemoglobin: 11.9 g/dL — ABNORMAL LOW (ref 12.0–15.0)
Immature Granulocytes: 0 %
Lymphocytes Relative: 13 %
Lymphs Abs: 0.8 10*3/uL (ref 0.7–4.0)
MCH: 38.1 pg — ABNORMAL HIGH (ref 26.0–34.0)
MCHC: 33.7 g/dL (ref 30.0–36.0)
MCV: 113.1 fL — ABNORMAL HIGH (ref 80.0–100.0)
Monocytes Absolute: 0.5 10*3/uL (ref 0.1–1.0)
Monocytes Relative: 9 %
Neutro Abs: 4.4 10*3/uL (ref 1.7–7.7)
Neutrophils Relative %: 74 %
Platelet Count: 211 10*3/uL (ref 150–400)
RBC: 3.12 MIL/uL — ABNORMAL LOW (ref 3.87–5.11)
RDW: 15.9 % — ABNORMAL HIGH (ref 11.5–15.5)
WBC Count: 5.9 10*3/uL (ref 4.0–10.5)
nRBC: 0 % (ref 0.0–0.2)

## 2022-12-01 LAB — CMP (CANCER CENTER ONLY)
ALT: 27 U/L (ref 0–44)
AST: 33 U/L (ref 15–41)
Albumin: 3.9 g/dL (ref 3.5–5.0)
Alkaline Phosphatase: 119 U/L (ref 38–126)
Anion gap: 6 (ref 5–15)
BUN: 20 mg/dL (ref 8–23)
CO2: 29 mmol/L (ref 22–32)
Calcium: 10.3 mg/dL (ref 8.9–10.3)
Chloride: 104 mmol/L (ref 98–111)
Creatinine: 1.07 mg/dL — ABNORMAL HIGH (ref 0.44–1.00)
GFR, Estimated: 51 mL/min — ABNORMAL LOW (ref >60)
Glucose, Bld: 115 mg/dL — ABNORMAL HIGH (ref 70–99)
Potassium: 4.1 mmol/L (ref 3.5–5.1)
Sodium: 139 mmol/L (ref 135–145)
Total Bilirubin: 0.5 mg/dL (ref 0.3–1.2)
Total Protein: 6.9 g/dL (ref 6.5–8.1)

## 2022-12-01 MED ORDER — DENOSUMAB 120 MG/1.7ML ~~LOC~~ SOLN
120.0000 mg | Freq: Once | SUBCUTANEOUS | Status: AC
  Administered 2022-12-01: 120 mg via SUBCUTANEOUS

## 2022-12-01 NOTE — Progress Notes (Addendum)
[facility]       Telephone: [phone]?Fax: [phone]   Oncology Clinical Pharmacist Practitioner Progress Note  Madison Richards is a 88 y.o. female with a diagnosis of breast cancer. She was contacted today via in-person visit. She is accompanied by her friend Madison Richards.    Current treatment regimen and start date Abemaciclib (08/09/22) Letrozole (08/06/22) Denosumab 120 mg (09/08/22)  Interval History She continues on abemaciclib 50 mg by mouth every 12 hours on days 1 to 28 of a 28-day cycle. This is being given in combination with letrozole and denosumab 120 mg. Therapy is planned to continue until disease progression or unacceptable toxicity. Today, she reports peripheral edema which usually starts in the afternoon to early evening. It lasts from the time of her denosumab injection and usually takes 2-3 months to resolve. This is unchanged from her last visit. Of note, she is also on hydrochlorothiazide and amlodipine-valsartan. She also endorses having chronic fatigue; however, today she reports her energy levels have been significantly improved over the past few days.    Madison Richards was seen today by clinical pharmacy as a follow-up to her abemaciclib management. She was last seen by clinical pharmacy on 10/06/22 and by Dr. Pamelia Hoit on 11/04/22.   Response to Therapy Overall, Madison Richards is tolerating abemaciclib well. Labs, vitals, treatment parameters, and manufacturer guidelines assessing toxicity were reviewed with Madison Richards today. Her serum creatinine was slightly elevated today, but overall remains stable. She is aware and knows to drink water at home in order to optimize her renal function. Her tumor markers CA 27.29 and CA15-3 are currently in process to be reviewed by Dr. Pamelia Hoit prior to her next visit. They have been trending down since starting abemaciclib. She has not experienced any diarrhea since her last visit, nor has she needed to use loperamide to manage her  symptoms. No reports of pneumonitis or VTE events. No concerns with adherence to her current regimen. Based on these values, patient is in agreement to continue abemaciclib therapy at this time.  Allergies Allergies  Allergen Reactions   Bacitracin-Polymyxin B     Other reaction(s): AREA APPLIED TO SPREAD   Fosamax [Alendronate Sodium]     Leg cramps   Clindamycin/Lincomycin Rash    Rash in mouth   Neosporin [Neomycin-Polymyxin-Gramicidin] Rash   Penicillins Rash    Rash in mouth    Vitals    12/01/2022   11:12 AM 11/04/2022    3:18 PM 10/06/2022   11:03 AM  Oncology Vitals  Height 160 cm 160 cm 160 cm  Weight 40.869 kg 41.323 kg 40.098 kg  Weight (lbs) 90 lbs 2 oz 91 lbs 2 oz 88 lbs 6 oz  BMI 15.96 kg/m2 16.14 kg/m2 15.66 kg/m2  Temp 97.5 F (36.4 C) 97.3 F (36.3 C) 97.3 F (36.3 C)  Pulse Rate 62 78 65  BP 114/49 119/42 103/58  Resp 18 18 18   SpO2 100 % 96 % 100 %  BSA (m2) 1.35 m2 1.35 m2 1.34 m2    Laboratory Data    Latest Ref Rng & Units 12/01/2022   10:33 AM 11/04/2022    2:56 PM 10/06/2022   10:28 AM  CBC EXTENDED  WBC 4.0 - 10.5 K/uL 5.9  5.5  5.8   RBC 3.87 - 5.11 MIL/uL 3.12  3.09  3.42   Hemoglobin 12.0 - 15.0 g/dL 01.0  27.2  53.6   HCT 36.0 - 46.0 % 35.3  33.5  36.8   Platelets 150 - 400 K/uL 211  225  236   NEUT# 1.7 - 7.7 K/uL 4.4  3.6  4.3   Lymph# 0.7 - 4.0 K/uL 0.8  1.0  0.8        Latest Ref Rng & Units 12/01/2022   10:33 AM 11/04/2022    2:56 PM 10/06/2022   10:28 AM  CMP  Glucose 70 - 99 mg/dL 409  85  95   BUN 8 - 23 mg/dL 20  24  24    Creatinine 0.44 - 1.00 mg/dL 8.11  9.14  7.82   Sodium 135 - 145 mmol/L 139  143  140   Potassium 3.5 - 5.1 mmol/L 4.1  3.7  4.1   Chloride 98 - 111 mmol/L 104  105  105   CO2 22 - 32 mmol/L 29  32  28   Calcium 8.9 - 10.3 mg/dL 95.6  21.3  08.6   Total Protein 6.5 - 8.1 g/dL 6.9  6.9  6.6   Total Bilirubin 0.3 - 1.2 mg/dL 0.5  0.4  0.5   Alkaline Phos 38 - 126 U/L 119  113  73   AST 15 - 41 U/L 33   26  27   ALT 0 - 44 U/L 27  17  13      Lab Results  Component Value Date   CA2729 62.9 (H) 11/04/2022   CA2729 64.3 (H) 10/06/2022   CA2729 72.0 (H) 09/08/2022   Lab Results  Component Value Date   CAN153 52.0 (H) 11/04/2022   VHQ469 57.7 (H) 10/06/2022   GEX528 63.0 (H) 09/08/2022   Adverse Effects Assessment Serum creatinine: Slightly elevated above normal but overall stable. Fatigue: Mild-moderate but has shown improvement over the last few days. Will continue to evaluate at future visits.  Peripheral edema: Mainly located in the ankles and started soon after her first injection of denosumab. She does not feel that the edema is worsening and we did discuss to continue to watch for VTE symptoms which at times can be similar. Will continue to evaluate at future visits.   Adherence Assessment Madison Richards reports missing 0 doses over the past 4 weeks.   Reason for missed dose: N/A Patient was re-educated on importance of adherence.   Access Assessment Madison Richards is currently receiving her abemaciclib through Madison Richards concerns:  None  Medication Reconciliation The patient's medication list was reviewed today with the patient? Yes New medications or herbal supplements have recently been started? No  Any medications have been discontinued? No  The medication list was updated and reconciled based on the patient's most recent medication list in the electronic medical record (EMR) including herbal products and OTC medications.   Medications Current Outpatient Medications  Medication Sig Dispense Refill   abemaciclib (VERZENIO) 50 MG tablet Take 1 tablet (50 mg total) by mouth 2 (two) times daily. 56 tablet 1   amLODipine-valsartan (EXFORGE) 5-160 MG tablet TAKE 1 TABLET BY MOUTH DAILY 90 tablet 1   Calcium Carbonate-Vitamin D (CALCIUM 600 + D PO) Take 1 tablet by mouth in the morning and at bedtime.     Cholecalciferol (VITAMIN D3) 5000  UNITS TABS Take 1 tablet by mouth every Monday, Wednesday, and Friday.     clopidogrel (PLAVIX) 75 MG tablet TAKE 1 TABLET BY MOUTH EVERY DAY 90 tablet 1   Coenzyme Q10 (COQ10 PO) Take 200 mg by mouth.     colestipol (COLESTID)  1 g tablet Take 1 g by mouth daily. Take 2 pills daily     ezetimibe (ZETIA) 10 MG tablet Take 10 mg by mouth daily.     hydrochlorothiazide (MICROZIDE) 12.5 MG capsule TAKE 1 CAPSULE(12.5 MG) BY MOUTH DAILY 90 capsule 1   letrozole (FEMARA) 2.5 MG tablet Take 1 tablet (2.5 mg total) by mouth daily. 90 tablet 3   loratadine (CLARITIN) 10 MG tablet Take 10 mg by mouth daily as needed for allergies.      metoprolol tartrate (LOPRESSOR) 25 MG tablet Take 1 tablet (25 mg total) by mouth 2 (two) times daily. 180 tablet 1   Multiple Vitamin (MULTIVITAMIN WITH MINERALS) TABS Take 1 tablet by mouth daily.     pravastatin (PRAVACHOL) 40 MG tablet Take 1 tablet (40 mg total) by mouth at bedtime. 30 tablet 3   No current facility-administered medications for this visit.   Drug-Drug Interactions (DDIs) DDIs were evaluated? Yes Significant DDIs? No  The patient was instructed to speak with their health care provider and/or the oral chemotherapy pharmacist before starting any new drug, including prescription or over the counter, natural / herbal products, or vitamins.  Supportive Care Diarrhea: we reviewed that diarrhea is common with abemaciclib and confirmed that she does have loperamide (Imodium) at home.  We reviewed how to take this medication PRN. Neutropenia: we discussed the importance of having a thermometer and what the Centers for Disease Control and Prevention (CDC) considers a fever which is 100.80F (38C) or higher.  Gave patient 24/7 triage line to call if any fevers or symptoms. ILD/Pneumonitis: we reviewed potential symptoms including cough, shortness, and fatigue.  VTE: reviewed signs of DVT such as leg swelling, redness, pain, or tenderness and signs of PE such as  shortness of breath, rapid or irregular heartbeat, cough, chest pain, or lightheadedness. Reviewed to take the medication every 12 hours (with food sometimes can be easier on the stomach) and to take it at the same time every day. Hepatotoxicity: WNL Drug interactions with grapefruit products Peripheral edema: will monitor with denosumab injections   Dosing Assessment Hepatic adjustments needed? No  Renal adjustments needed? No  Toxicity adjustments needed? No  The current dosing regimen is appropriate to continue at this time.  Follow-Up Plan Continue abemaciclib 50 mg every 12 hours by mouth Continue letrozole 2.5 mg by mouth daily Continue denosumab 120 mg every 12 weeks. Next due 02/22/22 Monitor peripheral edema and serum creatinine  Will add labs, Dr. Pamelia Hoit visit, denosumab injection on 02/22/22. Q6 month restaging scans can be ordered for March during this visit. Last done 11/03/22. Will add labs, pharmacy clinic visit, denosumab injection 05/18/22.   Scans ordered by dr Pamelia Hoit at January visit for march   Madison Richards participated in the discussion, expressed understanding, and voiced agreement with the above plan. All questions were answered to her satisfaction. The patient was advised to contact the clinic at [phone] with any questions or concerns prior to her return visit.   I spent 30 minutes assessing and educating the patient.  Axelle Caras, PharmD PGY2 Oncology Pharmacy Resident   12/01/2022 11:46 AM

## 2022-12-01 NOTE — Progress Notes (Signed)
Follow up will be as follows - Labs, Dr. Pamelia Hoit visit, denosumab 120 mg Madison Richards) on 02/23/23 - Labs, pharmacy clinic visit, denosumab 120 mg Madison Richards) on 05/18/23

## 2022-12-02 LAB — CANCER ANTIGEN 27.29: CA 27.29: 65.9 U/mL — ABNORMAL HIGH (ref 0.0–38.6)

## 2022-12-02 LAB — CANCER ANTIGEN 15-3: CA 15-3: 50.9 U/mL — ABNORMAL HIGH (ref 0.0–25.0)

## 2022-12-03 ENCOUNTER — Other Ambulatory Visit: Payer: Self-pay

## 2022-12-03 MED ORDER — CLOPIDOGREL BISULFATE 75 MG PO TABS: 75.0000 mg | ORAL_TABLET | Freq: Every day | ORAL | 1 refills | Status: DC

## 2022-12-11 ENCOUNTER — Other Ambulatory Visit: Payer: Self-pay

## 2022-12-17 ENCOUNTER — Other Ambulatory Visit: Payer: Self-pay

## 2022-12-17 NOTE — Progress Notes (Signed)
Specialty Pharmacy Refill Coordination Note  Madison Richards is a 88 y.o. female contacted today regarding refills of specialty medication(s) Abemaciclib   Patient requested Madison Richards at [facility] at [place] date: 12/22/22   Medication will be filled on 12/19/22.

## 2022-12-22 ENCOUNTER — Other Ambulatory Visit (HOSPITAL_COMMUNITY): Payer: Self-pay

## 2022-12-27 ENCOUNTER — Other Ambulatory Visit: Payer: Self-pay | Admitting: Cardiology

## 2023-01-13 ENCOUNTER — Other Ambulatory Visit (HOSPITAL_COMMUNITY): Payer: Self-pay

## 2023-01-13 ENCOUNTER — Other Ambulatory Visit: Payer: Self-pay

## 2023-01-13 ENCOUNTER — Other Ambulatory Visit: Payer: Self-pay | Admitting: Hematology and Oncology

## 2023-01-13 ENCOUNTER — Other Ambulatory Visit (HOSPITAL_COMMUNITY): Payer: Self-pay | Admitting: Pharmacy Technician

## 2023-01-13 MED ORDER — ABEMACICLIB 50 MG PO TABS
50.0000 mg | ORAL_TABLET | Freq: Two times a day (BID) | ORAL | 1 refills | Status: DC
  Filled 2023-01-13: qty 56, 28d supply, fill #0
  Filled 2023-02-06: qty 56, 28d supply, fill #1

## 2023-01-13 NOTE — Progress Notes (Signed)
Pending Refill request completed.

## 2023-01-13 NOTE — Progress Notes (Signed)
Specialty Pharmacy Refill Coordination Note  Madison Richards is a 88 y.o. female contacted today regarding refills of specialty medication(s) Abemaciclib   Patient requested [name] at [facility] at [place] date: 01/21/23   Medication will be filled on 01/20/23.  Refill sent to MD; Call if any delays

## 2023-01-19 ENCOUNTER — Other Ambulatory Visit: Payer: Self-pay

## 2023-01-21 ENCOUNTER — Other Ambulatory Visit (HOSPITAL_COMMUNITY): Payer: Self-pay

## 2023-01-21 ENCOUNTER — Encounter: Payer: Self-pay | Admitting: Hematology and Oncology

## 2023-01-25 ENCOUNTER — Other Ambulatory Visit: Payer: Self-pay | Admitting: Cardiology

## 2023-01-26 ENCOUNTER — Ambulatory Visit: Payer: Self-pay | Admitting: Cardiology

## 2023-01-26 MED ORDER — AMLODIPINE BESYLATE-VALSARTAN 5-160 MG PO TABS: 1.0000 | ORAL_TABLET | Freq: Every day | ORAL | 0 refills | Status: DC

## 2023-02-06 ENCOUNTER — Other Ambulatory Visit: Payer: Self-pay

## 2023-02-06 DIAGNOSIS — N390 Urinary tract infection, site not specified: Secondary | ICD-10-CM | POA: Diagnosis not present

## 2023-02-06 NOTE — Progress Notes (Signed)
Specialty Pharmacy Refill Coordination Note  Madison Richards is a 88 y.o. female contacted today regarding refills of specialty medication(s) Abemaciclib [name])   Patient requested [name] at [facility] at [place] [employer] date: 02/16/23   Medication will be filled on 02/13/23.

## 2023-02-13 ENCOUNTER — Other Ambulatory Visit: Payer: Self-pay

## 2023-02-16 ENCOUNTER — Other Ambulatory Visit (HOSPITAL_COMMUNITY): Payer: Self-pay

## 2023-02-23 ENCOUNTER — Inpatient Hospital Stay: Payer: Medicare PPO

## 2023-02-23 ENCOUNTER — Inpatient Hospital Stay: Payer: Medicare PPO | Attending: Hematology and Oncology

## 2023-02-23 ENCOUNTER — Inpatient Hospital Stay: Payer: Medicare PPO | Admitting: Hematology and Oncology

## 2023-02-23 VITALS — BP 122/44 | HR 71 | Temp 97.6°F | Resp 18 | Ht 63.0 in | Wt 90.1 lb

## 2023-02-23 DIAGNOSIS — D3501 Benign neoplasm of right adrenal gland: Secondary | ICD-10-CM | POA: Diagnosis not present

## 2023-02-23 DIAGNOSIS — Z7902 Long term (current) use of antithrombotics/antiplatelets: Secondary | ICD-10-CM | POA: Insufficient documentation

## 2023-02-23 DIAGNOSIS — Z170 Estrogen receptor positive status [ER+]: Secondary | ICD-10-CM | POA: Insufficient documentation

## 2023-02-23 DIAGNOSIS — J439 Emphysema, unspecified: Secondary | ICD-10-CM | POA: Insufficient documentation

## 2023-02-23 DIAGNOSIS — R5383 Other fatigue: Secondary | ICD-10-CM | POA: Diagnosis not present

## 2023-02-23 DIAGNOSIS — D3502 Benign neoplasm of left adrenal gland: Secondary | ICD-10-CM | POA: Insufficient documentation

## 2023-02-23 DIAGNOSIS — Z79899 Other long term (current) drug therapy: Secondary | ICD-10-CM | POA: Insufficient documentation

## 2023-02-23 DIAGNOSIS — C7951 Secondary malignant neoplasm of bone: Secondary | ICD-10-CM | POA: Diagnosis not present

## 2023-02-23 DIAGNOSIS — K760 Fatty (change of) liver, not elsewhere classified: Secondary | ICD-10-CM | POA: Diagnosis not present

## 2023-02-23 DIAGNOSIS — C50919 Malignant neoplasm of unspecified site of unspecified female breast: Secondary | ICD-10-CM

## 2023-02-23 DIAGNOSIS — C50411 Malignant neoplasm of upper-outer quadrant of right female breast: Secondary | ICD-10-CM | POA: Insufficient documentation

## 2023-02-23 DIAGNOSIS — Z79811 Long term (current) use of aromatase inhibitors: Secondary | ICD-10-CM | POA: Insufficient documentation

## 2023-02-23 DIAGNOSIS — Z881 Allergy status to other antibiotic agents status: Secondary | ICD-10-CM | POA: Insufficient documentation

## 2023-02-23 DIAGNOSIS — Z880 Allergy status to penicillin: Secondary | ICD-10-CM | POA: Insufficient documentation

## 2023-02-23 LAB — CBC WITH DIFFERENTIAL (CANCER CENTER ONLY)
Abs Immature Granulocytes: 0.02 10*3/uL (ref 0.00–0.07)
Basophils Absolute: 0.1 10*3/uL (ref 0.0–0.1)
Basophils Relative: 2 %
Eosinophils Absolute: 0.1 10*3/uL (ref 0.0–0.5)
Eosinophils Relative: 1 %
HCT: 34.3 % — ABNORMAL LOW (ref 36.0–46.0)
Hemoglobin: 11.9 g/dL — ABNORMAL LOW (ref 12.0–15.0)
Immature Granulocytes: 0 %
Lymphocytes Relative: 15 %
Lymphs Abs: 0.8 10*3/uL (ref 0.7–4.0)
MCH: 39 pg — ABNORMAL HIGH (ref 26.0–34.0)
MCHC: 34.7 g/dL (ref 30.0–36.0)
MCV: 112.5 fL — ABNORMAL HIGH (ref 80.0–100.0)
Monocytes Absolute: 0.6 10*3/uL (ref 0.1–1.0)
Monocytes Relative: 11 %
Neutro Abs: 3.7 10*3/uL (ref 1.7–7.7)
Neutrophils Relative %: 71 %
Platelet Count: 204 10*3/uL (ref 150–400)
RBC: 3.05 MIL/uL — ABNORMAL LOW (ref 3.87–5.11)
RDW: 14.6 % (ref 11.5–15.5)
WBC Count: 5.3 10*3/uL (ref 4.0–10.5)
nRBC: 0 % (ref 0.0–0.2)

## 2023-02-23 LAB — CMP (CANCER CENTER ONLY)
ALT: 41 U/L (ref 0–44)
AST: 47 U/L — ABNORMAL HIGH (ref 15–41)
Albumin: 3.9 g/dL (ref 3.5–5.0)
Alkaline Phosphatase: 157 U/L — ABNORMAL HIGH (ref 38–126)
Anion gap: 5 (ref 5–15)
BUN: 29 mg/dL — ABNORMAL HIGH (ref 8–23)
CO2: 31 mmol/L (ref 22–32)
Calcium: 10.5 mg/dL — ABNORMAL HIGH (ref 8.9–10.3)
Chloride: 105 mmol/L (ref 98–111)
Creatinine: 0.94 mg/dL (ref 0.44–1.00)
GFR, Estimated: 59 mL/min — ABNORMAL LOW (ref >60)
Glucose, Bld: 134 mg/dL — ABNORMAL HIGH (ref 70–99)
Potassium: 4.1 mmol/L (ref 3.5–5.1)
Sodium: 141 mmol/L (ref 135–145)
Total Bilirubin: 0.4 mg/dL (ref 0.0–1.2)
Total Protein: 6.8 g/dL (ref 6.5–8.1)

## 2023-02-23 MED ORDER — DENOSUMAB 120 MG/1.7ML ~~LOC~~ SOLN
120.0000 mg | Freq: Once | SUBCUTANEOUS | Status: AC
  Administered 2023-02-23: 120 mg via SUBCUTANEOUS
  Filled 2023-02-23: qty 1.7

## 2023-02-23 MED ORDER — ESTRADIOL 0.1 MG/GM VA CREA: 1.0000 | TOPICAL_CREAM | VAGINAL | 1 refills | Status: AC

## 2023-02-23 NOTE — Progress Notes (Signed)
 Patient Care Team: Cleotilde Planas, MD as PCP - General (Family Medicine) Odean Potts, MD as Consulting Physician (Hematology and Oncology) Dewey Rush, MD as Consulting Physician (Radiation Oncology) Curvin Deward MOULD, MD as Consulting Physician (General Surgery) Glean Stephane BROCKS, RN as Oncology Nurse Navigator Tyree Nanetta SAILOR, RN as Oncology Nurse Navigator Lucila Rush LABOR, RPH-CPP as Pharmacist (Hematology and Oncology)  DIAGNOSIS:  Encounter Diagnoses  Name Primary?   Malignant neoplasm of upper-outer quadrant of right breast in female, estrogen receptor positive (HCC) Yes   Primary malignant neoplasm of breast with metastasis to other site, unspecified laterality (HCC)     SUMMARY OF ONCOLOGIC HISTORY: Oncology History  Malignant neoplasm of upper-outer quadrant of right breast in female, estrogen receptor positive (HCC)  07/01/2022 Initial Diagnosis   Right breast periareolar hardening and redness at 10 o'clock position measuring 1.7 cm with skin thickening extending to the nipple and an additional area 0.9 cm contiguous with the skin axilla, negative biopsy revealed grade 2 ILC ER 95% PR 20% Ki67 10%, HER2 negative 1+ by IHC.  Left breast calcifications retroareolar 9 o'clock position 2.6 cm biopsy was discordant excision recommended.   07/16/2022 Cancer Staging   Staging form: Breast, AJCC 8th Edition - Clinical: Stage IIIB (cT4, cN0, cM0, G2, ER+, PR+, HER2-) - Signed by Odean Potts, MD on 07/16/2022 Stage prefix: Initial diagnosis Histologic grading system: 3 grade system   07/23/2022 Genetic Testing   Negative [facility] Multi-Cancer + RNA Panel. 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 Report date is July 23, 2022.  The Multi-Cancer + RNA Panel offered by [facility] includes sequencing and/or deletion/duplication analysis of the following 70 genes:  AIP*, ALK, APC*, ATM*, AXIN2*, BAP1*, BARD1*, BLM*, BMPR1A*, BRCA1*, BRCA2*, BRIP1*, CDC73*, CDH1*, CDK4, CDKN1B*, CDKN2A,  CHEK2*, CTNNA1*, DICER1*, EPCAM (del/dup only), EGFR, FH*, FLCN*, GREM1 (promoter dup only), HOXB13, KIT, LZTR1, MAX*, MBD4, MEN1*, MET, MITF, MLH1*, MSH2*, MSH3*, MSH6*, MUTYH*, NF1*, NF2*, NTHL1*, PALB2*, PDGFRA, PMS2*, POLD1*, POLE*, POT1*, PRKAR1A*, PTCH1*, PTEN*, RAD51C*, RAD51D*, RB1*, RET, SDHA* (sequencing only), SDHAF2*, SDHB*, SDHC*, SDHD*, SMAD4*, SMARCA4*, SMARCB1*, SMARCE1*, STK11*, SUFU*, TMEM127*, TP53*, TSC1*, TSC2*, VHL*. RNA analysis is performed for * genes.     CHIEF COMPLIANT: Follow-up on Verzenio   HISTORY OF PRESENT ILLNESS:   History of Present Illness   The patient, on verzinio and letrozole  regimen, reports experiencing occasional bouts of diarrhea approximately once a month. These episodes are uncomfortable but manageable with Imodium, although this medication subsequently leads to constipation. The patient's energy levels remain low, and her appetite has slightly improved. She also reports frequent UTIs, a new development in the past six months after a thirty-year absence of such infections. The patient missed one dose of Valine due to dropping the pill down the sink.         ALLERGIES:  is allergic to bacitracin-polymyxin b, fosamax [alendronate sodium], clindamycin/lincomycin, neosporin [neomycin-polymyxin-gramicidin], and penicillins.  MEDICATIONS:  Current Outpatient Medications  Medication Sig Dispense Refill   estradiol  (ESTRACE  VAGINAL) 0.1 MG/GM vaginal cream Place 1 Applicatorful vaginally 2 (two) times a week. 42.5 g 1   abemaciclib  (VERZENIO ) 50 MG tablet Take 1 tablet (50 mg total) by mouth 2 (two) times daily. 56 tablet 1   amLODipine -valsartan  (EXFORGE ) 5-160 MG tablet Take 1 tablet by mouth daily. 30 tablet 0   Calcium Carbonate-Vitamin D  (CALCIUM 600 + D PO) Take 1 tablet by mouth in the morning and at bedtime.     Cholecalciferol  (VITAMIN D3) 5000 UNITS TABS Take 1 tablet by mouth  every Monday, Wednesday, and Friday.     clopidogrel  (PLAVIX ) 75 MG  tablet Take 1 tablet (75 mg total) by mouth daily. 90 tablet 1   Coenzyme Q10 (COQ10 PO) Take 200 mg by mouth.     colestipol  (COLESTID ) 1 g tablet Take 1 g by mouth daily. Take 2 pills daily     ezetimibe  (ZETIA ) 10 MG tablet Take 10 mg by mouth daily.     hydrochlorothiazide  (MICROZIDE ) 12.5 MG capsule TAKE 1 CAPSULE(12.5 MG) BY MOUTH DAILY 30 capsule 0   letrozole  (FEMARA ) 2.5 MG tablet Take 1 tablet (2.5 mg total) by mouth daily. 90 tablet 3   loratadine  (CLARITIN ) 10 MG tablet Take 10 mg by mouth daily as needed for allergies.      metoprolol  tartrate (LOPRESSOR ) 25 MG tablet TAKE 1 TABLET BY MOUTH TWICE A DAY 180 tablet 3   Multiple Vitamin (MULTIVITAMIN WITH MINERALS) TABS Take 1 tablet by mouth daily.     pravastatin  (PRAVACHOL ) 40 MG tablet Take 1 tablet (40 mg total) by mouth at bedtime. 30 tablet 3   No current facility-administered medications for this visit.   Facility-Administered Medications Ordered in Other Visits  Medication Dose Route Frequency Provider Last Rate Last Admin   denosumab  (XGEVA ) injection 120 mg  120 mg Subcutaneous Once Vandora Jaskulski, MD        PHYSICAL EXAMINATION: ECOG PERFORMANCE STATUS: 1 - Symptomatic but completely ambulatory  Vitals:   02/23/23 1355  BP: (!) 122/44  Pulse: 71  Resp: 18  Temp: 97.6 F (36.4 C)  SpO2: 99%   Filed Weights   02/23/23 1355  Weight: 90 lb 1.6 oz (40.9 kg)      LABORATORY DATA:  I have reviewed the data as listed    Latest Ref Rng & Units 02/23/2023    1:41 PM 12/01/2022   10:33 AM 11/04/2022    2:56 PM  CMP  Glucose 70 - 99 mg/dL 865  884  85   BUN 8 - 23 mg/dL 29  20  24    Creatinine 0.44 - 1.00 mg/dL 9.05  8.92  9.08   Sodium 135 - 145 mmol/L 141  139  143   Potassium 3.5 - 5.1 mmol/L 4.1  4.1  3.7   Chloride 98 - 111 mmol/L 105  104  105   CO2 22 - 32 mmol/L 31  29  32   Calcium 8.9 - 10.3 mg/dL 89.4  89.6  89.8   Total Protein 6.5 - 8.1 g/dL 6.8  6.9  6.9   Total Bilirubin 0.0 - 1.2 mg/dL 0.4   0.5  0.4   Alkaline Phos 38 - 126 U/L 157  119  113   AST 15 - 41 U/L 47  33  26   ALT 0 - 44 U/L 41  27  17     Lab Results  Component Value Date   WBC 5.3 02/23/2023   HGB 11.9 (L) 02/23/2023   HCT 34.3 (L) 02/23/2023   MCV 112.5 (H) 02/23/2023   PLT 204 02/23/2023   NEUTROABS 3.7 02/23/2023    ASSESSMENT & PLAN:  Malignant neoplasm of upper-outer quadrant of right breast in female, estrogen receptor positive (HCC) 07/01/2022:Right breast periareolar hardening and redness at 10 o'clock position measuring 1.7 cm with skin thickening extending to the nipple and an additional area 0.9 cm contiguous with the skin axilla, negative biopsy revealed grade 2 ILC ER 95% PR 20% Ki67 10%, HER2 negative 1+ by IHC.  Left breast calcifications retroareolar 9 o'clock position 2.6 cm biopsy was discordant excision recommended.    07/28/2022: CT CAP: Widespread bone metastases throughout the visualized bone structures, 6 mm right upper lobe pulm nodule, bilateral adrenal adenomas, 2.7 cm aortic aneurysm, emphysema 07/28/2022: Bone scan: Diffuse osseous metastatic disease ------------------------------------------------------------------------------------------------------------------------------------------------ Current treatment: Verzinio (started 08/06/2022) with letrozole  and Xgeva  every 3 months Verzinio toxicities: Very occasional diarrhea Slight fatigue Overall she tolerates Verzinio fairly well. She thinks that this tumor on the breast is shrinking significantly.   CT CAP 11/03/2022: Stable bone metastases.  Stable 6 mm right upper lobe nodule, stable left adrenal nodule, possible cystitis   Continue with the current treatment and recheck scans in 3 months. ------------------------------------- Assessment and Plan    Cancer Treatment Side Effects Monthly bouts of diarrhea managed with Imodium, leading to constipation. Stable energy levels and improved appetite. -Consider reducing Imodium  dose to half a tablet to manage diarrhea without causing constipation.  Recurrent Urinary Tract Infections Recent increase in frequency of UTIs, possibly due to vaginal dryness from antiestrogen treatment. -Prescribe topical estrogen cream to be applied around the urinary site twice weekly to potentially reduce UTI frequency.  Cancer Monitoring Stable tumor markers and blood counts. No adverse effects on liver or kidney function noted. Next CT scan scheduled in three months. -Continue current treatment regimen. -Encourage adequate hydration. -Schedule CT scan in three months. -Next appointment with Norleen in two months, and follow-up after the scan.  Missed Dose of Valine One missed dose due to accidental disposal. -No change in treatment plan. Continue Valine as prescribed.          Orders Placed This Encounter  Procedures   CT CHEST ABDOMEN PELVIS W CONTRAST    Standing Status:   Future    Expected Date:   05/24/2023    Expiration Date:   02/23/2024    If indicated for the ordered procedure, I authorize the administration of contrast media per Radiology protocol:   Yes    Does the patient have a contrast media/X-ray dye allergy?:   No    Preferred imaging location?:   [facility]    Release to patient:   Immediate    If indicated for the ordered procedure, I authorize the administration of oral contrast media per Radiology protocol:   Yes   The patient has a good understanding of the overall plan. she agrees with it. she will call with any problems that may develop before the next visit here. Total time spent: 30 mins including face to face time and time spent for planning, charting and co-ordination of care   Naomi MARLA Chad, MD 02/23/23

## 2023-02-23 NOTE — Assessment & Plan Note (Signed)
 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  Left breast calcifications retroareolar 9 o'clock position 2.6 cm biopsy was discordant excision recommended.    07/28/2022: CT CAP: Widespread bone metastases throughout the visualized bone structures, 6 mm right upper lobe pulm nodule, bilateral adrenal adenomas, 2.7 cm aortic aneurysm, emphysema 07/28/2022: Bone scan: Diffuse osseous metastatic disease ------------------------------------------------------------------------------------------------------------------------------------------------ Current treatment: Verzinio (started 08/06/2022) with letrozole  and Xgeva  every 3 months Verzinio toxicities: Very occasional diarrhea Slight fatigue Overall she tolerates Verzinio fairly well. She thinks that this tumor on the breast is shrinking significantly.   CT CAP 11/03/2022: Stable bone metastases.  Stable 6 mm right upper lobe nodule, stable left adrenal nodule, possible cystitis   Continue with the current treatment and recheck scans in 3 months.

## 2023-02-24 ENCOUNTER — Other Ambulatory Visit: Payer: Self-pay | Admitting: Cardiology

## 2023-02-24 LAB — CANCER ANTIGEN 27.29: CA 27.29: 54.2 U/mL — ABNORMAL HIGH (ref 0.0–38.6)

## 2023-02-24 LAB — CANCER ANTIGEN 15-3: CA 15-3: 46.2 U/mL — ABNORMAL HIGH (ref 0.0–25.0)

## 2023-03-05 ENCOUNTER — Other Ambulatory Visit: Payer: Self-pay

## 2023-03-12 ENCOUNTER — Other Ambulatory Visit: Payer: Self-pay | Admitting: Hematology and Oncology

## 2023-03-12 ENCOUNTER — Other Ambulatory Visit: Payer: Self-pay

## 2023-03-12 NOTE — Progress Notes (Signed)
Specialty Pharmacy Refill Coordination Note  Madison Richards is a 89 y.o. female contacted today regarding refills of specialty medication(s) Abemaciclib Madison Richards)   Patient requested Madison Richards at [facility] at [place] date: 03/17/23   Medication will be filled on 03/16/23 pending a refill request.

## 2023-03-13 ENCOUNTER — Other Ambulatory Visit: Payer: Self-pay

## 2023-03-13 MED ORDER — ABEMACICLIB 50 MG PO TABS
50.0000 mg | ORAL_TABLET | Freq: Two times a day (BID) | ORAL | 1 refills | Status: DC
  Filled 2023-03-13: qty 56, 28d supply, fill #0
  Filled 2023-04-07: qty 56, 28d supply, fill #1

## 2023-03-16 ENCOUNTER — Other Ambulatory Visit: Payer: Self-pay

## 2023-03-17 ENCOUNTER — Other Ambulatory Visit: Payer: Self-pay

## 2023-03-24 ENCOUNTER — Encounter: Payer: Self-pay | Admitting: Cardiology

## 2023-03-24 ENCOUNTER — Ambulatory Visit: Payer: Medicare PPO | Attending: Cardiology | Admitting: Cardiology

## 2023-03-24 ENCOUNTER — Other Ambulatory Visit: Payer: Self-pay | Admitting: Cardiology

## 2023-03-24 VITALS — BP 132/62 | HR 77 | Resp 16 | Ht 63.0 in | Wt 93.0 lb

## 2023-03-24 DIAGNOSIS — I739 Peripheral vascular disease, unspecified: Secondary | ICD-10-CM | POA: Diagnosis not present

## 2023-03-24 DIAGNOSIS — E7800 Pure hypercholesterolemia, unspecified: Secondary | ICD-10-CM

## 2023-03-24 DIAGNOSIS — I6523 Occlusion and stenosis of bilateral carotid arteries: Secondary | ICD-10-CM | POA: Diagnosis not present

## 2023-03-24 DIAGNOSIS — I10 Essential (primary) hypertension: Secondary | ICD-10-CM | POA: Diagnosis not present

## 2023-03-24 MED ORDER — PRAVASTATIN SODIUM 80 MG PO TABS: 80.0000 mg | ORAL_TABLET | Freq: Every day | ORAL | 3 refills | Status: AC

## 2023-03-24 MED ORDER — PRAVASTATIN SODIUM 80 MG PO TABS: 80.0000 mg | ORAL_TABLET | Freq: Every day | ORAL | 3 refills | Status: DC

## 2023-03-24 NOTE — Patient Instructions (Signed)
 Medication Instructions:  Increase Pravastatin  to 80 mg by mouth daily  *If you need a refill on your cardiac medications before your next appointment, please call your pharmacy*   Lab Work: none If you have labs (blood work) drawn today and your tests are completely normal, you will receive your results only by: MyChart Message (if you have MyChart) OR A paper copy in the mail If you have any lab test that is abnormal or we need to change your treatment, we will call you to review the results.   Testing/Procedures: none   Follow-Up: At [facility], you and your health needs are our priority.  As part of our continuing mission to provide you with exceptional heart care, we have created designated Provider Care Teams.  These Care Teams include your primary Cardiologist (physician) and Advanced Practice Providers (APPs -  Physician Assistants and Nurse Practitioners) who all work together to provide you with the care you need, when you need it.  We recommend signing up for the patient portal called MyChart.  Sign up information is provided on this After Visit Summary.  MyChart is used to connect with patients for Virtual Visits (Telemedicine).  Patients are able to view lab/test results, encounter notes, upcoming appointments, etc.  Non-urgent messages can be sent to your provider as well.   To learn more about what you can do with MyChart, go to [email or web address].    Your next appointment:   12 month(s)  Provider:   Gordy Bergamo, MD     Other Instructions

## 2023-03-24 NOTE — Addendum Note (Signed)
Addended by: Delrae Rend on: 03/24/2023 10:48 AM   Modules accepted: Orders

## 2023-03-24 NOTE — Progress Notes (Signed)
 Cardiology Office Note:  .   Date:  03/24/2023  ID:  [name], DOB 12/31/1934, MRN [id number] PCP: Cleotilde Planas, MD  [facility] Providers Cardiologist:  Gordy Bergamo, MD   History of Present Illness: .   Madison Richards is a 89 y.o. Caucasian female with peripheral arterial disease, hypertension, hyperlipidemia, abdominal aortic ectasia,, prior tobacco use disorder, she has had right common iliac artery aneurysm stenting with Viabahn covered stents on 04/06/2012 and on 05/26/2014 had thrombotic event after she had stopped her antiplatelet therapy and underwent thrombectomy. She has remained abstinant  with tobacco since Feb 2019. She had recurrent stroke in Dec 2019, fortunately right arm weakness has resolved and no dysarthria.  In the interim patient has been diagnosed with right breast cancer and is presently on chemotherapy therapy.  She presents here for a annual visit, symptoms of claudication are remained stable, no open wounds, denies chest pain, shortness of breath is stable.  No PND or orthopnea.  Has occasional leg edema.  Otherwise no specific complaints.     Labs    Lab Results  Component Value Date   NA 141 02/23/2023   K 4.1 02/23/2023   CO2 31 02/23/2023   GLUCOSE 134 (H) 02/23/2023   BUN 29 (H) 02/23/2023   CREATININE 0.94 02/23/2023   CALCIUM 10.5 (H) 02/23/2023   GFRNONAA 59 (L) 02/23/2023      Latest Ref Rng & Units 02/23/2023    1:41 PM 12/01/2022   10:33 AM 11/04/2022    2:56 PM  BMP  Glucose 70 - 99 mg/dL 865  884  85   BUN 8 - 23 mg/dL 29  20  24    Creatinine 0.44 - 1.00 mg/dL 9.05  8.92  9.08   Sodium 135 - 145 mmol/L 141  139  143   Potassium 3.5 - 5.1 mmol/L 4.1  4.1  3.7   Chloride 98 - 111 mmol/L 105  104  105   CO2 22 - 32 mmol/L 31  29  32   Calcium 8.9 - 10.3 mg/dL 89.4  89.6  89.8       Latest Ref Rng & Units 02/23/2023    1:41 PM 12/01/2022   10:33 AM 11/04/2022    2:56 PM  CBC  WBC 4.0 - 10.5 K/uL 5.3  5.9  5.5   Hemoglobin 12.0 -  15.0 g/dL 88.0  88.0  88.3   Hematocrit 36.0 - 46.0 % 34.3  35.3  33.5   Platelets 150 - 400 K/uL 204  211  225    External Labs:  PCP labs 11/04/2022:  Total cholesterol 186, triglycerides 160, HDL 80, LDL 88.  Review of Systems  Cardiovascular:  Positive for claudication, dyspnea on exertion and leg swelling (occasional ankle edema). Negative for chest pain.    Physical Exam:   VS:  BP 132/62 (BP Location: Left Arm, Patient Position: Sitting, Cuff Size: Normal)   Pulse 77   Resp 16   Ht 5' 3 (1.6 m)   Wt 93 lb (42.2 kg)   SpO2 93%   BMI 16.47 kg/m    Wt Readings from Last 3 Encounters:  03/24/23 93 lb (42.2 kg)  02/23/23 90 lb 1.6 oz (40.9 kg)  12/01/22 90 lb 1.6 oz (40.9 kg)     Physical Exam Constitutional:      Appearance: She is underweight.  Neck:     Vascular: Carotid bruit (bilateral) present. No JVD.  Cardiovascular:  Rate and Rhythm: Normal rate and regular rhythm.     Pulses: Intact distal pulses.          Femoral pulses are 2+ on the right side with bruit and 2+ on the left side with bruit.      Popliteal pulses are 0 on the right side and 0 on the left side.       Dorsalis pedis pulses are 0 on the right side and 0 on the left side.       Posterior tibial pulses are 0 on the right side and 0 on the left side.     Heart sounds: Normal heart sounds. No murmur heard.    No gallop.  Pulmonary:     Effort: Pulmonary effort is normal.     Breath sounds: Normal breath sounds.  Abdominal:     General: Bowel sounds are normal.     Palpations: Abdomen is soft.  Musculoskeletal:     Right lower leg: No edema.     Left lower leg: No edema.     Studies Reviewed: [name]     EKG:    EKG Interpretation Date/Time:  Tuesday March 24 2023 09:59:15 EST Ventricular Rate:  64 PR Interval:  240 QRS Duration:  68 QT Interval:  426 QTC Calculation: 439 R Axis:   -82  Text Interpretation: EKG 03/24/2023: Sinus rhythm with first-degree AV block at the rate of 64  bpm, left anterior fascicular block.  Poor R wave progression, cannot exclude anteroseptal infarct old.  Nonspecific diffuse sagging ST depression in anterolateral leads.  Compared to 02/26/2022, first-degree AV block new. Otherwise no significant change. Confirmed by Irvan Tiedt, Jagadeesh [phone]) on 03/24/2023 10:13:21 AM    EKG 01/24/2022: Normal sinus rhythm with rate of 62 bpm, left atrial enlargement, left axis deviation, left anterior fascicular block. Poor R wave progression, cannot exclude anteroseptal infarct old. Nonspecific T abnormality. PVCs (2).   Medications and allergies    Allergies  Allergen Reactions   Bacitracin-Polymyxin B     Other reaction(s): AREA APPLIED TO SPREAD   Fosamax [Alendronate Sodium]     Leg cramps   Clindamycin/Lincomycin Rash    Rash in mouth   Neosporin [Neomycin-Polymyxin-Gramicidin] Rash   Penicillins Rash    Rash in mouth     Current Outpatient Medications:    abemaciclib  (VERZENIO ) 50 MG tablet, Take 1 tablet (50 mg total) by mouth 2 (two) times daily., Disp: 56 tablet, Rfl: 1   amLODipine -valsartan  (EXFORGE ) 5-160 MG tablet, Take 1 tablet by mouth daily., Disp: 30 tablet, Rfl: 0   Calcium Carbonate-Vitamin D  (CALCIUM 600 + D PO), Take 1 tablet by mouth in the morning and at bedtime., Disp: , Rfl:    Cholecalciferol  (VITAMIN D3) 5000 UNITS TABS, Take 1 tablet by mouth every Monday, Wednesday, and Friday., Disp: , Rfl:    clopidogrel  (PLAVIX ) 75 MG tablet, Take 1 tablet (75 mg total) by mouth daily., Disp: 90 tablet, Rfl: 1   Coenzyme Q10 (COQ10 PO), Take 200 mg by mouth., Disp: , Rfl:    colestipol  (COLESTID ) 1 g tablet, Take 1 g by mouth daily. Take 2 pills daily, Disp: , Rfl:    denosumab  (XGEVA ) 120 MG/1.7ML SOLN injection, Inject 120 mg into the skin every 3 (three) months., Disp: , Rfl:    estradiol  (ESTRACE  VAGINAL) 0.1 MG/GM vaginal cream, Place 1 Applicatorful vaginally 2 (two) times a week., Disp: 42.5 g, Rfl: 1   ezetimibe  (ZETIA ) 10 MG  tablet, Take 10 mg  by mouth daily., Disp: , Rfl:    hydrochlorothiazide  (MICROZIDE ) 12.5 MG capsule, Take 1 capsule (12.5 mg total) by mouth daily. Please keep scheduled appointment for future refills. Thank you., Disp: 30 capsule, Rfl: 0   letrozole  (FEMARA ) 2.5 MG tablet, Take 1 tablet (2.5 mg total) by mouth daily., Disp: 90 tablet, Rfl: 3   loratadine  (CLARITIN ) 10 MG tablet, Take 10 mg by mouth daily as needed for allergies. , Disp: , Rfl:    metoprolol  tartrate (LOPRESSOR ) 25 MG tablet, TAKE 1 TABLET BY MOUTH TWICE A DAY, Disp: 180 tablet, Rfl: 3   Multiple Vitamin (MULTIVITAMIN WITH MINERALS) TABS, Take 1 tablet by mouth daily., Disp: , Rfl:    pravastatin  (PRAVACHOL ) 80 MG tablet, Take 1 tablet (80 mg total) by mouth at bedtime., Disp: 90 tablet, Rfl: 3   ASSESSMENT AND PLAN: .      ICD-10-CM   1. PAD (peripheral artery disease) (HCC)  I73.9 EKG 12-Lead    pravastatin  (PRAVACHOL ) 80 MG tablet    2. Hypercholesteremia  E78.00 pravastatin  (PRAVACHOL ) 80 MG tablet    3. Primary hypertension  I10     4. Atherosclerosis of both carotid arteries  I65.23       1. PAD (peripheral artery disease) (HCC) Patient symptoms of PAD has remained stable with symptoms of claudication also being stable with no evidence of any open wounds.  Capillary refill is <2 seconds.  She does have absent popliteal and pedal pulses however femoral pulses are well felt.  2. Hypercholesteremia Lipids are well-managed with the combination of colestipol  1 g twice daily, ezetimibe  10 mg daily, will increase pravastatin  from 40 mg to 80 mg daily in view of LDL being >70 and non-HDL cholesterol slightly upper limit of 106 with goal being <100.  3. Primary hypertension Blood pressure is well-controlled on amlodipine /valsartan  combination 5/160 daily, metoprolol  tartrate 25 mg twice daily, HCTZ 12.5 mg daily, continue the same.  She has new first-degree AV block but otherwise unremarkable EKG with nonspecific sagging ST  depressions that have been chronic.  4. Bilateral carotid atherosclerosis She has very prominent bilateral carotid artery bruit however carotid artery duplex in 2021 had not revealed any significant disease except mild disease of 16 to 49% on the left.  Will continue observation for now as the risk factors are well-controlled.  Assess Signed,  Gordy Bergamo, MD, [facility] 03/24/2023, 10:31 AM [facility] [address] [place], [place] [address] Phone: [phone]. Fax:  [phone]

## 2023-04-07 ENCOUNTER — Other Ambulatory Visit: Payer: Self-pay | Admitting: Pharmacy Technician

## 2023-04-07 ENCOUNTER — Other Ambulatory Visit: Payer: Self-pay

## 2023-04-07 NOTE — Progress Notes (Signed)
 Specialty Pharmacy Refill Coordination Note  Madison Richards is a 89 y.o. female contacted today regarding refills of specialty medication(s) Abemaciclib [name])   Patient requested [name] at [facility] at [place] date: 04/14/23   Medication will be filled on 04/13/23.

## 2023-04-13 ENCOUNTER — Other Ambulatory Visit: Payer: Self-pay

## 2023-05-04 ENCOUNTER — Other Ambulatory Visit (HOSPITAL_COMMUNITY): Payer: Self-pay

## 2023-05-04 ENCOUNTER — Other Ambulatory Visit: Payer: Self-pay | Admitting: Hematology and Oncology

## 2023-05-04 NOTE — Progress Notes (Addendum)
 Specialty Pharmacy Refill Coordination Note  Madison Richards is a 89 y.o. female contacted today regarding refills of specialty medication(s) Abemaciclib [name])   Patient requested [name] at [facility] at [place] date: 05/13/23   Medication will be filled on 05/12/23. This fill date is pending response to refill request from provider. Patient is aware and if they have not received fill by intended date they must follow up with pharmacy.

## 2023-05-04 NOTE — Progress Notes (Signed)
 Specialty Pharmacy Ongoing Clinical Assessment Note  Madison Richards is a 89 y.o. female who is being followed by the specialty pharmacy service for RxSp Oncology   Patient's specialty medication(s) reviewed today: Abemaciclib (VERZENIO)   Missed doses in the last 4 weeks: 0   Patient/Caregiver did not have any additional questions or concerns.   Therapeutic benefit summary: Patient is achieving benefit   Adverse events/side effects summary: No adverse events/side effects   Patient's therapy is appropriate to: Continue    Goals Addressed             This Visit's Progress    Slow Disease Progression   On track    Patient is on track. Patient will maintain adherence.  Last CT from 11/03/22 showed stable disease.          Follow up:  6 months  Servando Snare Specialty Pharmacist

## 2023-05-05 ENCOUNTER — Other Ambulatory Visit (HOSPITAL_COMMUNITY): Payer: Self-pay

## 2023-05-05 ENCOUNTER — Encounter: Payer: Self-pay | Admitting: Hematology and Oncology

## 2023-05-05 ENCOUNTER — Other Ambulatory Visit: Payer: Self-pay

## 2023-05-05 MED ORDER — ABEMACICLIB 50 MG PO TABS
50.0000 mg | ORAL_TABLET | Freq: Two times a day (BID) | ORAL | 5 refills | Status: DC
  Filled 2023-05-05: qty 56, 28d supply, fill #0
  Filled 2023-06-04: qty 56, 28d supply, fill #1
  Filled 2023-07-02: qty 56, 28d supply, fill #2
  Filled 2023-07-31: qty 56, 28d supply, fill #3
  Filled 2023-08-26: qty 56, 28d supply, fill #4
  Filled 2023-09-25 – 2023-10-02 (×4): qty 56, 28d supply, fill #5

## 2023-05-12 ENCOUNTER — Other Ambulatory Visit: Payer: Self-pay

## 2023-05-13 ENCOUNTER — Other Ambulatory Visit (HOSPITAL_COMMUNITY): Payer: Self-pay

## 2023-05-13 DIAGNOSIS — C50411 Malignant neoplasm of upper-outer quadrant of right female breast: Secondary | ICD-10-CM | POA: Diagnosis not present

## 2023-05-13 DIAGNOSIS — E7800 Pure hypercholesterolemia, unspecified: Secondary | ICD-10-CM | POA: Diagnosis not present

## 2023-05-13 DIAGNOSIS — I10 Essential (primary) hypertension: Secondary | ICD-10-CM | POA: Diagnosis not present

## 2023-05-13 DIAGNOSIS — Z23 Encounter for immunization: Secondary | ICD-10-CM | POA: Diagnosis not present

## 2023-05-13 DIAGNOSIS — Z681 Body mass index (BMI) 19 or less, adult: Secondary | ICD-10-CM | POA: Diagnosis not present

## 2023-05-13 DIAGNOSIS — N390 Urinary tract infection, site not specified: Secondary | ICD-10-CM | POA: Diagnosis not present

## 2023-05-13 DIAGNOSIS — M810 Age-related osteoporosis without current pathological fracture: Secondary | ICD-10-CM | POA: Diagnosis not present

## 2023-05-13 DIAGNOSIS — C7951 Secondary malignant neoplasm of bone: Secondary | ICD-10-CM | POA: Diagnosis not present

## 2023-05-18 ENCOUNTER — Inpatient Hospital Stay: Payer: Medicare PPO

## 2023-05-18 ENCOUNTER — Inpatient Hospital Stay: Payer: Medicare PPO | Attending: Hematology and Oncology | Admitting: Pharmacist

## 2023-05-18 ENCOUNTER — Inpatient Hospital Stay: Payer: Medicare PPO | Attending: Hematology and Oncology

## 2023-05-18 VITALS — BP 133/48 | HR 68 | Temp 97.3°F | Resp 18 | Ht 63.0 in | Wt 93.9 lb

## 2023-05-18 DIAGNOSIS — D3502 Benign neoplasm of left adrenal gland: Secondary | ICD-10-CM | POA: Diagnosis not present

## 2023-05-18 DIAGNOSIS — Z880 Allergy status to penicillin: Secondary | ICD-10-CM | POA: Diagnosis not present

## 2023-05-18 DIAGNOSIS — K760 Fatty (change of) liver, not elsewhere classified: Secondary | ICD-10-CM | POA: Diagnosis not present

## 2023-05-18 DIAGNOSIS — D3501 Benign neoplasm of right adrenal gland: Secondary | ICD-10-CM | POA: Insufficient documentation

## 2023-05-18 DIAGNOSIS — C50919 Malignant neoplasm of unspecified site of unspecified female breast: Secondary | ICD-10-CM

## 2023-05-18 DIAGNOSIS — C50411 Malignant neoplasm of upper-outer quadrant of right female breast: Secondary | ICD-10-CM

## 2023-05-18 DIAGNOSIS — M25552 Pain in left hip: Secondary | ICD-10-CM | POA: Diagnosis not present

## 2023-05-18 DIAGNOSIS — Z7902 Long term (current) use of antithrombotics/antiplatelets: Secondary | ICD-10-CM | POA: Insufficient documentation

## 2023-05-18 DIAGNOSIS — Z79811 Long term (current) use of aromatase inhibitors: Secondary | ICD-10-CM | POA: Insufficient documentation

## 2023-05-18 DIAGNOSIS — C7951 Secondary malignant neoplasm of bone: Secondary | ICD-10-CM | POA: Insufficient documentation

## 2023-05-18 DIAGNOSIS — J439 Emphysema, unspecified: Secondary | ICD-10-CM | POA: Insufficient documentation

## 2023-05-18 DIAGNOSIS — Z170 Estrogen receptor positive status [ER+]: Secondary | ICD-10-CM | POA: Insufficient documentation

## 2023-05-18 DIAGNOSIS — Z79899 Other long term (current) drug therapy: Secondary | ICD-10-CM | POA: Diagnosis not present

## 2023-05-18 DIAGNOSIS — Z881 Allergy status to other antibiotic agents status: Secondary | ICD-10-CM | POA: Insufficient documentation

## 2023-05-18 LAB — CBC WITH DIFFERENTIAL (CANCER CENTER ONLY)
Abs Immature Granulocytes: 0.02 10*3/uL (ref 0.00–0.07)
Basophils Absolute: 0.1 10*3/uL (ref 0.0–0.1)
Basophils Relative: 1 %
Eosinophils Absolute: 0.1 10*3/uL (ref 0.0–0.5)
Eosinophils Relative: 2 %
HCT: 36.1 % (ref 36.0–46.0)
Hemoglobin: 12 g/dL (ref 12.0–15.0)
Immature Granulocytes: 0 %
Lymphocytes Relative: 14 %
Lymphs Abs: 0.7 10*3/uL (ref 0.7–4.0)
MCH: 38.6 pg — ABNORMAL HIGH (ref 26.0–34.0)
MCHC: 33.2 g/dL (ref 30.0–36.0)
MCV: 116.1 fL — ABNORMAL HIGH (ref 80.0–100.0)
Monocytes Absolute: 0.5 10*3/uL (ref 0.1–1.0)
Monocytes Relative: 9 %
Neutro Abs: 3.6 10*3/uL (ref 1.7–7.7)
Neutrophils Relative %: 74 %
Platelet Count: 160 10*3/uL (ref 150–400)
RBC: 3.11 MIL/uL — ABNORMAL LOW (ref 3.87–5.11)
RDW: 14.6 % (ref 11.5–15.5)
WBC Count: 4.9 10*3/uL (ref 4.0–10.5)
nRBC: 0 % (ref 0.0–0.2)

## 2023-05-18 LAB — CMP (CANCER CENTER ONLY)
ALT: 25 U/L (ref 0–44)
AST: 40 U/L (ref 15–41)
Albumin: 4 g/dL (ref 3.5–5.0)
Alkaline Phosphatase: 163 U/L — ABNORMAL HIGH (ref 38–126)
Anion gap: 6 (ref 5–15)
BUN: 22 mg/dL (ref 8–23)
CO2: 30 mmol/L (ref 22–32)
Calcium: 10.2 mg/dL (ref 8.9–10.3)
Chloride: 106 mmol/L (ref 98–111)
Creatinine: 0.95 mg/dL (ref 0.44–1.00)
GFR, Estimated: 58 mL/min — ABNORMAL LOW (ref >60)
Glucose, Bld: 123 mg/dL — ABNORMAL HIGH (ref 70–99)
Potassium: 4 mmol/L (ref 3.5–5.1)
Sodium: 142 mmol/L (ref 135–145)
Total Bilirubin: 0.5 mg/dL (ref 0.0–1.2)
Total Protein: 6.9 g/dL (ref 6.5–8.1)

## 2023-05-18 MED ORDER — DENOSUMAB 120 MG/1.7ML ~~LOC~~ SOLN
120.0000 mg | Freq: Once | SUBCUTANEOUS | Status: AC
  Administered 2023-05-18: 120 mg via SUBCUTANEOUS
  Filled 2023-05-18: qty 1.7

## 2023-05-18 NOTE — Progress Notes (Signed)
 [facility]       Telephone: [phone]?Fax: [phone]   Oncology Clinical Pharmacist Practitioner Progress Note  Madison Richards is a 89 y.o. female with a diagnosis of breast cancer. They were contacted today via in-person visit. She is accompanied by her friend Madison Richards.   Current treatment regimen and start date Abemaciclib (08/09/22) Letrozole (08/06/22) Denosumab 120 mg (09/08/22)  Interval History She continues on abemaciclib 50 mg by mouth every 12 hours on days 1 to 28 of a 28-day cycle. This is being given in combination with letrozole and denosumab 120 mg . Therapy is planned to continue until disease progression or unacceptable toxicity.  Madison Richards was seen today by clinical pharmacy as a follow-up to her abemaciclib management.  She was last seen by clinical pharmacy on 12/01/22 and Madison Richards on 02/23/23.  Madison Richards is due for denosumab 120 mg today. Restaging scans are scheduled for 05/22/23  Response to Therapy Doing well. Some bilateral ankle swelling when on her feet. Still sees Madison Richards and will follow up yearly. Madison Richards as PCP. Having some new left hip pain. Restaging scans on 05/22/23.Loose stools controlled with loperamide. Drinking plenty of fluids. Pulse recheck was  68 bpm manually. Asymptomatic. Will continue to check home readings and contact Madison Richards if they continue to run low.  She will have telephone visit on 05/28/23 to discuss scans and see Madison Richards again in 12 weeks when next due for denosumab 120 mg.  Labs, vitals, treatment parameters, and manufacturer guidelines assessing toxicity were reviewed with Madison Richards today. Based on these values, patient is in agreement to continue abemaciclib therapy at this time.  Allergies Allergies  Allergen Reactions   Bacitracin-Polymyxin B     Other reaction(s): AREA APPLIED TO SPREAD   Fosamax [Alendronate Sodium]     Leg cramps   Clindamycin/Lincomycin Rash    Rash in mouth   Neosporin  [Neomycin-Polymyxin-Gramicidin] Rash   Penicillins Rash    Rash in mouth    Vitals    05/18/2023   11:41 AM 05/18/2023   11:17 AM 03/24/2023   10:00 AM  Oncology Vitals  Height  160 cm 160 cm  Weight  42.593 kg 42.185 kg  Weight (lbs)  93 lbs 14 oz 93 lbs  BMI  16.63 kg/m2 16.47 kg/m2  Temp  97.3 F (36.3 C)   Pulse Rate 68 120 77  BP  133/48 132/62  Resp  18 16  SpO2  100 % 93 %  BSA (m2)  1.38 m2 1.37 m2    Laboratory Data    Latest Ref Rng & Units 05/18/2023   10:42 AM 02/23/2023    1:41 PM 12/01/2022   10:33 AM  CBC EXTENDED  WBC 4.0 - 10.5 K/uL 4.9  5.3  5.9   RBC 3.87 - 5.11 MIL/uL 3.11  3.05  3.12   Hemoglobin 12.0 - 15.0 g/dL 29.5  62.1  30.8   HCT 36.0 - 46.0 % 36.1  34.3  35.3   Platelets 150 - 400 K/uL 160  204  211   NEUT# 1.7 - 7.7 K/uL 3.6  3.7  4.4   Lymph# 0.7 - 4.0 K/uL 0.7  0.8  0.8        Latest Ref Rng & Units 05/18/2023   10:42 AM 02/23/2023    1:41 PM 12/01/2022   10:33 AM  CMP  Glucose 70 - 99 mg/dL 657  846  962  BUN 8 - 23 mg/dL 22  29  20    Creatinine 0.44 - 1.00 mg/dL 1.61  0.96  0.45   Sodium 135 - 145 mmol/L 142  141  139   Potassium 3.5 - 5.1 mmol/L 4.0  4.1  4.1   Chloride 98 - 111 mmol/L 106  105  104   CO2 22 - 32 mmol/L 30  31  29    Calcium 8.9 - 10.3 mg/dL 40.9  81.1  91.4   Total Protein 6.5 - 8.1 g/dL 6.9  6.8  6.9   Total Bilirubin 0.0 - 1.2 mg/dL 0.5  0.4  0.5   Alkaline Phos 38 - 126 U/L 163  157  119   AST 15 - 41 U/L 40  47  33   ALT 0 - 44 U/L 25  41  27     No results found for: "MG" Lab Results  Component Value Date   CA2729 54.2 (H) 02/23/2023   CA2729 65.9 (H) 12/01/2022   CA2729 62.9 (H) 11/04/2022   Lab Results  Component Value Date   CAN153 46.2 (H) 02/23/2023   NWG956 50.9 (H) 12/01/2022   OZH086 52.0 (H) 11/04/2022    Adverse Effects Assessment Alk Phos: slightly elevated (Grade 1)  Adherence Assessment Madison Richards reports missing 0 doses over the past 4 weeks.   Reason for missed dose:  N/A Patient was re-educated on importance of adherence.   Access Assessment Madison Richards is currently receiving her abemaciclib through [employer] concerns: None  Medication Reconciliation The patient's medication list was reviewed today with the patient?  Yes New medications or herbal supplements have recently been started?  No Any medications have been discontinued?  No The medication list was updated and reconciled based on the patient's most recent medication list in the electronic medical record (EMR) including herbal products and OTC medications.   Medications Current Outpatient Medications  Medication Sig Dispense Refill   abemaciclib (VERZENIO) 50 MG tablet Take 1 tablet (50 mg total) by mouth 2 (two) times daily. 56 tablet 5   acetaminophen (TYLENOL) 500 MG tablet Take 500 mg by mouth every 6 (six) hours as needed. Taking as needed once daily     amLODipine-valsartan (EXFORGE) 5-160 MG tablet Take 1 tablet by mouth daily. 30 tablet 0   Calcium Carbonate-Vitamin D (CALCIUM 600 + D PO) Take 1 tablet by mouth in the morning and at bedtime.     Cholecalciferol (VITAMIN D3) 5000 UNITS TABS Take 1 tablet by mouth every Monday, Wednesday, and Friday.     clopidogrel (PLAVIX) 75 MG tablet Take 1 tablet (75 mg total) by mouth daily. 90 tablet 1   Coenzyme Q10 (COQ10 PO) Take 200 mg by mouth.     colestipol (COLESTID) 1 g tablet Take 1 g by mouth daily. Take 2 pills daily     denosumab (XGEVA) 120 MG/1.7ML SOLN injection Inject 120 mg into the skin every 3 (three) months.     estradiol (ESTRACE VAGINAL) 0.1 MG/GM vaginal cream Place 1 Applicatorful vaginally 2 (two) times a week. 42.5 g 1   ezetimibe (ZETIA) 10 MG tablet Take 10 mg by mouth daily.     fexofenadine (ALLEGRA) 180 MG tablet Take 180 mg by mouth daily.     hydrochlorothiazide (MICROZIDE) 12.5 MG capsule TAKE 1 CAPSULE(12.5 MG) BY MOUTH DAILY 90 capsule 3   letrozole (FEMARA) 2.5 MG tablet Take  1 tablet (2.5 mg total) by mouth daily. 90  tablet 3   metoprolol tartrate (LOPRESSOR) 25 MG tablet TAKE 1 TABLET BY MOUTH TWICE A DAY 180 tablet 3   Multiple Vitamin (MULTIVITAMIN WITH MINERALS) TABS Take 1 tablet by mouth daily.     pravastatin (PRAVACHOL) 80 MG tablet Take 1 tablet (80 mg total) by mouth at bedtime. 90 tablet 3   No current facility-administered medications for this visit.   Drug-Drug Interactions (DDIs) DDIs were evaluated?  Yes Significant DDIs?  No The patient was instructed to speak with their health care provider and/or the oral chemotherapy pharmacist before starting any new drug, including prescription or over the counter, natural / herbal products, or vitamins.  Supportive Care Diarrhea: we reviewed that diarrhea is common with abemaciclib and confirmed that she does have loperamide (Imodium) at home.  We reviewed how to take this medication PRN. Neutropenia: we discussed the importance of having a thermometer and what the Centers for Disease Control and Prevention (CDC) considers a fever which is 100.82F (38C) or higher.  Gave patient 24/7 triage line to call if any fevers or symptoms. ILD/Pneumonitis: we reviewed potential symptoms including cough, shortness, and fatigue.  VTE: reviewed signs of DVT such as leg swelling, redness, pain, or tenderness and signs of PE such as shortness of breath, rapid or irregular heartbeat, cough, chest pain, or lightheadedness. Reviewed to take the medication every 12 hours (with food sometimes can be easier on the stomach) and to take it at the same time every day. Hepatotoxicity: WNL Drug interactions with grapefruit products Monitor peripheral edema which may be from denosumab.   Dosing Assessment Hepatic adjustments needed?  No Renal adjustments needed?  No Toxicity adjustments needed?  No The current dosing regimen is appropriate to continue at this time.  Follow-Up Plan Continue abemaciclib 50 mg by mouth every 12  hours Continue letrozole 2.5 mg by mouth daily Continue denosumab 120 mg every 12 weeks. Due today, then again in 12 weeks. Use loperamide as needed for diarrhea Continue to follow with Madison Richards for hypertension. Sees again March 2026. Monitor bilateral foot swelling that worsens as the day goes on. Continue to follow with Madison Richards PCP. Sees again March 2026  Tumor markers will come back tomorrow Restaging scans scheduled for 05/22/23, telephone visit added with Madison Richards to review results on 05/28/23 Will add labs, Madison Richards visit, and denosumab 120 mg in 12 weeks Madison Richards can follow up with clinical pharmacy as deemed necessary by Dr. Serena Richards going forward   Madison Richards participated in the discussion, expressed understanding, and voiced agreement with the above plan. All questions were answered to her satisfaction. The patient was advised to contact the clinic at [phone] with any questions or concerns prior to her return visit.   I spent 30 minutes assessing and educating the patient.  Madison Richards A. Odetta Pink, PharmD, BCOP, CPP  Madison Richards, RPH-CPP, 05/18/2023  11:43 AM   **Disclaimer: This note was dictated with voice recognition software. Similar sounding words can inadvertently be transcribed and this note may contain transcription errors which may not have been corrected upon publication of note.**

## 2023-05-19 LAB — CANCER ANTIGEN 27.29: CA 27.29: 59.3 U/mL — ABNORMAL HIGH (ref 0.0–38.6)

## 2023-05-19 LAB — CANCER ANTIGEN 15-3: CA 15-3: 45.9 U/mL — ABNORMAL HIGH (ref 0.0–25.0)

## 2023-05-22 ENCOUNTER — Ambulatory Visit (HOSPITAL_COMMUNITY)
Admission: RE | Admit: 2023-05-22 | Discharge: 2023-05-22 | Disposition: A | Discharge status: Home / Self Care | Source: Ambulatory Visit | Attending: Hematology and Oncology | Admitting: Hematology and Oncology

## 2023-05-22 ENCOUNTER — Encounter (HOSPITAL_COMMUNITY): Payer: Self-pay

## 2023-05-22 DIAGNOSIS — C50911 Malignant neoplasm of unspecified site of right female breast: Secondary | ICD-10-CM | POA: Diagnosis not present

## 2023-05-22 DIAGNOSIS — Z170 Estrogen receptor positive status [ER+]: Secondary | ICD-10-CM | POA: Diagnosis not present

## 2023-05-22 DIAGNOSIS — C50411 Malignant neoplasm of upper-outer quadrant of right female breast: Secondary | ICD-10-CM | POA: Diagnosis not present

## 2023-05-22 DIAGNOSIS — J439 Emphysema, unspecified: Secondary | ICD-10-CM | POA: Diagnosis not present

## 2023-05-22 DIAGNOSIS — C7951 Secondary malignant neoplasm of bone: Secondary | ICD-10-CM | POA: Diagnosis not present

## 2023-05-22 DIAGNOSIS — C50919 Malignant neoplasm of unspecified site of unspecified female breast: Secondary | ICD-10-CM | POA: Diagnosis not present

## 2023-05-22 DIAGNOSIS — N200 Calculus of kidney: Secondary | ICD-10-CM | POA: Diagnosis not present

## 2023-05-22 MED ORDER — IOHEXOL 300 MG/ML  SOLN
100.0000 mL | Freq: Once | INTRAMUSCULAR | Status: DC | PRN
Start: 2023-05-22 — End: 2023-05-23

## 2023-05-22 MED ORDER — IOHEXOL 300 MG/ML  SOLN
80.0000 mL | Freq: Once | INTRAMUSCULAR | Status: AC | PRN
  Administered 2023-05-22: 80 mL via INTRAVENOUS

## 2023-05-22 MED ORDER — IOHEXOL 9 MG/ML PO SOLN
500.0000 mL | ORAL | Status: AC
  Administered 2023-05-22: 500 mL via ORAL

## 2023-05-27 ENCOUNTER — Other Ambulatory Visit: Payer: Self-pay | Admitting: Cardiology

## 2023-05-27 NOTE — Assessment & Plan Note (Signed)
 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  Left breast calcifications retroareolar 9 o'clock position 2.6 cm biopsy was discordant excision recommended.    07/28/2022: CT CAP: Widespread bone metastases throughout the visualized bone structures, 6 mm right upper lobe pulm nodule, bilateral adrenal adenomas, 2.7 cm aortic aneurysm, emphysema 07/28/2022: Bone scan: Diffuse osseous metastatic disease ------------------------------------------------------------------------------------------------------------------------------------------------ Current treatment: Verzinio (started 08/06/2022) with letrozole and Xgeva every 3 months Verzinio toxicities: Very occasional diarrhea Slight fatigue Overall she tolerates Verzinio fairly well. She thinks that this tumor on the breast is shrinking significantly.   CT CAP 11/03/2022: Stable bone metastases.  Stable 6 mm right upper lobe nodule, stable left adrenal nodule, possible cystitis CT CAP 05/22/2023: Stable bone metastasis, stable 6 mm right upper lobe lung nodule unchanged bilateral adrenal nodules benign improved bladder wall thickening   Continue with the current treatment and recheck scans in 6 months.

## 2023-05-28 ENCOUNTER — Inpatient Hospital Stay: Attending: Hematology and Oncology | Admitting: Hematology and Oncology

## 2023-05-28 DIAGNOSIS — C50411 Malignant neoplasm of upper-outer quadrant of right female breast: Secondary | ICD-10-CM | POA: Diagnosis not present

## 2023-05-28 DIAGNOSIS — Z170 Estrogen receptor positive status [ER+]: Secondary | ICD-10-CM

## 2023-05-28 DIAGNOSIS — M7989 Other specified soft tissue disorders: Secondary | ICD-10-CM

## 2023-05-28 NOTE — Progress Notes (Signed)
 HEMATOLOGY-ONCOLOGY TELEPHONE VISIT PROGRESS NOTE  I connected with our patient on 05/28/23 at 11:30 AM EDT by telephone and verified that I am speaking with the correct person using two identifiers.  I discussed the limitations, risks, security and privacy concerns of performing an evaluation and management service by telephone and the availability of in person appointments.  I also discussed with the patient that there may be a patient responsible charge related to this service. The patient expressed understanding and agreed to proceed.   History of Present Illness: Telephone follow-up to discuss results of recent CT scans  History of Present Illness The patient, with a history of bone metastases and a lung nodule from metastatic breast cancer, presents for a follow-up appointment. She reports no new symptoms and has not had a chance to review her recent CT scan results. There is also reported improvement in the bladder wall, which the patient attributes to the absence of recent urinary tract infections (UTIs).  The patient is currently on Verzenio, which she tolerates well, except for some nail cracking, breaking, and peeling. She also reports a pulse rate ranging from 59 to 66, which was a concern at her last appointment. However, she does not feel the need to discuss this with her primary care provider.  The patient has been using a vaginal cream since January, initially for a UTI, and then stopped after the infection cleared. At a recent check-up, her primary care provider recommended using the cream once a week as a preventive measure, which the patient is comfortable with.  The patient also reports swelling in both feet, with the left foot being worse. The swelling does not subside overnight and persists throughout the day. She has not experienced any pain with the swelling.    Oncology History  Malignant neoplasm of upper-outer quadrant of right breast in female, estrogen receptor  positive (HCC)  07/01/2022 Initial Diagnosis   Right breast periareolar hardening and redness at 10 o'clock position measuring 1.7 cm with skin thickening extending to the nipple and an additional area 0.9 cm contiguous with the skin axilla, negative biopsy revealed grade 2 ILC ER 95% PR 20% Ki67 10%, HER2 negative 1+ by IHC.  Left breast calcifications retroareolar 9 o'clock position 2.6 cm biopsy was discordant excision recommended.   07/16/2022 Cancer Staging   Staging form: Breast, AJCC 8th Edition - Clinical: Stage IIIB (cT4, cN0, cM0, G2, ER+, PR+, HER2-) - Signed by Serena Croissant, MD on 07/16/2022 Stage prefix: Initial diagnosis Histologic grading system: 3 grade system   07/23/2022 Genetic Testing   Negative [facility] Multi-Cancer + RNA Panel. 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 Report date is July 23, 2022.  The Multi-Cancer + RNA Panel offered by [facility] includes sequencing and/or deletion/duplication analysis of the following 70 genes:  AIP*, ALK, APC*, ATM*, AXIN2*, BAP1*, BARD1*, BLM*, BMPR1A*, BRCA1*, BRCA2*, BRIP1*, CDC73*, CDH1*, CDK4, CDKN1B*, CDKN2A, CHEK2*, CTNNA1*, DICER1*, EPCAM (del/dup only), EGFR, FH*, FLCN*, GREM1 (promoter dup only), HOXB13, KIT, LZTR1, MAX*, MBD4, MEN1*, MET, MITF, MLH1*, MSH2*, MSH3*, MSH6*, MUTYH*, NF1*, NF2*, NTHL1*, PALB2*, PDGFRA, PMS2*, POLD1*, POLE*, POT1*, PRKAR1A*, PTCH1*, PTEN*, RAD51C*, RAD51D*, RB1*, RET, SDHA* (sequencing only), SDHAF2*, SDHB*, SDHC*, SDHD*, SMAD4*, SMARCA4*, SMARCB1*, SMARCE1*, STK11*, SUFU*, TMEM127*, TP53*, TSC1*, TSC2*, VHL*. RNA analysis is performed for * genes.     REVIEW OF SYSTEMS:   Constitutional: Denies fevers, chills or abnormal weight loss All other systems were reviewed with the patient and are negative. Observations/Objective:     Assessment Plan:  Malignant neoplasm  of upper-outer quadrant of right breast in female, estrogen receptor positive (HCC) 07/01/2022:Right breast periareolar hardening and  redness at 10 o'clock position measuring 1.7 cm with skin thickening extending to the nipple and an additional area 0.9 cm contiguous with the skin axilla, negative biopsy revealed grade 2 ILC ER 95% PR 20% Ki67 10%, HER2 negative 1+ by IHC.  Left breast calcifications retroareolar 9 o'clock position 2.6 cm biopsy was discordant excision recommended.    07/28/2022: CT CAP: Widespread bone metastases throughout the visualized bone structures, 6 mm right upper lobe pulm nodule, bilateral adrenal adenomas, 2.7 cm aortic aneurysm, emphysema 07/28/2022: Bone scan: Diffuse osseous metastatic disease ------------------------------------------------------------------------------------------------------------------------------------------------ Current treatment: Verzinio (started 08/06/2022) with letrozole and Xgeva every 3 months Verzinio toxicities: Very occasional diarrhea Slight fatigue Brittle nails Overall she tolerates Verzinio fairly well. She thinks that this tumor on the breast is shrinking significantly.   Vaginal dryness and UTIs: On estrogen cream  CT CAP 11/03/2022: Stable bone metastases.  Stable 6 mm right upper lobe nodule, stable left adrenal nodule, possible cystitis CT CAP 05/22/2023: Stable bone metastasis, stable 6 mm right upper lobe lung nodule unchanged bilateral adrenal nodules benign improved bladder wall thickening   Leg swelling: I will obtain ultrasound of the lower extremities for further evaluation. Follow-up in June for injection lab and follow-up. Continue with the current treatment and recheck scans in 6 months.      I discussed the assessment and treatment plan with the patient. The patient was provided an opportunity to ask questions and all were answered. The patient agreed with the plan and demonstrated an understanding of the instructions. The patient was advised to call back or seek an in-person evaluation if the symptoms worsen or if the condition fails to improve  as anticipated.   I provided 20 minutes of non-face-to-face time during this encounter.  This includes time for charting and coordination of care   Tamsen Meek, MD

## 2023-06-01 ENCOUNTER — Ambulatory Visit (HOSPITAL_COMMUNITY)
Admission: RE | Admit: 2023-06-01 | Discharge: 2023-06-01 | Disposition: A | Discharge status: Home / Self Care | Source: Ambulatory Visit | Attending: Hematology and Oncology | Admitting: Hematology and Oncology

## 2023-06-01 DIAGNOSIS — M7989 Other specified soft tissue disorders: Secondary | ICD-10-CM | POA: Insufficient documentation

## 2023-06-02 ENCOUNTER — Other Ambulatory Visit: Payer: Self-pay

## 2023-06-04 ENCOUNTER — Other Ambulatory Visit: Payer: Self-pay | Admitting: Pharmacy Technician

## 2023-06-04 ENCOUNTER — Other Ambulatory Visit: Payer: Self-pay

## 2023-06-04 NOTE — Progress Notes (Signed)
 Specialty Pharmacy Refill Coordination Note  Madison Richards is a 89 y.o. female contacted today regarding refills of specialty medication(s) Abemaciclib (VERZENIO)   Patient requested [facility] at [facility] at [place] date: 06/09/23   Medication will be filled on 06/08/23.

## 2023-06-08 ENCOUNTER — Other Ambulatory Visit: Payer: Self-pay

## 2023-06-16 ENCOUNTER — Other Ambulatory Visit: Payer: Self-pay

## 2023-06-16 ENCOUNTER — Other Ambulatory Visit (HOSPITAL_COMMUNITY): Payer: Self-pay

## 2023-06-29 ENCOUNTER — Other Ambulatory Visit: Payer: Self-pay

## 2023-07-01 ENCOUNTER — Other Ambulatory Visit: Payer: Self-pay

## 2023-07-02 ENCOUNTER — Other Ambulatory Visit: Payer: Self-pay

## 2023-07-02 ENCOUNTER — Other Ambulatory Visit: Payer: Self-pay | Admitting: Pharmacy Technician

## 2023-07-02 NOTE — Progress Notes (Signed)
 Specialty Pharmacy Refill Coordination Note  Madison Richards is a 89 y.o. female contacted today regarding refills of specialty medication(s) Abemaciclib  (VERZENIO )   Patient requested [facility] at [facility] at [place] date: 07/06/23   Medication will be filled on 07/06/23.

## 2023-07-24 ENCOUNTER — Other Ambulatory Visit: Payer: Self-pay | Admitting: Cardiology

## 2023-07-26 ENCOUNTER — Other Ambulatory Visit: Payer: Self-pay | Admitting: Hematology and Oncology

## 2023-07-30 ENCOUNTER — Other Ambulatory Visit: Payer: Self-pay

## 2023-07-30 ENCOUNTER — Encounter (INDEPENDENT_AMBULATORY_CARE_PROVIDER_SITE_OTHER): Payer: Self-pay

## 2023-07-31 ENCOUNTER — Other Ambulatory Visit: Payer: Self-pay | Admitting: Pharmacy Technician

## 2023-07-31 ENCOUNTER — Other Ambulatory Visit: Payer: Self-pay

## 2023-07-31 NOTE — Progress Notes (Signed)
 Specialty Pharmacy Refill Coordination Note  Madison Richards is a 89 y.o. female contacted today regarding refills of specialty medication(s) Abemaciclib  (VERZENIO )   Patient requested [facility] at [facility] at [place] date: 08/05/23   Medication will be filled on 08/05/23.

## 2023-08-05 ENCOUNTER — Other Ambulatory Visit: Payer: Self-pay

## 2023-08-05 NOTE — Progress Notes (Signed)
 Second questionnaire submitted. Medication is already in transit to [facility] for pickup on 6/19

## 2023-08-12 ENCOUNTER — Ambulatory Visit

## 2023-08-12 ENCOUNTER — Ambulatory Visit: Admitting: Hematology and Oncology

## 2023-08-12 ENCOUNTER — Other Ambulatory Visit

## 2023-08-19 ENCOUNTER — Other Ambulatory Visit: Payer: Self-pay

## 2023-08-19 ENCOUNTER — Other Ambulatory Visit: Payer: Self-pay | Admitting: *Deleted

## 2023-08-19 DIAGNOSIS — Z170 Estrogen receptor positive status [ER+]: Secondary | ICD-10-CM

## 2023-08-19 NOTE — Assessment & Plan Note (Signed)
 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  Left breast calcifications retroareolar 9 o'clock position 2.6 cm biopsy was discordant excision recommended.    07/28/2022: CT CAP: Widespread bone metastases throughout the visualized bone structures, 6 mm right upper lobe pulm nodule, bilateral adrenal adenomas, 2.7 cm aortic aneurysm, emphysema 07/28/2022: Bone scan: Diffuse osseous metastatic disease ------------------------------------------------------------------------------------------------------------------------------------------------ Current treatment: Verzinio (started 08/06/2022) with letrozole  and Xgeva  every 3 months Verzinio toxicities: Very occasional diarrhea Slight fatigue Brittle nails Overall she tolerates Verzinio fairly well. She thinks that this tumor on the breast is shrinking significantly.   Vaginal dryness and UTIs: On estrogen cream   CT CAP 11/03/2022: Stable bone metastases.  Stable 6 mm right upper lobe nodule, stable left adrenal nodule, possible cystitis CT CAP 05/22/2023: Stable bone metastasis, stable 6 mm right upper lobe lung nodule unchanged bilateral adrenal nodules benign improved bladder wall thickening   Leg swelling: I will obtain ultrasound of the lower extremities for further evaluation. Follow-up in June for injection lab and follow-up. Continue with the current treatment and recheck scans in 6 months.

## 2023-08-20 ENCOUNTER — Inpatient Hospital Stay (HOSPITAL_BASED_OUTPATIENT_CLINIC_OR_DEPARTMENT_OTHER): Admitting: Hematology and Oncology

## 2023-08-20 ENCOUNTER — Inpatient Hospital Stay

## 2023-08-20 ENCOUNTER — Inpatient Hospital Stay: Attending: Hematology and Oncology

## 2023-08-20 VITALS — BP 147/81 | HR 94 | Temp 97.5°F | Resp 17 | Ht 63.0 in | Wt 96.1 lb

## 2023-08-20 DIAGNOSIS — Z7902 Long term (current) use of antithrombotics/antiplatelets: Secondary | ICD-10-CM | POA: Diagnosis not present

## 2023-08-20 DIAGNOSIS — Z170 Estrogen receptor positive status [ER+]: Secondary | ICD-10-CM

## 2023-08-20 DIAGNOSIS — D3501 Benign neoplasm of right adrenal gland: Secondary | ICD-10-CM | POA: Insufficient documentation

## 2023-08-20 DIAGNOSIS — D3502 Benign neoplasm of left adrenal gland: Secondary | ICD-10-CM | POA: Insufficient documentation

## 2023-08-20 DIAGNOSIS — C50411 Malignant neoplasm of upper-outer quadrant of right female breast: Secondary | ICD-10-CM | POA: Diagnosis present

## 2023-08-20 DIAGNOSIS — Z79899 Other long term (current) drug therapy: Secondary | ICD-10-CM | POA: Insufficient documentation

## 2023-08-20 DIAGNOSIS — R197 Diarrhea, unspecified: Secondary | ICD-10-CM | POA: Insufficient documentation

## 2023-08-20 DIAGNOSIS — Z881 Allergy status to other antibiotic agents status: Secondary | ICD-10-CM | POA: Diagnosis not present

## 2023-08-20 DIAGNOSIS — C50919 Malignant neoplasm of unspecified site of unspecified female breast: Secondary | ICD-10-CM | POA: Diagnosis not present

## 2023-08-20 DIAGNOSIS — C7951 Secondary malignant neoplasm of bone: Secondary | ICD-10-CM | POA: Diagnosis not present

## 2023-08-20 DIAGNOSIS — J439 Emphysema, unspecified: Secondary | ICD-10-CM | POA: Insufficient documentation

## 2023-08-20 DIAGNOSIS — Z79811 Long term (current) use of aromatase inhibitors: Secondary | ICD-10-CM | POA: Insufficient documentation

## 2023-08-20 DIAGNOSIS — Z880 Allergy status to penicillin: Secondary | ICD-10-CM | POA: Diagnosis not present

## 2023-08-20 LAB — CBC WITH DIFFERENTIAL (CANCER CENTER ONLY)
Abs Immature Granulocytes: 0.02 10*3/uL (ref 0.00–0.07)
Basophils Absolute: 0.1 10*3/uL (ref 0.0–0.1)
Basophils Relative: 1 %
Eosinophils Absolute: 0.1 10*3/uL (ref 0.0–0.5)
Eosinophils Relative: 1 %
HCT: 33.3 % — ABNORMAL LOW (ref 36.0–46.0)
Hemoglobin: 11.4 g/dL — ABNORMAL LOW (ref 12.0–15.0)
Immature Granulocytes: 0 %
Lymphocytes Relative: 11 %
Lymphs Abs: 0.6 10*3/uL — ABNORMAL LOW (ref 0.7–4.0)
MCH: 38.9 pg — ABNORMAL HIGH (ref 26.0–34.0)
MCHC: 34.2 g/dL (ref 30.0–36.0)
MCV: 113.7 fL — ABNORMAL HIGH (ref 80.0–100.0)
Monocytes Absolute: 0.6 10*3/uL (ref 0.1–1.0)
Monocytes Relative: 10 %
Neutro Abs: 4.4 10*3/uL (ref 1.7–7.7)
Neutrophils Relative %: 77 %
Platelet Count: 132 10*3/uL — ABNORMAL LOW (ref 150–400)
RBC: 2.93 MIL/uL — ABNORMAL LOW (ref 3.87–5.11)
RDW: 17.3 % — ABNORMAL HIGH (ref 11.5–15.5)
WBC Count: 5.8 10*3/uL (ref 4.0–10.5)
nRBC: 0 % (ref 0.0–0.2)

## 2023-08-20 LAB — CMP (CANCER CENTER ONLY)
ALT: 32 U/L (ref 0–44)
AST: 48 U/L — ABNORMAL HIGH (ref 15–41)
Albumin: 3.2 g/dL — ABNORMAL LOW (ref 3.5–5.0)
Alkaline Phosphatase: 208 U/L — ABNORMAL HIGH (ref 38–126)
Anion gap: 5 (ref 5–15)
BUN: 23 mg/dL (ref 8–23)
CO2: 33 mmol/L — ABNORMAL HIGH (ref 22–32)
Calcium: 10.2 mg/dL (ref 8.9–10.3)
Chloride: 105 mmol/L (ref 98–111)
Creatinine: 0.89 mg/dL (ref 0.44–1.00)
GFR, Estimated: >60 mL/min (ref >60)
Glucose, Bld: 97 mg/dL (ref 70–99)
Potassium: 4 mmol/L (ref 3.5–5.1)
Sodium: 143 mmol/L (ref 135–145)
Total Bilirubin: 0.6 mg/dL (ref 0.0–1.2)
Total Protein: 6 g/dL — ABNORMAL LOW (ref 6.5–8.1)

## 2023-08-20 MED ORDER — DENOSUMAB 120 MG/1.7ML ~~LOC~~ SOLN
120.0000 mg | Freq: Once | SUBCUTANEOUS | Status: AC
  Administered 2023-08-20: 120 mg via SUBCUTANEOUS
  Filled 2023-08-20: qty 1.7

## 2023-08-20 NOTE — Progress Notes (Signed)
 Patient Care Team: Cleotilde Planas, MD as PCP - General (Family Medicine) Ladona Heinz, MD as PCP - Cardiology (Cardiology) Odean Potts, MD as Consulting Physician (Hematology and Oncology) Dewey Rush, MD as Consulting Physician (Radiation Oncology) Curvin Deward MOULD, MD as Consulting Physician (General Surgery) Glean Stephane BROCKS, RN (Inactive) as Oncology Nurse Navigator Tyree Nanetta SAILOR, RN as Oncology Nurse Navigator Lucila Rush LABOR, RPH-CPP as Pharmacist (Hematology and Oncology)  DIAGNOSIS:  Encounter Diagnosis  Name Primary?   Malignant neoplasm of upper-outer quadrant of right breast in female, estrogen receptor positive (HCC) Yes    SUMMARY OF ONCOLOGIC HISTORY: Oncology History  Malignant neoplasm of upper-outer quadrant of right breast in female, estrogen receptor positive (HCC)  07/01/2022 Initial Diagnosis   Right breast periareolar hardening and redness at 10 o'clock position measuring 1.7 cm with skin thickening extending to the nipple and an additional area 0.9 cm contiguous with the skin axilla, negative biopsy revealed grade 2 ILC ER 95% PR 20% Ki67 10%, HER2 negative 1+ by IHC.  Left breast calcifications retroareolar 9 o'clock position 2.6 cm biopsy was discordant excision recommended.   07/16/2022 Cancer Staging   Staging form: Breast, AJCC 8th Edition - Clinical: Stage IIIB (cT4, cN0, cM0, G2, ER+, PR+, HER2-) - Signed by Odean Potts, MD on 07/16/2022 Stage prefix: Initial diagnosis Histologic grading system: 3 grade system   07/23/2022 Genetic Testing   Negative [facility] Multi-Cancer + RNA Panel. 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 Report date is July 23, 2022.  The Multi-Cancer + RNA Panel offered by [facility] includes sequencing and/or deletion/duplication analysis of the following 70 genes:  AIP*, ALK, APC*, ATM*, AXIN2*, BAP1*, BARD1*, BLM*, BMPR1A*, BRCA1*, BRCA2*, BRIP1*, CDC73*, CDH1*, CDK4, CDKN1B*, CDKN2A, CHEK2*, CTNNA1*, DICER1*, EPCAM (del/dup  only), EGFR, FH*, FLCN*, GREM1 (promoter dup only), HOXB13, KIT, LZTR1, MAX*, MBD4, MEN1*, MET, MITF, MLH1*, MSH2*, MSH3*, MSH6*, MUTYH*, NF1*, NF2*, NTHL1*, PALB2*, PDGFRA, PMS2*, POLD1*, POLE*, POT1*, PRKAR1A*, PTCH1*, PTEN*, RAD51C*, RAD51D*, RB1*, RET, SDHA* (sequencing only), SDHAF2*, SDHB*, SDHC*, SDHD*, SMAD4*, SMARCA4*, SMARCB1*, SMARCE1*, STK11*, SUFU*, TMEM127*, TP53*, TSC1*, TSC2*, VHL*. RNA analysis is performed for * genes.     CHIEF COMPLIANT: Follow up on Verzinio  HISTORY OF PRESENT ILLNESS:   History of Present Illness Madison Richards is an 89 year old female who presents with swelling in her legs and balance issues.  She experiences persistent swelling in her feet, ankles, and knees, which began after her first Xgeva  injection. The swelling initially subsided overnight but now only partially reduces. She attributes her balance issues to this swelling.  Intermittent diarrhea and loose stools occurred over the past year while on Verzenio , though not in the last couple of months. Her hemoglobin level is 11.4, and her albumin level has decreased to 3.2. An ultrasound of her legs in April was normal.  She experiences shortness of breath upon exertion and fatigue, feeling the need to sit down after walking across a room due to back weakness. There is a family history of heart problems in her mother and two brothers.     ALLERGIES:  is allergic to bacitracin-polymyxin b, fosamax [alendronate sodium], simvastatin, clindamycin/lincomycin, neosporin [neomycin-polymyxin-gramicidin], and penicillins.  MEDICATIONS:  Current Outpatient Medications  Medication Sig Dispense Refill   abemaciclib  (VERZENIO ) 50 MG tablet Take 1 tablet (50 mg total) by mouth 2 (two) times daily. 56 tablet 5   acetaminophen  (TYLENOL ) 500 MG tablet Take 500 mg by mouth every 6 (six) hours as needed. Taking as needed once daily  amLODipine -valsartan  (EXFORGE ) 5-160 MG tablet TAKE 1 TABLET BY MOUTH DAILY 90  tablet 3   Calcium Carbonate-Vitamin D  (CALCIUM 600 + D PO) Take 1 tablet by mouth in the morning and at bedtime.     Cholecalciferol  (VITAMIN D3) 5000 UNITS TABS Take 1 tablet by mouth every Monday, Wednesday, and Friday.     clopidogrel  (PLAVIX ) 75 MG tablet TAKE 1 TABLET BY MOUTH EVERY DAY 90 tablet 3   Coenzyme Q10 (COQ10 PO) Take 200 mg by mouth.     colestipol (COLESTID) 1 g tablet Take 1 g by mouth daily. Take 2 pills daily     denosumab  (XGEVA ) 120 MG/1.7ML SOLN injection Inject 120 mg into the skin every 3 (three) months.     estradiol  (ESTRACE  VAGINAL) 0.1 MG/GM vaginal cream Place 1 Applicatorful vaginally 2 (two) times a week. 42.5 g 1   ezetimibe  (ZETIA ) 10 MG tablet Take 10 mg by mouth daily.     fexofenadine (ALLEGRA) 180 MG tablet Take 180 mg by mouth daily.     hydrochlorothiazide  (MICROZIDE ) 12.5 MG capsule TAKE 1 CAPSULE(12.5 MG) BY MOUTH DAILY 90 capsule 3   letrozole  (FEMARA ) 2.5 MG tablet TAKE 1 TABLET BY MOUTH EVERY DAY 90 tablet 3   metoprolol  tartrate (LOPRESSOR ) 25 MG tablet TAKE 1 TABLET BY MOUTH TWICE A DAY 180 tablet 3   Multiple Vitamin (MULTIVITAMIN WITH MINERALS) TABS Take 1 tablet by mouth daily.     pravastatin  (PRAVACHOL ) 80 MG tablet Take 1 tablet (80 mg total) by mouth at bedtime. 90 tablet 3   No current facility-administered medications for this visit.    PHYSICAL EXAMINATION: ECOG PERFORMANCE STATUS: 1 - Symptomatic but completely ambulatory  Vitals:   08/20/23 1345  BP: (!) 147/81  Pulse: 94  Resp: 17  Temp: (!) 97.5 F (36.4 C)  SpO2: 94%   Filed Weights   08/20/23 1345  Weight: 96 lb 1.6 oz (43.6 kg)      LABORATORY DATA:  I have reviewed the data as listed    Latest Ref Rng & Units 08/20/2023    1:27 PM 05/18/2023   10:42 AM 02/23/2023    1:41 PM  CMP  Glucose 70 - 99 mg/dL 97  876  865   BUN 8 - 23 mg/dL 23  22  29    Creatinine 0.44 - 1.00 mg/dL 9.10  9.04  9.05   Sodium 135 - 145 mmol/L 143  142  141   Potassium 3.5 - 5.1  mmol/L 4.0  4.0  4.1   Chloride 98 - 111 mmol/L 105  106  105   CO2 22 - 32 mmol/L 33  30  31   Calcium 8.9 - 10.3 mg/dL 89.7  89.7  89.4   Total Protein 6.5 - 8.1 g/dL 6.0  6.9  6.8   Total Bilirubin 0.0 - 1.2 mg/dL 0.6  0.5  0.4   Alkaline Phos 38 - 126 U/L 208  163  157   AST 15 - 41 U/L 48  40  47   ALT 0 - 44 U/L 32  25  41     Lab Results  Component Value Date   WBC 5.8 08/20/2023   HGB 11.4 (L) 08/20/2023   HCT 33.3 (L) 08/20/2023   MCV 113.7 (H) 08/20/2023   PLT 132 (L) 08/20/2023   NEUTROABS 4.4 08/20/2023    ASSESSMENT & PLAN:  Malignant neoplasm of upper-outer quadrant of right breast in female, estrogen receptor positive (HCC) 07/01/2022:Right breast  periareolar hardening and redness at 10 o'clock position measuring 1.7 cm with skin thickening extending to the nipple and an additional area 0.9 cm contiguous with the skin axilla, negative biopsy revealed grade 2 ILC ER 95% PR 20% Ki67 10%, HER2 negative 1+ by IHC.  Left breast calcifications retroareolar 9 o'clock position 2.6 cm biopsy was discordant excision recommended.    07/28/2022: CT CAP: Widespread bone metastases throughout the visualized bone structures, 6 mm right upper lobe pulm nodule, bilateral adrenal adenomas, 2.7 cm aortic aneurysm, emphysema 07/28/2022: Bone scan: Diffuse osseous metastatic disease ------------------------------------------------------------------------------------------------------------------------------------------------ Current treatment: Verzinio (started 08/06/2022) with letrozole  and Xgeva  every 3 months Verzinio toxicities: Very occasional diarrhea Slight fatigue Brittle nails Overall she tolerates Verzinio fairly well. She thinks that this tumor on the breast is shrinking significantly.   Vaginal dryness and UTIs: On estrogen cream   CT CAP 11/03/2022: Stable bone metastases.  Stable 6 mm right upper lobe nodule, stable left adrenal nodule, possible cystitis CT CAP 05/22/2023:  Stable bone metastasis, stable 6 mm right upper lobe lung nodule unchanged bilateral adrenal nodules benign improved bladder wall thickening   Leg swelling: I will obtain ultrasound of the lower extremities for further evaluation. Follow-up in June for injection lab and follow-up. Continue with the current treatment and recheck scans in 6 months. ------------------------------------- Assessment and Plan Assessment & Plan Bone metastases Bone metastases managed with Xgeva . Reports persistent leg swelling, possibly linked to Xgeva . Hemoglobin and kidney function normal. Low albumin suggests nutritional issue. Echocardiogram needed to rule out cardiac causes. - Order echocardiogram to assess cardiac function. - Encourage increased dietary protein intake to address low albumin levels. - Continue Xgeva  injections every three months.  Leg swelling Persistent leg swelling from feet to knees. Cardiac issues, anemia, and kidney function considered. Low albumin suggests nutritional cause. Echocardiogram planned to assess cardiac function. - Order echocardiogram to assess cardiac function. - Encourage increased dietary protein intake to address low albumin levels.  Fatigue Significant fatigue possibly related to Verzenio , cardiac issues, or muscle mass loss. Hemoglobin normal. Echocardiogram planned to assess cardiac function. Muscle weakness considered a factor. - Order echocardiogram to assess cardiac function. - Encourage physical activity to maintain muscle mass.      No orders of the defined types were placed in this encounter.  The patient has a good understanding of the overall plan. she agrees with it. she will call with any problems that may develop before the next visit here. Total time spent: 30 mins including face to face time and time spent for planning, charting and co-ordination of care   Viinay K Tyeisha Dinan, MD 08/20/23

## 2023-08-21 LAB — CANCER ANTIGEN 27.29: CA 27.29: 69 U/mL — ABNORMAL HIGH (ref 0.0–38.6)

## 2023-08-21 LAB — CANCER ANTIGEN 15-3: CA 15-3: 51.8 U/mL — ABNORMAL HIGH (ref 0.0–25.0)

## 2023-08-25 ENCOUNTER — Encounter (INDEPENDENT_AMBULATORY_CARE_PROVIDER_SITE_OTHER): Payer: Self-pay

## 2023-08-26 ENCOUNTER — Other Ambulatory Visit: Payer: Self-pay | Admitting: Pharmacy Technician

## 2023-08-26 ENCOUNTER — Other Ambulatory Visit: Payer: Self-pay

## 2023-08-26 NOTE — Progress Notes (Signed)
 Specialty Pharmacy Refill Coordination Note  Madison Richards is a 89 y.o. female contacted today regarding refills of specialty medication(s) Abemaciclib  (VERZENIO )   Patient requested (Patient-Rptd) Pickup at [facility] at [facility] date: (Patient-Rptd) 08/31/23   Medication will be filled on 08/28/2023.

## 2023-08-27 ENCOUNTER — Other Ambulatory Visit: Payer: Self-pay

## 2023-09-15 ENCOUNTER — Ambulatory Visit (HOSPITAL_COMMUNITY)
Admission: RE | Admit: 2023-09-15 | Discharge: 2023-09-15 | Disposition: A | Discharge status: Home / Self Care | Source: Ambulatory Visit | Attending: Hematology and Oncology

## 2023-09-15 DIAGNOSIS — C50411 Malignant neoplasm of upper-outer quadrant of right female breast: Secondary | ICD-10-CM | POA: Insufficient documentation

## 2023-09-15 DIAGNOSIS — Z01818 Encounter for other preprocedural examination: Secondary | ICD-10-CM | POA: Insufficient documentation

## 2023-09-15 DIAGNOSIS — I371 Nonrheumatic pulmonary valve insufficiency: Secondary | ICD-10-CM | POA: Diagnosis not present

## 2023-09-15 DIAGNOSIS — Z8673 Personal history of transient ischemic attack (TIA), and cerebral infarction without residual deficits: Secondary | ICD-10-CM | POA: Insufficient documentation

## 2023-09-15 DIAGNOSIS — J90 Pleural effusion, not elsewhere classified: Secondary | ICD-10-CM | POA: Diagnosis not present

## 2023-09-15 DIAGNOSIS — I083 Combined rheumatic disorders of mitral, aortic and tricuspid valves: Secondary | ICD-10-CM | POA: Diagnosis not present

## 2023-09-15 DIAGNOSIS — Z170 Estrogen receptor positive status [ER+]: Secondary | ICD-10-CM | POA: Diagnosis not present

## 2023-09-15 DIAGNOSIS — Z0189 Encounter for other specified special examinations: Secondary | ICD-10-CM

## 2023-09-15 DIAGNOSIS — I10 Essential (primary) hypertension: Secondary | ICD-10-CM | POA: Insufficient documentation

## 2023-09-15 LAB — ECHOCARDIOGRAM COMPLETE
Area-P 1/2: 3.12 cm2
Calc EF: 73.9 %
MV VTI: 1.89 cm2
S' Lateral: 2.3 cm
Single Plane A2C EF: 71.7 %
Single Plane A4C EF: 77.7 %

## 2023-09-15 NOTE — Progress Notes (Signed)
  Echocardiogram 2D Echocardiogram has been performed.  Madison Richards 09/15/2023, 1:53 PM

## 2023-09-17 ENCOUNTER — Telehealth: Payer: Self-pay | Admitting: *Deleted

## 2023-09-17 NOTE — Telephone Encounter (Signed)
 Received call from pt requesting to review recent echocardiogram results with MD. Persistent leg swelling from feet to knees. RN will alert MD to contact pt.

## 2023-09-18 ENCOUNTER — Other Ambulatory Visit: Payer: Self-pay

## 2023-09-21 ENCOUNTER — Inpatient Hospital Stay (HOSPITAL_COMMUNITY)
Admission: EM | Admit: 2023-09-21 | Discharge: 2023-09-29 | DRG: 291 | Disposition: A | Discharge status: Home Health | Source: Ambulatory Visit | Attending: Internal Medicine | Admitting: Internal Medicine

## 2023-09-21 ENCOUNTER — Emergency Department (HOSPITAL_COMMUNITY)

## 2023-09-21 ENCOUNTER — Telehealth: Payer: Self-pay | Admitting: Hematology and Oncology

## 2023-09-21 ENCOUNTER — Other Ambulatory Visit: Payer: Self-pay

## 2023-09-21 ENCOUNTER — Encounter (HOSPITAL_COMMUNITY): Payer: Self-pay

## 2023-09-21 DIAGNOSIS — E785 Hyperlipidemia, unspecified: Secondary | ICD-10-CM | POA: Diagnosis present

## 2023-09-21 DIAGNOSIS — I739 Peripheral vascular disease, unspecified: Secondary | ICD-10-CM | POA: Diagnosis present

## 2023-09-21 DIAGNOSIS — R918 Other nonspecific abnormal finding of lung field: Secondary | ICD-10-CM | POA: Diagnosis not present

## 2023-09-21 DIAGNOSIS — Z8041 Family history of malignant neoplasm of ovary: Secondary | ICD-10-CM

## 2023-09-21 DIAGNOSIS — J449 Chronic obstructive pulmonary disease, unspecified: Secondary | ICD-10-CM | POA: Diagnosis not present

## 2023-09-21 DIAGNOSIS — I700 Atherosclerosis of aorta: Secondary | ICD-10-CM | POA: Diagnosis present

## 2023-09-21 DIAGNOSIS — J9601 Acute respiratory failure with hypoxia: Secondary | ICD-10-CM | POA: Diagnosis present

## 2023-09-21 DIAGNOSIS — I071 Rheumatic tricuspid insufficiency: Secondary | ICD-10-CM

## 2023-09-21 DIAGNOSIS — I493 Ventricular premature depolarization: Secondary | ICD-10-CM | POA: Diagnosis present

## 2023-09-21 DIAGNOSIS — Z7901 Long term (current) use of anticoagulants: Secondary | ICD-10-CM

## 2023-09-21 DIAGNOSIS — I5033 Acute on chronic diastolic (congestive) heart failure: Secondary | ICD-10-CM | POA: Diagnosis present

## 2023-09-21 DIAGNOSIS — I272 Pulmonary hypertension, unspecified: Secondary | ICD-10-CM | POA: Diagnosis present

## 2023-09-21 DIAGNOSIS — Z79899 Other long term (current) drug therapy: Secondary | ICD-10-CM

## 2023-09-21 DIAGNOSIS — I10 Essential (primary) hypertension: Secondary | ICD-10-CM | POA: Diagnosis present

## 2023-09-21 DIAGNOSIS — C7951 Secondary malignant neoplasm of bone: Secondary | ICD-10-CM | POA: Diagnosis present

## 2023-09-21 DIAGNOSIS — J918 Pleural effusion in other conditions classified elsewhere: Secondary | ICD-10-CM | POA: Diagnosis present

## 2023-09-21 DIAGNOSIS — J439 Emphysema, unspecified: Secondary | ICD-10-CM | POA: Diagnosis present

## 2023-09-21 DIAGNOSIS — I444 Left anterior fascicular block: Secondary | ICD-10-CM | POA: Diagnosis present

## 2023-09-21 DIAGNOSIS — I480 Paroxysmal atrial fibrillation: Secondary | ICD-10-CM | POA: Diagnosis present

## 2023-09-21 DIAGNOSIS — R059 Cough, unspecified: Secondary | ICD-10-CM | POA: Diagnosis not present

## 2023-09-21 DIAGNOSIS — Z880 Allergy status to penicillin: Secondary | ICD-10-CM

## 2023-09-21 DIAGNOSIS — R0602 Shortness of breath: Secondary | ICD-10-CM | POA: Diagnosis not present

## 2023-09-21 DIAGNOSIS — R040 Epistaxis: Secondary | ICD-10-CM | POA: Diagnosis present

## 2023-09-21 DIAGNOSIS — I2721 Secondary pulmonary arterial hypertension: Secondary | ICD-10-CM | POA: Diagnosis not present

## 2023-09-21 DIAGNOSIS — R600 Localized edema: Secondary | ICD-10-CM | POA: Diagnosis present

## 2023-09-21 DIAGNOSIS — Z681 Body mass index (BMI) 19 or less, adult: Secondary | ICD-10-CM | POA: Diagnosis not present

## 2023-09-21 DIAGNOSIS — Z8249 Family history of ischemic heart disease and other diseases of the circulatory system: Secondary | ICD-10-CM | POA: Diagnosis not present

## 2023-09-21 DIAGNOSIS — J90 Pleural effusion, not elsewhere classified: Secondary | ICD-10-CM | POA: Diagnosis not present

## 2023-09-21 DIAGNOSIS — Z7902 Long term (current) use of antithrombotics/antiplatelets: Secondary | ICD-10-CM

## 2023-09-21 DIAGNOSIS — M810 Age-related osteoporosis without current pathological fracture: Secondary | ICD-10-CM | POA: Diagnosis present

## 2023-09-21 DIAGNOSIS — Z87891 Personal history of nicotine dependence: Secondary | ICD-10-CM | POA: Diagnosis not present

## 2023-09-21 DIAGNOSIS — I4891 Unspecified atrial fibrillation: Secondary | ICD-10-CM

## 2023-09-21 DIAGNOSIS — I110 Hypertensive heart disease with heart failure: Principal | ICD-10-CM | POA: Diagnosis present

## 2023-09-21 DIAGNOSIS — C50411 Malignant neoplasm of upper-outer quadrant of right female breast: Secondary | ICD-10-CM | POA: Diagnosis present

## 2023-09-21 DIAGNOSIS — I6522 Occlusion and stenosis of left carotid artery: Secondary | ICD-10-CM | POA: Diagnosis present

## 2023-09-21 DIAGNOSIS — E43 Unspecified severe protein-calorie malnutrition: Secondary | ICD-10-CM | POA: Diagnosis present

## 2023-09-21 DIAGNOSIS — Z79811 Long term (current) use of aromatase inhibitors: Secondary | ICD-10-CM

## 2023-09-21 DIAGNOSIS — R262 Difficulty in walking, not elsewhere classified: Secondary | ICD-10-CM | POA: Diagnosis present

## 2023-09-21 DIAGNOSIS — C801 Malignant (primary) neoplasm, unspecified: Secondary | ICD-10-CM | POA: Diagnosis not present

## 2023-09-21 DIAGNOSIS — I509 Heart failure, unspecified: Secondary | ICD-10-CM | POA: Diagnosis not present

## 2023-09-21 DIAGNOSIS — I2722 Pulmonary hypertension due to left heart disease: Secondary | ICD-10-CM | POA: Diagnosis present

## 2023-09-21 DIAGNOSIS — J9811 Atelectasis: Secondary | ICD-10-CM | POA: Diagnosis present

## 2023-09-21 DIAGNOSIS — M7989 Other specified soft tissue disorders: Secondary | ICD-10-CM | POA: Diagnosis present

## 2023-09-21 DIAGNOSIS — Z888 Allergy status to other drugs, medicaments and biological substances status: Secondary | ICD-10-CM

## 2023-09-21 DIAGNOSIS — M542 Cervicalgia: Secondary | ICD-10-CM | POA: Diagnosis present

## 2023-09-21 DIAGNOSIS — Z48813 Encounter for surgical aftercare following surgery on the respiratory system: Secondary | ICD-10-CM | POA: Diagnosis not present

## 2023-09-21 DIAGNOSIS — Z170 Estrogen receptor positive status [ER+]: Secondary | ICD-10-CM | POA: Diagnosis not present

## 2023-09-21 DIAGNOSIS — Z8673 Personal history of transient ischemic attack (TIA), and cerebral infarction without residual deficits: Secondary | ICD-10-CM | POA: Diagnosis not present

## 2023-09-21 DIAGNOSIS — Z808 Family history of malignant neoplasm of other organs or systems: Secondary | ICD-10-CM

## 2023-09-21 DIAGNOSIS — Z881 Allergy status to other antibiotic agents status: Secondary | ICD-10-CM

## 2023-09-21 DIAGNOSIS — Z803 Family history of malignant neoplasm of breast: Secondary | ICD-10-CM

## 2023-09-21 LAB — CBC WITH DIFFERENTIAL/PLATELET
Abs Immature Granulocytes: 0.03 K/uL (ref 0.00–0.07)
Basophils Absolute: 0.1 K/uL (ref 0.0–0.1)
Basophils Relative: 1 %
Eosinophils Absolute: 0.1 K/uL (ref 0.0–0.5)
Eosinophils Relative: 1 %
HCT: 33.4 % — ABNORMAL LOW (ref 36.0–46.0)
Hemoglobin: 11.1 g/dL — ABNORMAL LOW (ref 12.0–15.0)
Immature Granulocytes: 0 %
Lymphocytes Relative: 5 %
Lymphs Abs: 0.4 K/uL — ABNORMAL LOW (ref 0.7–4.0)
MCH: 39.2 pg — ABNORMAL HIGH (ref 26.0–34.0)
MCHC: 33.2 g/dL (ref 30.0–36.0)
MCV: 118 fL — ABNORMAL HIGH (ref 80.0–100.0)
Monocytes Absolute: 0.7 K/uL (ref 0.1–1.0)
Monocytes Relative: 9 %
Neutro Abs: 6.2 K/uL (ref 1.7–7.7)
Neutrophils Relative %: 84 %
Platelets: 152 K/uL (ref 150–400)
RBC: 2.83 MIL/uL — ABNORMAL LOW (ref 3.87–5.11)
RDW: 17.4 % — ABNORMAL HIGH (ref 11.5–15.5)
Smear Review: NORMAL
WBC: 7.4 K/uL (ref 4.0–10.5)
nRBC: 0 % (ref 0.0–0.2)

## 2023-09-21 LAB — COMPREHENSIVE METABOLIC PANEL WITH GFR
ALT: 22 U/L (ref 0–44)
AST: 42 U/L — ABNORMAL HIGH (ref 15–41)
Albumin: 2.2 g/dL — ABNORMAL LOW (ref 3.5–5.0)
Alkaline Phosphatase: 161 U/L — ABNORMAL HIGH (ref 38–126)
Anion gap: 9 (ref 5–15)
BUN: 31 mg/dL — ABNORMAL HIGH (ref 8–23)
CO2: 27 mmol/L (ref 22–32)
Calcium: 9.6 mg/dL (ref 8.9–10.3)
Chloride: 106 mmol/L (ref 98–111)
Creatinine, Ser: 0.88 mg/dL (ref 0.44–1.00)
GFR, Estimated: >60 mL/min (ref >60)
Glucose, Bld: 119 mg/dL — ABNORMAL HIGH (ref 70–99)
Potassium: 3.4 mmol/L — ABNORMAL LOW (ref 3.5–5.1)
Sodium: 142 mmol/L (ref 135–145)
Total Bilirubin: 0.9 mg/dL (ref 0.0–1.2)
Total Protein: 5.4 g/dL — ABNORMAL LOW (ref 6.5–8.1)

## 2023-09-21 LAB — BRAIN NATRIURETIC PEPTIDE: B Natriuretic Peptide: 351.5 pg/mL — ABNORMAL HIGH (ref 0.0–100.0)

## 2023-09-21 LAB — MAGNESIUM: Magnesium: 1.7 mg/dL (ref 1.7–2.4)

## 2023-09-21 LAB — TSH: TSH: 3.798 u[IU]/mL (ref 0.350–4.500)

## 2023-09-21 LAB — T4, FREE: Free T4: 1.14 ng/dL — ABNORMAL HIGH (ref 0.61–1.12)

## 2023-09-21 MED ORDER — EZETIMIBE 10 MG PO TABS
10.0000 mg | ORAL_TABLET | Freq: Every day | ORAL | Status: DC
  Administered 2023-09-22 – 2023-09-29 (×10): 10 mg via ORAL
  Filled 2023-09-21 (×8): qty 1

## 2023-09-21 MED ORDER — LETROZOLE 2.5 MG PO TABS
2.5000 mg | ORAL_TABLET | Freq: Every day | ORAL | Status: DC
  Administered 2023-09-22 – 2023-09-29 (×10): 2.5 mg via ORAL
  Filled 2023-09-21 (×8): qty 1

## 2023-09-21 MED ORDER — APIXABAN 2.5 MG PO TABS
2.5000 mg | ORAL_TABLET | Freq: Two times a day (BID) | ORAL | Status: DC
  Administered 2023-09-21 – 2023-09-29 (×19): 2.5 mg via ORAL
  Filled 2023-09-21 (×16): qty 1

## 2023-09-21 MED ORDER — COLESTIPOL HCL 1 G PO TABS
1.0000 g | ORAL_TABLET | Freq: Every day | ORAL | Status: DC
  Administered 2023-09-22 – 2023-09-29 (×10): 1 g via ORAL
  Filled 2023-09-21 (×8): qty 1

## 2023-09-21 MED ORDER — AMLODIPINE BESYLATE-VALSARTAN 5-160 MG PO TABS: 1.0000 | ORAL_TABLET | Freq: Every day | ORAL | Status: DC

## 2023-09-21 MED ORDER — FUROSEMIDE 10 MG/ML IJ SOLN
40.0000 mg | Freq: Once | INTRAMUSCULAR | Status: DC
  Filled 2023-09-21: qty 4

## 2023-09-21 MED ORDER — ACETAMINOPHEN 650 MG RE SUPP: 650.0000 mg | Freq: Four times a day (QID) | RECTAL | Status: DC | PRN

## 2023-09-21 MED ORDER — POTASSIUM CHLORIDE CRYS ER 20 MEQ PO TBCR
40.0000 meq | EXTENDED_RELEASE_TABLET | Freq: Once | ORAL | Status: AC
Start: 2023-09-21 — End: 2023-09-21
  Administered 2023-09-21: 40 meq via ORAL
  Filled 2023-09-21: qty 2

## 2023-09-21 MED ORDER — PRAVASTATIN SODIUM 40 MG PO TABS
80.0000 mg | ORAL_TABLET | Freq: Every day | ORAL | Status: DC
  Administered 2023-09-21 – 2023-09-28 (×9): 80 mg via ORAL
  Filled 2023-09-21 (×8): qty 2

## 2023-09-21 MED ORDER — POLYETHYLENE GLYCOL 3350 17 G PO PACK
17.0000 g | PACK | Freq: Every day | ORAL | Status: DC | PRN
  Administered 2023-09-22: 17 g via ORAL
  Filled 2023-09-21: qty 1

## 2023-09-21 MED ORDER — IRBESARTAN 300 MG PO TABS: 150.0000 mg | ORAL_TABLET | Freq: Every day | ORAL | Status: DC

## 2023-09-21 MED ORDER — HYDROCHLOROTHIAZIDE 12.5 MG PO TABS: 12.5000 mg | ORAL_TABLET | Freq: Every day | ORAL | Status: DC

## 2023-09-21 MED ORDER — AMLODIPINE BESYLATE 5 MG PO TABS: 5.0000 mg | ORAL_TABLET | Freq: Every day | ORAL | Status: DC

## 2023-09-21 MED ORDER — ENOXAPARIN SODIUM 40 MG/0.4ML IJ SOSY: 40.0000 mg | PREFILLED_SYRINGE | INTRAMUSCULAR | Status: DC

## 2023-09-21 MED ORDER — FUROSEMIDE 10 MG/ML IJ SOLN
20.0000 mg | Freq: Once | INTRAMUSCULAR | Status: AC
  Administered 2023-09-21: 20 mg via INTRAVENOUS
  Filled 2023-09-21: qty 2

## 2023-09-21 MED ORDER — ACETAMINOPHEN 325 MG PO TABS
650.0000 mg | ORAL_TABLET | Freq: Four times a day (QID) | ORAL | Status: DC | PRN
  Administered 2023-09-28 – 2023-09-29 (×4): 650 mg via ORAL
  Filled 2023-09-21 (×3): qty 2

## 2023-09-21 MED ORDER — SODIUM CHLORIDE 0.9% FLUSH
3.0000 mL | Freq: Two times a day (BID) | INTRAVENOUS | Status: DC
  Administered 2023-09-21 – 2023-09-29 (×20): 3 mL via INTRAVENOUS

## 2023-09-21 MED ORDER — FUROSEMIDE 10 MG/ML IJ SOLN
40.0000 mg | Freq: Once | INTRAMUSCULAR | Status: AC
  Administered 2023-09-21: 40 mg via INTRAVENOUS
  Filled 2023-09-21: qty 4

## 2023-09-21 MED ORDER — METOPROLOL TARTRATE 25 MG PO TABS
25.0000 mg | ORAL_TABLET | Freq: Two times a day (BID) | ORAL | Status: DC
Start: 2023-09-21 — End: 2023-09-29
  Administered 2023-09-21 – 2023-09-29 (×19): 25 mg via ORAL
  Filled 2023-09-21 (×16): qty 1

## 2023-09-21 NOTE — ED Notes (Signed)
 Charge aware patient com ing up

## 2023-09-21 NOTE — Progress Notes (Signed)
 S/w [facility] ED Charge RN, Megan to give report on pt. She is coming per MD Gudena after review of recent echo. Pt will need admission and cardio workup. She is symptomatic with BLE edema and SHOB. Instructions given to RN for pt to hold Verzenio .

## 2023-09-21 NOTE — Consult Note (Addendum)
 Cardiology Consultation   Patient ID: JAQUILLA WOODROOF MRN: [id number]; DOB: [birth year]  Admit date: 09/21/2023 Date of Consult: 09/21/2023  PCP:  Cleotilde Planas, MD   [facility] Providers Cardiologist:  Gordy Bergamo, MD     Patient Profile: Madison Richards is a 89 y.o. female with a hx of hypertension, history of CVA, carotid artery disease, PAD, breast cancer who is being seen 09/21/2023 for the evaluation of CHF, atrial fibrillation at the request of Dr. Seena.  History of Present Illness: [name] is an 89 year old female with above medical history who has been followed by Dr. Bergamo.  Has predominantly been followed by Dr. Ganji for peripheral artery disease.  Previously had lower extremity arterial ultrasounds in 2021 that showed mildly decreased perfusion of the right lower extremity, mildly decreased perfusion of the left lower extremity.  Carotid ultrasounds from 2022 showed <50% stenosis in the bilateral ICAs.  Has been on Plavix , Zetia , pravastatin .  Patient last seen by Dr. Bergamo in 03/2023.  At that time, patient had stable claudication symptoms.  No open wounds.  No chest pain, shortness of breath.  Patient was seen by oncology on 08/20/2023 for breast cancer treatment follow-up.  She had initially been diagnosed with breast cancer in 06/2022.  Has been on Verzinio. Reported lower extremiy swelling and fatigue. Underwent echocardiogram 09/15/23 that showed EF 70-75%, no regional wall motion abnormalities, mild LVH, normal RV systolic function, severely elevated PA systolic pressure, mild MR, severe TR.   Patient presented to the ED on 8/4 complaining of shortness of breath that had been ongoing for several days.  Initial vital signs in the ED with BP 113/63, oxygen 95% on room air, heart rate 137 bpm. EKG showed atrial fibrillation with heart rate 139 bpm, left anterior fascicular block.  Labs significant for BNP 351.5, K3.4, albumin 2.2, total protein 5.4.  Chest x-ray showed  interval small bilateral pleural effusions and bibasilar atelectasis, extensive aortic atheromatous calcifications.   Patient was given IV Lasix  20 mg.  Admitted to the internal medicine service for treatment of acute on chronic diastolic heart failure, new onset atrial fibrillation.  Cardiology consulted for further evaluation  On interview, patient reports that she has been having fatigue and progressive lower extremity swelling for the past couple of weeks.  In the past week or so, she has also developed shortness of breath, orthopnea, and a dry cough.  Has also gained some weight in the past couple of days.  She denies any chest pain.  Denies palpitations.  When she arrived in the ER she was in A-fib with RVR.  She was unaware of her heart rate being elevated.  She denies any personal cardiac history, specifically denies history of atrial fibrillation.  She does have a long smoking history.  Some concern for COPD in the past but is never formally been diagnosed.  Denies history of GI or intracranial bleeding.  Past Medical History:  Diagnosis Date   Abdominal aortic aneurysm (AAA) (HCC)    Acute ischemic stroke (HCC) 02/09/2018   Breast cancer (HCC) 06/2022   right   Generalized headaches    due to neck pain   GERD (gastroesophageal reflux disease)    Hyperlipidemia    Hypertension    Neck pain    Osteoporosis    Stroke [facility])     Past Surgical History:  Procedure Laterality Date   ABDOMINAL ANGIOGRAM Bilateral 03/02/2012   Procedure: ABDOMINAL ANGIOGRAM;  Surgeon: Erick JONELLE Bergamo, MD;  Location:  [facility] CATH LAB;  Service: Cardiovascular;  Laterality: Bilateral;   ABDOMINAL ANGIOGRAM  04/06/2012   Procedure: ABDOMINAL ANGIOGRAM;  Surgeon: Erick JONELLE Bergamo, MD;  Location: [facility] CATH LAB;  Service: Cardiovascular;;   abdominal tumor     removed in 1968/benign   APPENDECTOMY     AUGMENTATION MAMMAPLASTY  2000   REMOVED in 2000   BREAST BIOPSY Right 07/01/2022   US  RT BREAST BX W LOC DEV  1ST LESION IMG BX SPEC US  GUIDE 07/01/2022 [facility]   BREAST BIOPSY Left 07/01/2022   US  LT BREAST BX W LOC DEV 1ST LESION IMG BX SPEC US  GUIDE 07/01/2022 [facility]   ILIAC ARTERY STENT     right side 2003 and 2014      Scheduled Meds:  [START ON 09/22/2023] colestipol   1 g Oral Daily   [START ON 09/22/2023] enoxaparin  (LOVENOX ) injection  40 mg Subcutaneous Q24H   [START ON 09/22/2023] ezetimibe   10 mg Oral Daily   [START ON 09/22/2023] letrozole   2.5 mg Oral Daily   metoprolol  tartrate  25 mg Oral BID   potassium chloride   40 mEq Oral Once   pravastatin   80 mg Oral QHS   sodium chloride  flush  3 mL Intravenous Q12H   Continuous Infusions:  PRN Meds: acetaminophen  **OR** acetaminophen , polyethylene glycol  Allergies:    Allergies  Allergen Reactions   Bacitracin-Polymyxin B Other (See Comments)    AREA APPLIED TO SPREAD   Fosamax [Alendronate Sodium] Other (See Comments)    Leg cramps   Simvastatin Other (See Comments)    leg pain   Clindamycin/Lincomycin Rash    Rash in mouth   Neosporin [Neomycin-Polymyxin-Gramicidin] Rash   Penicillins Rash    Rash in mouth    Social History:   Social History   Socioeconomic History   Marital status: Widowed    Spouse name: Not on file   Number of children: 2   Years of education: Not on file   Highest education level: Not on file  Occupational History   Not on file  Tobacco Use   Smoking status: Former    Current packs/day: 0.00    Types: Cigarettes    Quit date: 02/07/2018    Years since quitting: 5.6   Smokeless tobacco: Never  Vaping Use   Vaping status: Never Used  Substance and Sexual Activity   Alcohol use: Yes    Alcohol/week: 1.0 standard drink of alcohol    Types: 1 Glasses of wine per week    Comment: daily   Drug use: No   Sexual activity: Not on file  Other Topics Concern   Not on file  Social History Narrative   Not on file   Social Drivers of Health   Financial Resource Strain: Not on  file  Food Insecurity: No Food Insecurity (09/21/2023)   Hunger Vital Sign    Worried About Running Out of Food in the Last Year: Never true    Ran Out of Food in the Last Year: Never true  Transportation Needs: No Transportation Needs (09/21/2023)   PRAPARE - Administrator, Civil Service (Medical): No    Lack of Transportation (Non-Medical): No  Physical Activity: Not on file  Stress: Not on file  Social Connections: Unknown (09/21/2023)   Social Connection and Isolation Panel    Frequency of Communication with Friends and Family: More than three times a week    Frequency of Social Gatherings with Friends and Family: More than three  times a week    Attends Religious Services: Not on file    Active Member of Clubs or Organizations: Yes    Attends Club or Organization Meetings: More than 4 times per year    Marital Status: Widowed  Intimate Partner Violence: Patient Declined (09/21/2023)   Humiliation, Afraid, Rape, and Kick questionnaire    Fear of Current or Ex-Partner: Patient declined    Emotionally Abused: Patient declined    Physically Abused: Patient declined    Sexually Abused: Patient declined    Family History:    Family History  Problem Relation Age of Onset   Heart disease Sister    Heart disease Brother    Heart disease Brother    Throat cancer Brother 75       SCC   Breast cancer Niece 44   Ovarian cancer Niece        dx 83s   Colon cancer Neg Hx      ROS:  Please see the history of present illness.   All other ROS reviewed and negative.     Physical Exam/Data: Vitals:   09/21/23 1339 09/21/23 1430 09/21/23 1445 09/21/23 1545  BP:  (!) 162/79 (!) 178/71 95/77  Pulse:  81 93 78  Resp:  20 20 (!) 22  Temp: 97.8 F (36.6 C)   97.6 F (36.4 C)  TempSrc: Oral   Oral  SpO2:  100% 96% 92%  Weight:      Height:        Intake/Output Summary (Last 24 hours) at 09/21/2023 1750 Last data filed at 09/21/2023 1654 Gross per 24 hour  Intake 295 ml   Output 200 ml  Net 95 ml      09/21/2023   10:01 AM 08/20/2023    1:45 PM 05/18/2023   11:17 AM  Last 3 Weights  Weight (lbs) 100 lb 96 lb 1.6 oz 93 lb 14.4 oz  Weight (kg) 45.36 kg 43.591 kg 42.593 kg     Body mass index is 17.71 kg/m.  General: Thin elderly female, sitting comfortably in the bed in no acute distress HEENT: normal Neck: + JVD Cardiac:  normal S1, S2; RRR; no murmur  Lungs: crackles in bilateral lung bases, otherwise clear. Normal WOB on [place]  Abd: soft, nontender  Ext: 2+ edema in BLE, extending to the thighs  Musculoskeletal:  No deformities  Skin: warm and dry  Neuro:  CNs 2-12 intact, no focal abnormalities noted Psych:  Normal affect   EKG:  The EKG was personally reviewed and demonstrates:  atrial fibrillation with heart rate 139 bpm, left anterior fascicular block Telemetry:  Telemetry was personally reviewed and demonstrates:  On arrival to the ED, patient was in afib with RVR, HR in the 130s. After about 5 minutes, converted to NSR. Maintaining NSR with HR in the 70s-80s   Relevant CV Studies: Cardiac Studies & Procedures   ______________________________________________________________________________________________     ECHOCARDIOGRAM  ECHOCARDIOGRAM COMPLETE 09/15/2023  Narrative ECHOCARDIOGRAM REPORT    Patient Name:   KALY MCQUARY Date of Exam: 09/15/2023 Medical Rec #:  [id number]      Height:       63.0 in Accession #:    [id number]     Weight:       96.1 lb Date of Birth:  December 22, 1934      BSA:          1.416 m Patient Age:    88 years       BP:  136/78 mmHg Patient Gender: F              HR:           66 bpm. Exam Location:  Outpatient  Procedure: 2D Echo, 3D Echo, Cardiac Doppler, Color Doppler and Strain Analysis (Both Spectral and Color Flow Doppler were utilized during procedure).  Indications:    Z51.11 Encounter for antineoplastic chemotheraphy  History:        Patient has prior history of Echocardiogram examinations,  most recent 02/09/2018. Stroke; Risk Factors:Hypertension. Breast cancer.  Sonographer:    Ellouise Mose RDCS Referring Phys: [phone] MACKEY CHAD  IMPRESSIONS   1. Left ventricular ejection fraction, by estimation, is 70 to 75%. Left ventricular ejection fraction by 2D MOD biplane is 73.9 %. Left ventricular ejection fraction by PLAX is 64 %. The left ventricle has hyperdynamic function. The left ventricle has no regional wall motion abnormalities. There is mild concentric left ventricular hypertrophy. Left ventricular diastolic parameters are consistent with Grade I diastolic dysfunction (impaired relaxation). The average left ventricular global longitudinal strain is -24.0 %. The global longitudinal strain is normal. 2. Right ventricular systolic function is normal. The right ventricular size is moderately enlarged. There is severely elevated pulmonary artery systolic pressure. The estimated right ventricular systolic pressure is 62.6 mmHg. 3. Left atrial size was mildly dilated. 4. Right atrial size was moderately dilated. 5. Large pleural effusion in the left lateral region. 6. The mitral valve is degenerative. Mild mitral valve regurgitation. No evidence of mitral stenosis. Moderate mitral annular calcification. 7. Multiple TR jets. Tricuspid valve regurgitation is severe. 8. The aortic valve is tricuspid. There is moderate calcification of the aortic valve. Aortic valve regurgitation is not visualized. Aortic valve sclerosis/calcification is present, without any evidence of aortic stenosis. 9. The inferior vena cava is dilated in size with <50% respiratory variability, suggesting right atrial pressure of 15 mmHg.  FINDINGS Left Ventricle: Left ventricular ejection fraction, by estimation, is 70 to 75%. Left ventricular ejection fraction by PLAX is 64 %. Left ventricular ejection fraction by 2D MOD biplane is 73.9 %. The left ventricle has hyperdynamic function. The left ventricle has no  regional wall motion abnormalities. The average left ventricular global longitudinal strain is -24.0 %. Strain was performed and the global longitudinal strain is normal. The left ventricular internal cavity size was small. There is mild concentric left ventricular hypertrophy. Left ventricular diastolic parameters are consistent with Grade I diastolic dysfunction (impaired relaxation).  Right Ventricle: The right ventricular size is moderately enlarged. No increase in right ventricular wall thickness. Right ventricular systolic function is normal. There is severely elevated pulmonary artery systolic pressure. The tricuspid regurgitant velocity is 3.45 m/s, and with an assumed right atrial pressure of 15 mmHg, the estimated right ventricular systolic pressure is 62.6 mmHg.  Left Atrium: Left atrial size was mildly dilated.  Right Atrium: Right atrial size was moderately dilated.  Pericardium: There is no evidence of pericardial effusion.  Mitral Valve: The mitral valve is degenerative in appearance. Moderate mitral annular calcification. Mild mitral valve regurgitation. No evidence of mitral valve stenosis. MV peak gradient, 7.4 mmHg. The mean mitral valve gradient is 3.0 mmHg.  Tricuspid Valve: Multiple TR jets. The tricuspid valve is grossly normal. Tricuspid valve regurgitation is severe. No evidence of tricuspid stenosis.  Aortic Valve: The aortic valve is tricuspid. There is moderate calcification of the aortic valve. Aortic valve regurgitation is not visualized. Aortic valve sclerosis/calcification is present, without any evidence of aortic stenosis.  Pulmonic Valve: The pulmonic valve was normal in structure. Pulmonic valve regurgitation is mild to moderate. No evidence of pulmonic stenosis.  Aorta: The aortic root and ascending aorta are structurally normal, with no evidence of dilitation.  Venous: The inferior vena cava is dilated in size with less than 50% respiratory variability,  suggesting right atrial pressure of 15 mmHg.  IAS/Shunts: No atrial level shunt detected by color flow Doppler.  Additional Comments: 3D was performed not requiring image post processing on an independent workstation and was normal. There is a large pleural effusion in the left lateral region.   LEFT VENTRICLE PLAX 2D                        Biplane EF (MOD) LV EF:         Left            LV Biplane EF:   Left ventricular                      ventricular ejection                         ejection fraction by                      fraction by PLAX is 64                       2D MOD %.                               biplane is LVIDd:         3.50 cm                          73.9 %. LVIDs:         2.30 cm LV PW:         1.10 cm         Diastology LV IVS:        1.00 cm         LV e' medial:    6.53 cm/s LVOT diam:     2.10 cm         LV E/e' medial:  15.5 LV SV:         68              LV e' lateral:   6.53 cm/s LV SV Index:   48              LV E/e' lateral: 15.5 LVOT Area:     3.46 cm 2D Longitudinal Strain LV Volumes (MOD)               2D Strain GLS   -24.0 % LV vol d, MOD    36.4 ml       Avg: A2C: LV vol d, MOD    40.1 ml A4C: LV vol s, MOD    10.3 ml A2C: LV vol s, MOD    8.9 ml A4C: LV SV MOD A2C:   26.1 ml LV SV MOD A4C:   40.1 ml LV SV MOD BP:    28.3 ml  RIGHT VENTRICLE             IVC RV S prime:     12.00 cm/s  IVC diam: 2.00 cm TAPSE (M-mode): 1.8 cm  LEFT ATRIUM             Index        RIGHT ATRIUM           Index LA diam:        2.60 cm 1.84 cm/m   RA Area:     16.70 cm LA Vol (A2C):   28.4 ml 20.05 ml/m  RA Volume:   52.90 ml  37.36 ml/m LA Vol (A4C):   36.1 ml 25.49 ml/m LA Biplane Vol: 32.1 ml 22.67 ml/m AORTIC VALVE             PULMONIC VALVE LVOT Vmax:   81.50 cm/s  PR End Diast Vel: 2.55 msec LVOT Vmean:  57.400 cm/s LVOT VTI:    0.197 m  AORTA Ao Root diam: 3.10 cm Ao Asc diam:  2.65 cm  MITRAL VALVE                TRICUSPID VALVE MV  Area (PHT): 3.12 cm     TR Peak grad:   47.6 mmHg MV Area VTI:   1.89 cm     TR Vmax:        345.00 cm/s MV Peak grad:  7.4 mmHg MV Mean grad:  3.0 mmHg     SHUNTS MV Vmax:       1.36 m/s     Systemic VTI:  0.20 m MV Vmean:      84.3 cm/s    Systemic Diam: 2.10 cm MV Decel Time: 243 msec MV E velocity: 101.00 cm/s MV A velocity: 115.00 cm/s MV E/A ratio:  0.88  Toribio Fuel MD Electronically signed by Toribio Fuel MD Signature Date/Time: 09/15/2023/10:28:53 PM    Final          ______________________________________________________________________________________________        Laboratory Data: High Sensitivity Troponin:  No results for input(s): TROPONINIHS in the last 720 hours.   Chemistry Recent Labs  Lab 09/21/23 1023  NA 142  K 3.4*  CL 106  CO2 27  GLUCOSE 119*  BUN 31*  CREATININE 0.88  CALCIUM 9.6  MG 1.7  GFRNONAA >60  ANIONGAP 9    Recent Labs  Lab 09/21/23 1023  PROT 5.4*  ALBUMIN 2.2*  AST 42*  ALT 22  ALKPHOS 161*  BILITOT 0.9   Lipids No results for input(s): CHOL, TRIG, HDL, LABVLDL, LDLCALC, CHOLHDL in the last 168 hours.  Hematology Recent Labs  Lab 09/21/23 1023  WBC 7.4  RBC 2.83*  HGB 11.1*  HCT 33.4*  MCV 118.0*  MCH 39.2*  MCHC 33.2  RDW 17.4*  PLT 152   Thyroid   Recent Labs  Lab 09/21/23 1023  TSH 3.798  FREET4 1.14*    BNP Recent Labs  Lab 09/21/23 1023  BNP 351.5*    DDimer No results for input(s): DDIMER in the last 168 hours.  Radiology/Studies:  DG Chest Portable 1 View Result Date: 09/21/2023 CLINICAL DATA:  Shortness of breath for several days. Productive cough. EXAM: PORTABLE CHEST 1 VIEW COMPARISON:  02/26/2022 FINDINGS: Grossly stable normal sized heart. Interval increased density at both lung bases with small bilateral pleural effusions. Extensive aortic calcifications. Diffuse osteopenia. Mild-to-moderate lower thoracic spine degenerative changes. IMPRESSION: 1.  Interval small bilateral pleural effusions and bibasilar atelectasis or pneumonia. 2. Extensive aortic atheromatous calcifications. Electronically Signed   By: Elspeth Bathe M.D.   On: 09/21/2023 12:06     Assessment and  Plan:  Acute on chronic diastolic heart failure Severe tricuspid regurgitation - Echocardiogram 7/29 showed EF 70-75%, no wall motion abnormalities, mild LVH, normal RV systolic function, severely elevated PA systolic pressure, mild MR, severe TR  - Presented with SOB that has been ongoing for several days. Also has lower extremity swelling  - BNP elevated to 351  - CXR with small bilateral pleural effusions. Has lower extremity swelling, JVD on exam  - Given IV lasix  20 mg x1 in the ED. Give IV 40 mg tonight  - Follow urine output on lasix . May need albumin as well  - monitor strict I/O's, daily weights, Daily BMPs   New onset atrial fibrillation Paroxysmal atrial fibrillation - Patient was in afib with RVR upon arrival to the ED. Converted to Nsr  - Maintaining NSR per telemetry  - Continue metoprolol  tartrate 25 mg BID  - K 3.4- ordered supplementation  - CHADS-VASc 8 CHF, HTN, history of CVA, vascular disease)  - Start eliquis  2.5 mg BID (dose reduced for age, weight)   PAD Carotid artery disease - Stop plavix  with eliquis  start  - Continue zetia  10 mg daily, pravastatin  80 mg daily   Otherwise per primary -Breast cancer - History of CVA   Risk Assessment/Risk Scores:  New [place]  Heart Association (NYHA) Functional Class NYHA Class II  CHA2DS2-VASc Score = 8  This indicates a 10.8% annual risk of stroke. The patient's score is based upon: CHF History: 1 HTN History: 1 Diabetes History: 0 Stroke History: 2 Vascular Disease History: 1 Age Score: 2 Gender Score: 1     For questions or updates, please contact [facility] Please consult [email or web address] for contact info under    Signed, Rollo FABIENE Louder, PA-C  09/21/2023 5:50 PM   I  have seen and examined the patient along with Rollo FABIENE Louder, PA-C.  I have reviewed the chart, notes and new data.  I agree with PA/NP's note.  Key new complaints: symptoms of biventricular failure (R>L). Unaware of palpitations. Key examination changes: JVP 10 cm with prominent v waves, 3+ bilateral soft pitting edema Key new findings / data: normal LVEF, pseudonormal inflow (increased LA pressure), severe TR on echo likely secondary to RV dilation/ PAH. Estimated sPAP was 35 in 2019, now 64 mm Hg. Malnutrition with BMI<18 and albumin 2.2.  Presented in AFib w RVR, now in NSR.  Biventricular HF with preserved LVEF and moderate PAH.  Hard to say whether AFib w RVR triggered HF exacerbation or vice versa. Also unsure about relative contribution of diastolic HF versus chronic lung disease to [facility]. Some concern re: Verzenio  and increased arrhythmia risk.  PLAN: Increase diuretics. Eliquis  anticoagulation. Recheck echo for TR severity and PAP estimation once felt to be well diuresed. Discuss sodium restriction, daily weight monitoring, early detection of HF exacerbation, using personal electronic device for arrhythmia monitoring, etc. Work on improving nutrition.  Jerel Balding, MD, FACC [facility] [phone] 09/21/2023, 5:54 PM

## 2023-09-21 NOTE — Plan of Care (Signed)
   Problem: Education: Goal: Knowledge of General Education information will improve Description: Including pain rating scale, medication(s)/side effects and non-pharmacologic comfort measures Outcome: Progressing   Problem: Clinical Measurements: Goal: Respiratory complications will improve Outcome: Progressing

## 2023-09-21 NOTE — ED Triage Notes (Signed)
 Pt c/o sob x several days, sent by MD for further evaluation; endorses productive cough; denies fevers, denies pain

## 2023-09-21 NOTE — H&P (Signed)
 History and Physical   Madison Richards FMW:[id number] DOB: 07/10/1934 DOA: 09/21/2023  PCP: Cleotilde Planas, MD   Patient coming from: Home  Chief Complaint: Edema, abnormal echocardiogram  HPI: Madison Richards is a 89 y.o. female with medical history significant of hypertension, CVA, carotid artery disease, PAD, breast cancer presenting with edema and abnormal echocardiogram.  Patient has had ongoing intermittent edema which has been worsening recently for the past couple months.  Initially it was intermittent as above but now has become persistent and worsening.  Has began to interfere with ambulation.  Saw her oncologist recently and mention this and he ordered an echocardiogram.  Echocardiogram came back showing grade 1 diastolic dysfunction as well as severe tricuspid regurgitation and large left pleural effusion.  Patient was recommended to go to the ED and alcohol just admission to hold chemotherapy medication if admitted.  Patient reports shortness of breath.  Denies fevers, chills, chest pain, abdominal pain, constipation, diarrhea, nausea, vomiting.  ED Course: Vital signs in the ED notable for heart rate in the 60s-130s, new A-fib.  Blood pressure in the 110s-160s systolic.  Respiratory rate in the 20s, requiring 2 L to maintain saturations.  Lab workup included CMP with potassium 3.4, BUN 31, glucose 119, protein 5.4, albumin 2.2, AST stable at 42, alk phos 161.  CBC with hemoglobin stable 11.1.  BNP pending.  TSH normal with T4 borderline elevated at 1.14.  Blood smear ordered.  Chest x-ray showed small bilateral effusions with atelectasis versus pneumonia.  Also noted was aortic atherosclerosis.  Patient received Lasix  20 mg IV in the ED.  EP consulting cardiology, awaiting response.  Review of Systems: As per HPI otherwise all other systems reviewed and are negative.  Past Medical History:  Diagnosis Date   Abdominal aortic aneurysm (AAA) (HCC)    Acute ischemic stroke (HCC)  02/09/2018   Breast cancer (HCC) 06/2022   right   Generalized headaches    due to neck pain   GERD (gastroesophageal reflux disease)    Hyperlipidemia    Hypertension    Neck pain    Osteoporosis    Stroke [facility])     Past Surgical History:  Procedure Laterality Date   ABDOMINAL ANGIOGRAM Bilateral 03/02/2012   Procedure: ABDOMINAL ANGIOGRAM;  Surgeon: Erick JONELLE Bergamo, MD;  Location: [facility] CATH LAB;  Service: Cardiovascular;  Laterality: Bilateral;   ABDOMINAL ANGIOGRAM  04/06/2012   Procedure: ABDOMINAL ANGIOGRAM;  Surgeon: Erick JONELLE Bergamo, MD;  Location: [facility] CATH LAB;  Service: Cardiovascular;;   abdominal tumor     removed in 1968/benign   APPENDECTOMY     AUGMENTATION MAMMAPLASTY  2000   REMOVED in 2000   BREAST BIOPSY Right 07/01/2022   US  RT BREAST BX W LOC DEV 1ST LESION IMG BX SPEC US  GUIDE 07/01/2022 [facility]MOGRAP[facility]   BREAST BIOPSY Left 07/01/2022   US  LT BREAST BX W LOC DEV 1ST LESION IMG BX SPEC US  GUIDE 07/01/2022 [facility] MAMMOGR[facility]   ILIAC ARTERY STENT     right side 2003 and 2014    Social History  reports that she quit smoking about 5 years ago. Her smoking use included cigarettes. She has never used smokeless tobacco. She reports current alcohol use of about 1.0 standard drink of alcohol per week. She reports that she does not use drugs.  Allergies  Allergen Reactions   Bacitracin-Polymyxin B     Other reaction(s): AREA APPLIED TO SPREAD   Fosamax [Alendronate Sodium]     Leg cramps  Simvastatin     Other Reaction(s): leg pain   Clindamycin/Lincomycin Rash    Rash in mouth   Neosporin [Neomycin-Polymyxin-Gramicidin] Rash   Penicillins Rash    Rash in mouth    Family History  Problem Relation Age of Onset   Heart disease Sister    Heart disease Brother    Heart disease Brother    Throat cancer Brother 66       SCC   Breast cancer Niece 24   Ovarian cancer Niece        dx 13s   Colon cancer Neg Hx   Reviewed on admission  Prior to Admission  medications   Medication Sig Start Date End Date Taking? Authorizing Provider  abemaciclib  (VERZENIO ) 50 MG tablet Take 1 tablet (50 mg total) by mouth 2 (two) times daily. 05/05/23   Gudena, Vinay, MD  acetaminophen  (TYLENOL ) 500 MG tablet Take 500 mg by mouth every 6 (six) hours as needed. Taking as needed once daily    [provider]  amLODipine -valsartan  (EXFORGE ) 5-160 MG tablet TAKE 1 TABLET BY MOUTH DAILY 07/24/23   Ladona Heinz, MD  Calcium Carbonate-Vitamin D  (CALCIUM 600 + D PO) Take 1 tablet by mouth in the morning and at bedtime.    [provider]  Cholecalciferol  (VITAMIN D3) 5000 UNITS TABS Take 1 tablet by mouth every Monday, Wednesday, and Friday.    [provider]  clopidogrel  (PLAVIX ) 75 MG tablet TAKE 1 TABLET BY MOUTH EVERY DAY 05/27/23   Ladona Heinz, MD  Coenzyme Q10 (COQ10 PO) Take 200 mg by mouth.    [provider]  colestipol  (COLESTID ) 1 g tablet Take 1 g by mouth daily. Take 2 pills daily    [provider]  denosumab  (XGEVA ) 120 MG/1.7ML SOLN injection Inject 120 mg into the skin every 3 (three) months.    [provider]  estradiol  (ESTRACE  VAGINAL) 0.1 MG/GM vaginal cream Place 1 Applicatorful vaginally 2 (two) times a week. 02/23/23   Gudena, Vinay, MD  ezetimibe  (ZETIA ) 10 MG tablet Take 10 mg by mouth daily.    [provider]  fexofenadine (ALLEGRA) 180 MG tablet Take 180 mg by mouth daily.    [provider]  hydrochlorothiazide  (MICROZIDE ) 12.5 MG capsule TAKE 1 CAPSULE(12.5 MG) BY MOUTH DAILY 03/25/23   Ladona Heinz, MD  letrozole  (FEMARA ) 2.5 MG tablet TAKE 1 TABLET BY MOUTH EVERY DAY 07/27/23   Gudena, Vinay, MD  metoprolol  tartrate (LOPRESSOR ) 25 MG tablet TAKE 1 TABLET BY MOUTH TWICE A DAY 12/29/22   Ladona Heinz, MD  Multiple Vitamin (MULTIVITAMIN WITH MINERALS) TABS Take 1 tablet by mouth daily.    [provider]  pravastatin  (PRAVACHOL ) 80 MG tablet Take 1 tablet (80 mg total) by mouth at  bedtime. 03/24/23   Ladona Heinz, MD    Physical Exam: Vitals:   09/21/23 0954 09/21/23 1001 09/21/23 1100  BP: 113/63  (!) 162/50  Pulse: (!) 137  64  Resp: (!) 31  18  Temp: 97.9 F (36.6 C)    TempSrc: Oral    SpO2: 95%  100%  Weight:  45.4 kg   Height:  5' 3 (1.6 m)     Physical Exam Constitutional:      General: She is not in acute distress.    Appearance: Normal appearance.  HENT:     Head: Normocephalic and atraumatic.     Mouth/Throat:     Mouth: Mucous membranes are moist.     Pharynx:  Oropharynx is clear.  Eyes:     Extraocular Movements: Extraocular movements intact.     Pupils: Pupils are equal, round, and reactive to light.  Cardiovascular:     Rate and Rhythm: Normal rate and regular rhythm.     Pulses: Normal pulses.     Heart sounds: Normal heart sounds.  Pulmonary:     Effort: Pulmonary effort is normal. No respiratory distress.     Breath sounds: Normal breath sounds.  Abdominal:     General: Bowel sounds are normal. There is no distension.     Palpations: Abdomen is soft.     Tenderness: There is no abdominal tenderness.  Musculoskeletal:        General: No swelling or deformity.     Right lower leg: Edema present.     Left lower leg: Edema present.  Skin:    General: Skin is warm and dry.  Neurological:     General: No focal deficit present.     Mental Status: Mental status is at baseline.    Labs on Admission: I have personally reviewed following labs and imaging studies  CBC: Recent Labs  Lab 09/21/23 1023  WBC 7.4  NEUTROABS 6.2  HGB 11.1*  HCT 33.4*  MCV 118.0*  PLT 152    Basic Metabolic Panel: Recent Labs  Lab 09/21/23 1023  NA 142  K 3.4*  CL 106  CO2 27  GLUCOSE 119*  BUN 31*  CREATININE 0.88  CALCIUM 9.6  MG 1.7    GFR: Estimated Creatinine Clearance: 32.9 mL/min (by C-G formula based on SCr of 0.88 mg/dL).  Liver Function Tests: Recent Labs  Lab 09/21/23 1023  AST 42*  ALT 22  ALKPHOS 161*  BILITOT  0.9  PROT 5.4*  ALBUMIN 2.2*    Urine analysis:    Component Value Date/Time   COLORURINE YELLOW 02/08/2018 1733   APPEARANCEUR CLEAR 02/08/2018 1733   LABSPEC 1.010 02/08/2018 1733   PHURINE 5.5 02/08/2018 1733   GLUCOSEU NEGATIVE 02/08/2018 1733   HGBUR MODERATE (A) 02/08/2018 1733   BILIRUBINUR NEGATIVE 02/08/2018 1733   KETONESUR 15 (A) 02/08/2018 1733   PROTEINUR NEGATIVE 02/08/2018 1733   UROBILINOGEN 0.2 05/27/2014 0430   NITRITE NEGATIVE 02/08/2018 1733   LEUKOCYTESUR NEGATIVE 02/08/2018 1733    Radiological Exams on Admission: DG Chest Portable 1 View Result Date: 09/21/2023 CLINICAL DATA:  Shortness of breath for several days. Productive cough. EXAM: PORTABLE CHEST 1 VIEW COMPARISON:  02/26/2022 FINDINGS: Grossly stable normal sized heart. Interval increased density at both lung bases with small bilateral pleural effusions. Extensive aortic calcifications. Diffuse osteopenia. Mild-to-moderate lower thoracic spine degenerative changes. IMPRESSION: 1. Interval small bilateral pleural effusions and bibasilar atelectasis or pneumonia. 2. Extensive aortic atheromatous calcifications. Electronically Signed   By: Elspeth Bathe M.D.   On: 09/21/2023 12:06   EKG: Independently reviewed.  Atrial fibrillation with RVR at 139 bpm.  Nonspecific T wave changes.  Minimal baseline wander.  PVCs noted.  Assessment/Plan Active Problems:   HTN (hypertension)   PAD (peripheral artery disease) (HCC)   Left carotid artery stenosis   H/O: CVA (cerebrovascular accident)   Malignant neoplasm of upper-outer quadrant of right breast in female, estrogen receptor positive (HCC)   Acute respiratory failure with hypoxia Acute on chronic diastolic CHF Severe tricuspid regurgitation Pleural effusion > Patient with prior history of diastolic dysfunction on echo in 2019, but not requiring any diuresis for this.  > Repeat echo in the setting of worsening edema performed in  the last week which showed EF  70-75%, G1 DD, normal RV function, severe tricuspid regurgitation.  With pleural effusion. > Found to be requiring 2 L supplemental oxygen in the ED.  BNP is pending.  Chest x-ray with small pleural effusions.  Patient received 20 mg IV Lasix . > Cardiology consult requested for input on patient's severe tricuspid regurgitation which may be driving her symptoms. - Monitor in progressive unit overnight given concomitant intermittent RVR as below - Appreciate cardiology recommendations and assistance - Monitor response to initial Lasix , further doses likely to follow - Strict I's and O's, daily weights - No need for repeat echo - Trend renal function and electrolytes  New onset A-fib > Intermittent RVR in the ED.  Currently controlled rates. - Monitoring of progress overnight in case infusion is needed - Appreciate cardiology recommendations and assistance - Continue home metoprolol  - Will determine anticoagulation after cardiology of valve incase of need for intervention.  Breast cancer > Follows with oncology, noted to have bony metastases as well.  On chemotherapy and hormonal therapy. - Hold chemotherapy for oncology recommendations - Continue home letrozole   Hypertension - Hold home amlodipine -valsartan , hydrochlorothiazide  with low-normal BP after diuresis. - Continue home metoprolol   History of CVA PAD Carotid artery disease - Continue home metoprolol  - Continue home colestipol , Zetia , pravastatin  - Plavix , hold pending cardiology eval  DVT prophylaxis: SCDs for now, pending cardiology recs  Code Status:   Full  Family Communication:  Updated at bedside  Disposition Plan:   Patient is from:  Home  Anticipated DC to:  Home  Anticipated DC date:  1 to 3 days  Anticipated DC barriers: None  Consults called:  Cardiology Admission status:  Observation, progressive  Severity of Illness: The appropriate patient status for this patient is OBSERVATION. Observation status is  judged to be reasonable and necessary in order to provide the required intensity of service to ensure the patient's safety. The patient's presenting symptoms, physical exam findings, and initial radiographic and laboratory data in the context of their medical condition is felt to place them at decreased risk for further clinical deterioration. Furthermore, it is anticipated that the patient will be medically stable for discharge from the hospital within 2 midnights of admission.    Marsa KATHEE Scurry MD [facility]  How to contact the [facility] Attending or Consulting provider 7A - 7P or covering provider during after hours 7P -7A, for this patient?   Check the care team in [facility] and look for a) attending/consulting [facility] provider listed and b) the [facility] team listed Log into [email or web address] and use [facility] universal password to access. If you do not have the password, please contact the hospital operator. Locate the [facility] provider you are looking for under [facility] and page to a number that you can be directly reached. If you still have difficulty reaching the provider, please page the [facility] (Director on Call) for the Hospitalists listed on amion for assistance.  09/21/2023, 1:36 PM

## 2023-09-21 NOTE — Telephone Encounter (Signed)
 CC: Severe shortness of breath, profound lower extremity edema Review echocardiogram: I discussed with the patient that echocardiogram revealed that she has a large left pleural effusion as well as severe tricuspid regurgitation.  Recommendation: Emergency room to have the pleural effusion drained and see cardiology/cardiothoracic surgery regarding her valvular heart disease.  If she does get admitted, she should hold Verzinio until after discharge. Patient sees Dr. Ganji for cardiology as an outpatient.

## 2023-09-21 NOTE — ED Provider Notes (Signed)
 [place] EMERGENCY DEPARTMENT AT [facility] Provider Note   CSN: [id number] Arrival date & time: 09/21/23  9061     Patient presents with: Shortness of Breath   Madison Richards is a 89 y.o. 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  Patient states that she has had approximately 1 week of shortness of breath that is worse upon exertion and with associated orthopnea.  Patient states that he received patient states that she received TTE in the outpatient setting and was told to present to the emergency department for the findings.  Alongside shortness of breath, patient does endorse bilateral lower extremity edema however denies associated chest pain, nausea, vomiting, abdominal pain or syncope.    Shortness of Breath      Prior to Admission medications   Medication Sig Start Date End Date Taking? Authorizing Provider  abemaciclib  (VERZENIO ) 50 MG tablet Take 1 tablet (50 mg total) by mouth 2 (two) times daily. 05/05/23   Gudena, Vinay, MD  acetaminophen  (TYLENOL ) 500 MG tablet Take 500 mg by mouth every 6 (six) hours as needed. Taking as needed once daily    [provider]  amLODipine -valsartan  (EXFORGE ) 5-160 MG tablet TAKE 1 TABLET BY MOUTH DAILY 07/24/23   Ladona Heinz, MD  Calcium Carbonate-Vitamin D  (CALCIUM 600 + D PO) Take 1 tablet by mouth in the morning and at bedtime.    [provider]  Cholecalciferol  (VITAMIN D3) 5000 UNITS TABS Take 1 tablet by mouth every Monday, Wednesday, and Friday.    [provider]  clopidogrel  (PLAVIX ) 75 MG tablet TAKE 1 TABLET BY MOUTH EVERY DAY 05/27/23   Ladona Heinz, MD  Coenzyme Q10 (COQ10 PO) Take 200 mg by mouth.    [provider]  colestipol  (COLESTID ) 1 g tablet Take 1 g by mouth daily. Take 2 pills daily    [provider]  denosumab  (XGEVA )  120 MG/1.7ML SOLN injection Inject 120 mg into the skin every 3 (three) months.    [provider]  estradiol  (ESTRACE  VAGINAL) 0.1 MG/GM vaginal cream Place 1 Applicatorful vaginally 2 (two) times a week. 02/23/23   Gudena, Vinay, MD  ezetimibe  (ZETIA ) 10 MG tablet Take 10 mg by mouth daily.    [provider]  fexofenadine (ALLEGRA) 180 MG tablet Take 180 mg by mouth daily.    [provider]  hydrochlorothiazide  (MICROZIDE ) 12.5 MG capsule TAKE 1 CAPSULE(12.5 MG) BY MOUTH DAILY 03/25/23   Ladona Heinz, MD  letrozole  (FEMARA ) 2.5 MG tablet TAKE 1 TABLET BY MOUTH EVERY DAY 07/27/23   Gudena, Vinay, MD  metoprolol  tartrate (LOPRESSOR ) 25 MG tablet TAKE 1 TABLET BY MOUTH TWICE A DAY 12/29/22   Ladona Heinz, MD  Multiple Vitamin (MULTIVITAMIN WITH MINERALS) TABS Take 1 tablet by mouth daily.    [provider]  pravastatin  (PRAVACHOL ) 80 MG tablet Take 1 tablet (80 mg total) by mouth at bedtime. 03/24/23   Ladona Heinz, MD    Allergies: Bacitracin-polymyxin b, Fosamax [alendronate sodium], Simvastatin, Clindamycin/lincomycin, Neosporin [neomycin-polymyxin-gramicidin], and Penicillins    Review of Systems  Respiratory:  Positive for shortness of breath.     Updated Vital Signs BP 95/77 (BP Location: Left Arm)   Pulse 78   Temp 97.6 F (36.4 C) (Oral)   Resp (!) 22   Ht 5' 3 (1.6 m)   Wt 45.4 kg  SpO2 92%   BMI 17.71 kg/m   Physical Exam Vitals reviewed.  Constitutional:      General: She is not in acute distress.    Interventions: Nasal cannula in place.     Comments: 2L nasal cannula  HENT:     Head: Normocephalic and atraumatic.  Eyes:     Pupils: Pupils are equal, round, and reactive to light.  Neck:     Vascular: No JVD.     Trachea: Trachea normal.  Cardiovascular:     Rate and Rhythm: Normal rate. Rhythm irregularly irregular.     Heart sounds: Normal heart sounds. No murmur heard. Pulmonary:     Effort: Tachypnea present.     Breath sounds:  Examination of the right-lower field reveals rales. Examination of the left-lower field reveals rales. Rales present.  Abdominal:     General: There is no distension.     Palpations: Abdomen is soft.     Tenderness: There is no abdominal tenderness.  Musculoskeletal:     Right lower leg: 3+ Edema present.     Left lower leg: 3+ Edema present.     Comments: Spontaneous movement of bilateral upper and lower extremities  Neurological:     Mental Status: She is alert.  Psychiatric:        Behavior: Behavior is cooperative.     (all labs ordered are listed, but only abnormal results are displayed) Labs Reviewed  CBC WITH DIFFERENTIAL/PLATELET - Abnormal; Notable for the following components:      Result Value   RBC 2.83 (*)    Hemoglobin 11.1 (*)    HCT 33.4 (*)    MCV 118.0 (*)    MCH 39.2 (*)    RDW 17.4 (*)    Lymphs Abs 0.4 (*)    All other components within normal limits  COMPREHENSIVE METABOLIC PANEL WITH GFR - Abnormal; Notable for the following components:   Potassium 3.4 (*)    Glucose, Bld 119 (*)    BUN 31 (*)    Total Protein 5.4 (*)    Albumin 2.2 (*)    AST 42 (*)    Alkaline Phosphatase 161 (*)    All other components within normal limits  BRAIN NATRIURETIC PEPTIDE - Abnormal; Notable for the following components:   B Natriuretic Peptide 351.5 (*)    All other components within normal limits  T4, FREE - Abnormal; Notable for the following components:   Free T4 1.14 (*)    All other components within normal limits  MAGNESIUM   TSH  PATHOLOGIST SMEAR REVIEW    EKG: EKG Interpretation Date/Time:  Monday September 21 2023 09:55:55 EDT Ventricular Rate:  139 PR Interval:    QRS Duration:  72 QT Interval:  318 QTC Calculation: 484 R Axis:   -83  Text Interpretation: Atrial fibrillation with rapid V-rate Left anterior fascicular block Anteroseptal infarct, old ST depression, probably rate related Confirmed by Madison Rea [phone]) on 09/21/2023 10:20:46  AM  Radiology: DG Chest Portable 1 View Result Date: 09/21/2023 CLINICAL DATA:  Shortness of breath for several days. Productive cough. EXAM: PORTABLE CHEST 1 VIEW COMPARISON:  02/26/2022 FINDINGS: Grossly stable normal sized heart. Interval increased density at both lung bases with small bilateral pleural effusions. Extensive aortic calcifications. Diffuse osteopenia. Mild-to-moderate lower thoracic spine degenerative changes. IMPRESSION: 1. Interval small bilateral pleural effusions and bibasilar atelectasis or pneumonia. 2. Extensive aortic atheromatous calcifications. Electronically Signed   By: Elspeth Bathe M.D.   On: 09/21/2023 12:06  Procedures   Medications Ordered in the ED  letrozole  (FEMARA ) tablet 2.5 mg (has no administration in time range)  colestipol  (COLESTID ) tablet 1 g (has no administration in time range)  ezetimibe  (ZETIA ) tablet 10 mg (has no administration in time range)  hydrochlorothiazide  (HYDRODIURIL ) tablet 12.5 mg (has no administration in time range)  metoprolol  tartrate (LOPRESSOR ) tablet 25 mg (has no administration in time range)  pravastatin  (PRAVACHOL ) tablet 80 mg (has no administration in time range)  enoxaparin  (LOVENOX ) injection 40 mg (has no administration in time range)  sodium chloride  flush (NS) 0.9 % injection 3 mL (3 mLs Intravenous Given 09/21/23 1447)  acetaminophen  (TYLENOL ) tablet 650 mg (has no administration in time range)    Or  acetaminophen  (TYLENOL ) suppository 650 mg (has no administration in time range)  polyethylene glycol (MIRALAX  / GLYCOLAX ) packet 17 g (has no administration in time range)  furosemide  (LASIX ) injection 20 mg (20 mg Intravenous Given 09/21/23 1113)    Clinical Course as of 09/21/23 1550  Mon Sep 21, 2023  1018 TTE 7/29: HFpEF EF 70 to 75%, severe tricuspid regurgitation, moderate mitral regurgitation.  Large left pleural effusion [AG]  1018 EKG reviewed by me: Atrial fibrillation rate controlled with normal axis  and no evidence of acute ischemic change [AG]  1314 Hospitalist consulted [AG]  1343 Cards consulted [AG]    Clinical Course User Index [AG] Nada Chroman, DO                                 Medical Decision Making Amount and/or Complexity of Data Reviewed Labs: ordered. Radiology: ordered.  Risk Prescription drug management. Decision regarding hospitalization.   On initial evaluation patient is hemodynamically stable with rate controlled atrial fibrillation with a rate of 90-100, normotensive and afebrile.  Patient does have evidence of tachypnea with associated bibasilar Rales and hypoxia on room air therefore we will start patient on 2 L nasal cannula.  Based upon patient's history and physical examination findings differential diagnosis include pneumonia, pneumothorax, heart failure exacerbation, acute pulmonary edema, pulmonary edema secondary to tricuspid regurgitation, atrial fibrillation with rapid ventricular rate, pulmonary embolism.  Will obtain laboratory studies as well as chest x-ray imaging.  Based upon patient's laboratory studies, physical examination findings and echocardiogram performed in the outpatient setting concern for pulmonary edema secondary to acute heart failure exacerbation versus severe tricuspid regurgitation.  Will give patient Lasix  for clinical hypervolemia.  At this time do not believe patient's presentation is secondary to pulmonary embolism.  Patient has a history of pulmonary embolism and is compliant with blood thinning medications.  Patient has an etiology for acute hypoxic respiratory failure in the setting of pulmonary edema therefore do not believe further evaluation for pulmonary embolism is necessary at this time.  Based upon acute hypoxic respiratory failure with new oxygen requirement, will admit patient to hospitalist with consult to cardiology.  At the time of admission patient agreed with and understood plan of care and had no further  questions     Final diagnoses:  Acute hypoxic respiratory failure (HCC)  Tricuspid valve insufficiency, unspecified etiology    ED Discharge Orders     None      Chroman Nada DO  Emergency Medicine PGY2    Madison Eagleton, DO 09/21/23 1550    Madison Lavonia SAILOR, MD 09/22/23 [id number]

## 2023-09-21 NOTE — ED Notes (Addendum)
 Placed patient on 2L [place] per Dr Nada request.

## 2023-09-21 NOTE — ED Notes (Signed)
 X-ray at bedside

## 2023-09-22 ENCOUNTER — Other Ambulatory Visit (HOSPITAL_COMMUNITY): Payer: Self-pay

## 2023-09-22 ENCOUNTER — Telehealth (HOSPITAL_COMMUNITY): Payer: Self-pay | Admitting: Pharmacy Technician

## 2023-09-22 DIAGNOSIS — E43 Unspecified severe protein-calorie malnutrition: Secondary | ICD-10-CM | POA: Diagnosis present

## 2023-09-22 DIAGNOSIS — Z79811 Long term (current) use of aromatase inhibitors: Secondary | ICD-10-CM | POA: Diagnosis not present

## 2023-09-22 DIAGNOSIS — J449 Chronic obstructive pulmonary disease, unspecified: Secondary | ICD-10-CM | POA: Diagnosis not present

## 2023-09-22 DIAGNOSIS — J9601 Acute respiratory failure with hypoxia: Secondary | ICD-10-CM | POA: Diagnosis present

## 2023-09-22 DIAGNOSIS — I5033 Acute on chronic diastolic (congestive) heart failure: Secondary | ICD-10-CM | POA: Diagnosis present

## 2023-09-22 DIAGNOSIS — I6522 Occlusion and stenosis of left carotid artery: Secondary | ICD-10-CM | POA: Diagnosis present

## 2023-09-22 DIAGNOSIS — J90 Pleural effusion, not elsewhere classified: Secondary | ICD-10-CM | POA: Diagnosis not present

## 2023-09-22 DIAGNOSIS — E785 Hyperlipidemia, unspecified: Secondary | ICD-10-CM | POA: Diagnosis present

## 2023-09-22 DIAGNOSIS — Z170 Estrogen receptor positive status [ER+]: Secondary | ICD-10-CM | POA: Diagnosis not present

## 2023-09-22 DIAGNOSIS — I739 Peripheral vascular disease, unspecified: Secondary | ICD-10-CM | POA: Diagnosis present

## 2023-09-22 DIAGNOSIS — Z87891 Personal history of nicotine dependence: Secondary | ICD-10-CM | POA: Diagnosis not present

## 2023-09-22 DIAGNOSIS — I10 Essential (primary) hypertension: Secondary | ICD-10-CM | POA: Diagnosis not present

## 2023-09-22 DIAGNOSIS — J918 Pleural effusion in other conditions classified elsewhere: Secondary | ICD-10-CM | POA: Diagnosis present

## 2023-09-22 DIAGNOSIS — Z8673 Personal history of transient ischemic attack (TIA), and cerebral infarction without residual deficits: Secondary | ICD-10-CM | POA: Diagnosis not present

## 2023-09-22 DIAGNOSIS — M810 Age-related osteoporosis without current pathological fracture: Secondary | ICD-10-CM | POA: Diagnosis present

## 2023-09-22 DIAGNOSIS — C801 Malignant (primary) neoplasm, unspecified: Secondary | ICD-10-CM | POA: Diagnosis not present

## 2023-09-22 DIAGNOSIS — Z8249 Family history of ischemic heart disease and other diseases of the circulatory system: Secondary | ICD-10-CM | POA: Diagnosis not present

## 2023-09-22 DIAGNOSIS — I2721 Secondary pulmonary arterial hypertension: Secondary | ICD-10-CM

## 2023-09-22 DIAGNOSIS — I071 Rheumatic tricuspid insufficiency: Secondary | ICD-10-CM | POA: Diagnosis present

## 2023-09-22 DIAGNOSIS — Z681 Body mass index (BMI) 19 or less, adult: Secondary | ICD-10-CM | POA: Diagnosis not present

## 2023-09-22 DIAGNOSIS — I480 Paroxysmal atrial fibrillation: Secondary | ICD-10-CM | POA: Diagnosis present

## 2023-09-22 DIAGNOSIS — I110 Hypertensive heart disease with heart failure: Secondary | ICD-10-CM | POA: Diagnosis present

## 2023-09-22 DIAGNOSIS — I2722 Pulmonary hypertension due to left heart disease: Secondary | ICD-10-CM | POA: Diagnosis present

## 2023-09-22 DIAGNOSIS — I272 Pulmonary hypertension, unspecified: Secondary | ICD-10-CM | POA: Diagnosis present

## 2023-09-22 DIAGNOSIS — C50411 Malignant neoplasm of upper-outer quadrant of right female breast: Secondary | ICD-10-CM | POA: Diagnosis present

## 2023-09-22 DIAGNOSIS — J439 Emphysema, unspecified: Secondary | ICD-10-CM | POA: Diagnosis present

## 2023-09-22 DIAGNOSIS — I444 Left anterior fascicular block: Secondary | ICD-10-CM | POA: Diagnosis present

## 2023-09-22 DIAGNOSIS — J9811 Atelectasis: Secondary | ICD-10-CM | POA: Diagnosis present

## 2023-09-22 DIAGNOSIS — Z79899 Other long term (current) drug therapy: Secondary | ICD-10-CM | POA: Diagnosis not present

## 2023-09-22 DIAGNOSIS — C7951 Secondary malignant neoplasm of bone: Secondary | ICD-10-CM | POA: Diagnosis present

## 2023-09-22 LAB — CBC
HCT: 29.5 % — ABNORMAL LOW (ref 36.0–46.0)
Hemoglobin: 9.9 g/dL — ABNORMAL LOW (ref 12.0–15.0)
MCH: 38.7 pg — ABNORMAL HIGH (ref 26.0–34.0)
MCHC: 33.6 g/dL (ref 30.0–36.0)
MCV: 115.2 fL — ABNORMAL HIGH (ref 80.0–100.0)
Platelets: 135 K/uL — ABNORMAL LOW (ref 150–400)
RBC: 2.56 MIL/uL — ABNORMAL LOW (ref 3.87–5.11)
RDW: 17.9 % — ABNORMAL HIGH (ref 11.5–15.5)
WBC: 4.9 K/uL (ref 4.0–10.5)
nRBC: 0 % (ref 0.0–0.2)

## 2023-09-22 LAB — COMPREHENSIVE METABOLIC PANEL WITH GFR
ALT: 19 U/L (ref 0–44)
AST: 38 U/L (ref 15–41)
Albumin: 2 g/dL — ABNORMAL LOW (ref 3.5–5.0)
Alkaline Phosphatase: 144 U/L — ABNORMAL HIGH (ref 38–126)
Anion gap: 5 (ref 5–15)
BUN: 29 mg/dL — ABNORMAL HIGH (ref 8–23)
CO2: 29 mmol/L (ref 22–32)
Calcium: 8.9 mg/dL (ref 8.9–10.3)
Chloride: 106 mmol/L (ref 98–111)
Creatinine, Ser: 0.95 mg/dL (ref 0.44–1.00)
GFR, Estimated: 58 mL/min — ABNORMAL LOW (ref >60)
Glucose, Bld: 90 mg/dL (ref 70–99)
Potassium: 3.7 mmol/L (ref 3.5–5.1)
Sodium: 140 mmol/L (ref 135–145)
Total Bilirubin: 0.7 mg/dL (ref 0.0–1.2)
Total Protein: 4.8 g/dL — ABNORMAL LOW (ref 6.5–8.1)

## 2023-09-22 MED ORDER — SPIRONOLACTONE 25 MG PO TABS
25.0000 mg | ORAL_TABLET | Freq: Every day | ORAL | Status: DC
  Administered 2023-09-22 – 2023-09-29 (×10): 25 mg via ORAL
  Filled 2023-09-22 (×8): qty 1

## 2023-09-22 MED ORDER — GUAIFENESIN-DM 100-10 MG/5ML PO SYRP
5.0000 mL | ORAL_SOLUTION | ORAL | Status: DC | PRN
  Administered 2023-09-22 – 2023-09-24 (×5): 5 mL via ORAL
  Filled 2023-09-22 (×5): qty 5

## 2023-09-22 MED ORDER — SALINE SPRAY 0.65 % NA SOLN
1.0000 | NASAL | Status: DC | PRN
  Administered 2023-09-22: 1 via NASAL
  Filled 2023-09-22: qty 44

## 2023-09-22 MED ORDER — FUROSEMIDE 10 MG/ML IJ SOLN
80.0000 mg | Freq: Two times a day (BID) | INTRAMUSCULAR | Status: DC
  Administered 2023-09-22 – 2023-09-24 (×6): 80 mg via INTRAVENOUS
  Filled 2023-09-22 (×6): qty 8

## 2023-09-22 NOTE — Plan of Care (Signed)
   Problem: Education: Goal: Knowledge of General Education information will improve Description: Including pain rating scale, medication(s)/side effects and non-pharmacologic comfort measures Outcome: Progressing   Problem: Health Behavior/Discharge Planning: Goal: Ability to manage health-related needs will improve Outcome: Progressing   Problem: Clinical Measurements: Goal: Will remain free from infection Outcome: Progressing

## 2023-09-22 NOTE — Telephone Encounter (Signed)
 Patient Product/process development scientist completed.    The patient is insured through [employer]. Patient has Medicare and is not eligible for a copay card, but may be able to apply for patient assistance or Medicare RX Payment Plan (Patient Must reach out to their plan, if eligible for payment plan), if available.    Ran test claim for Eliquis 5 mg and the current 30 day co-pay is $0.00.  Ran test claim for Xarelto 20 mg and the current 30 day co-pay is $0.00.  This test claim was processed through [facility]- copay amounts may vary at other pharmacies due to pharmacy/plan contracts, or as the patient moves through the different stages of their insurance plan.     Roland Earl, CPHT Pharmacy Technician III Certified Patient Advocate [facility] Patient Advocate Team Direct Number: [phone]  Fax: [phone]

## 2023-09-22 NOTE — Care Management Obs Status (Signed)
 MEDICARE OBSERVATION STATUS NOTIFICATION   Patient Details  Name: Madison Richards MRN: [id number] Date of Birth: [birth year]   Medicare Observation Status Notification Given:  Yes    [name] 09/22/2023, 2:33 PM

## 2023-09-22 NOTE — TOC CM/SW Note (Addendum)
 Transition of Care [facility]) - Inpatient Brief Assessment   Patient Details  Name: Madison Richards MRN: [id number] Date of Birth: [birth year]  Transition of Care [facility]) CM/SW Contact:    Waddell Barnie Rama, RN Phone Number: 09/22/2023, 2:41 PM   Clinical Narrative: From home alone, has PCP and insurance on file, states has no HH services in place at this time , has cane, 3 wheel walker, 4 wheel walker, bsc at home.  States family member  (daughter) will transport them home at [employer] and family is support system, states gets medications from [facility] on [address].  Pta self ambulatory with walker/cane.   Await pt eval in couple days.  If she needs home oxygen, she does not have a preference of the agency.   Transition of Care Asessment: Insurance and Status: Insurance coverage has been reviewed Patient has primary care physician: Yes Home environment has been reviewed: home alone Prior level of function:: ambulatory with walker/cane Prior/Current Home Services: Current home services (cane, 3 wheel walker, 4 wheel walker, bsc) Social Drivers of Health Review: SDOH reviewed no interventions necessary Readmission risk has been reviewed: Yes Transition of care needs: transition of care needs identified, TOC will continue to follow

## 2023-09-22 NOTE — Progress Notes (Signed)
 Progress Note   Patient: Madison Richards FMW:[id number] DOB: [birth year] DOA: 09/21/2023     0 DOS: the patient was seen and examined on 09/22/2023   Brief hospital course: 89 y.o. female with medical history significant of hypertension, CVA, carotid artery disease, PAD, breast cancer presenting with edema and abnormal echocardiogram.  Patient with progressive edema for the past few months.  Saw her oncologist recently and mentioned this and he ordered an echocardiogram.  Echocardiogram came back showing grade 1 diastolic dysfunction as well as severe tricuspid regurgitation pulmonary hypertension.  Patient is being treated for CHF exacerbation  Assessment and Plan:   Acute on chronic diastolic CHF Severe tricuspid regurgitation Patient with prior history of diastolic dysfunction on echo in 2019, but not requiring any diuresis for this.   Repeat echo in the setting of worsening edema performed in the last week which showed EF 70-75%, G1 DD, normal RV function, severe tricuspid regurgitation, severely elevated pulmonary arterial systolic pressure.   Large pleural effusion reported on echo but only small effusions noted on chest x-ray. BNP: 351  Cardiology consult requested for input on patient's severe tricuspid regurgitation which may be driving her symptoms. - Monitor in progressive unit overnight given concomitant intermittent RVR as below - Appreciate cardiology recommendations and assistance - Monitor response to initial Lasix , further doses likely to follow - Strict I's and O's, daily weights - No need for repeat echo - Trend renal function and electrolytes   New onset A-fib Intermittent RVR in the ED.  Currently controlled rates. - Appreciate cardiology recommendations and assistance - Continue home metoprolol  -Started on apixaban  by cardiology.  Acute respiratory failure with hypoxia Due to CHF exacerbation. No baseline oxygen use. Patient requiring as much as 6 L/min  supplemental oxygen. - Continue supplemental oxygen, and wean as tolerated.   Breast cancer Follows with oncology, noted to have bony metastases as well.  On chemotherapy and hormonal therapy. - Hold chemotherapy per oncology recommendations - Continue home letrozole    Hypertension - Hold home amlodipine -valsartan , hydrochlorothiazide  with low-normal BP after diuresis. - Continue home metoprolol    History of CVA PAD Carotid artery disease - Continue home metoprolol  - Continue home colestipol , Zetia , pravastatin  - Cardiology recommend discontinuation of Plavix  in the setting of new Eliquis .      Subjective: Patient complains of shortness of breath, orthopnea. She denies melena.   Physical Exam: Vitals:   09/22/23 0721 09/22/23 0816 09/22/23 0845 09/22/23 1051  BP:  (!) 169/68  (!) 136/57  Pulse: 92 86 82 69  Resp:  16  18  Temp:    98.1 F (36.7 C)  TempSrc:    Oral  SpO2:  92% 98% 98%  Weight:      Height:       Physical Exam   General: Alert, oriented X3  Eyes: Pupils equal, reactive  Oral cavity: moist mucous membranes  Head: Atraumatic, normocephalic  Neck: supple  Chest: Bilateral basal crackles CVS: S1,S2 RRR. No murmurs  Abd: No distention, soft, non-tender. No masses palpable  Extr: Pedal edema ++ MSK: No joint deformities or swelling  Neurological: Grossly intact.    Data Reviewed:     Latest Ref Rng & Units 09/22/2023    3:34 AM 09/21/2023   10:23 AM 08/20/2023    1:27 PM  CBC  WBC 4.0 - 10.5 K/uL 4.9  7.4  5.8   Hemoglobin 12.0 - 15.0 g/dL 9.9  88.8  88.5   Hematocrit 36.0 - 46.0 % 29.5  33.4  33.3   Platelets 150 - 400 K/uL 135  152  132       Latest Ref Rng & Units 09/22/2023    3:34 AM 09/21/2023   10:23 AM 08/20/2023    1:27 PM  BMP  Glucose 70 - 99 mg/dL 90  880  97   BUN 8 - 23 mg/dL 29  31  23    Creatinine 0.44 - 1.00 mg/dL 9.04  9.11  9.10   Sodium 135 - 145 mmol/L 140  142  143   Potassium 3.5 - 5.1 mmol/L 3.7  3.4  4.0   Chloride 98  - 111 mmol/L 106  106  105   CO2 22 - 32 mmol/L 29  27  33   Calcium 8.9 - 10.3 mg/dL 8.9  9.6  89.7      Family Communication: Husband at bedside  Disposition: Status is: Inpatient Remains inpatient appropriate because: Requiring supplemental oxygen (new requirement), receiving IV diuretics.  Planned Discharge Destination: To be determined    Time spent: 35 minutes  Author: MDALA-GAUSI, Shawnise Peterkin AGATHA, MD 09/22/2023 1:16 PM  For on call review [email or web address].

## 2023-09-22 NOTE — Progress Notes (Signed)
  Progress Note  Patient Name: Madison Richards Date of Encounter: 09/22/2023 [facility] Cardiologist: Gordy Bergamo, MD   Interval Summary   Remains dyspneic, coughing. Only slight improvement. No recurrence of atrial fibrillation. BP high.  Vital Signs Vitals:   09/22/23 0719 09/22/23 0721 09/22/23 0816 09/22/23 0845  BP:   (!) 169/68   Pulse: 84 92 86 82  Resp:   16   Temp:      TempSrc:      SpO2: 93%  92% 98%  Weight:      Height:        Intake/Output Summary (Last 24 hours) at 09/22/2023 0916 Last data filed at 09/22/2023 0845 Gross per 24 hour  Intake 575 ml  Output 1450 ml  Net -875 ml      09/22/2023    5:38 AM 09/21/2023   10:01 AM 08/20/2023    1:45 PM  Last 3 Weights  Weight (lbs) 101 lb 6.6 oz 100 lb 96 lb 1.6 oz  Weight (kg) 46 kg 45.36 kg 43.591 kg      Telemetry/ECG  SR w PACs - Personally Reviewed  Physical Exam  GEN: No acute distress.   Neck: 8-9 cm JVD, big V waves Cardiac: RRR, 2/6 holosystolic murmur LLSB, no diastolic murmurs, rubs, or gallops.  Respiratory: Clear to auscultation bilaterally. GI: Soft, nontender, non-distended  MS: 3+ symmetrical pitting edema to knees  Assessment & Plan  CHF: biventricular, R>L, still clearly hypervolemic. Mediocre overall diuresis and stable renal parameters. Increase loop diuretic dose and add spironolactone . PAH: suspect combined WHO group 2 (diast HF) and 3 (COPD) mechanism. AFib: started on Eliquis . In NSR now for >24h.   For questions or updates, please contact [facility] Please consult [email or web address] for contact info under       Signed, Jerel Balding, MD

## 2023-09-23 ENCOUNTER — Other Ambulatory Visit: Payer: Self-pay

## 2023-09-23 DIAGNOSIS — I5033 Acute on chronic diastolic (congestive) heart failure: Secondary | ICD-10-CM | POA: Diagnosis not present

## 2023-09-23 DIAGNOSIS — Z8673 Personal history of transient ischemic attack (TIA), and cerebral infarction without residual deficits: Secondary | ICD-10-CM | POA: Diagnosis not present

## 2023-09-23 DIAGNOSIS — E43 Unspecified severe protein-calorie malnutrition: Secondary | ICD-10-CM | POA: Diagnosis not present

## 2023-09-23 DIAGNOSIS — I10 Essential (primary) hypertension: Secondary | ICD-10-CM | POA: Diagnosis not present

## 2023-09-23 LAB — CBC
HCT: 30.2 % — ABNORMAL LOW (ref 36.0–46.0)
Hemoglobin: 10.2 g/dL — ABNORMAL LOW (ref 12.0–15.0)
MCH: 39.1 pg — ABNORMAL HIGH (ref 26.0–34.0)
MCHC: 33.8 g/dL (ref 30.0–36.0)
MCV: 115.7 fL — ABNORMAL HIGH (ref 80.0–100.0)
Platelets: 157 K/uL (ref 150–400)
RBC: 2.61 MIL/uL — ABNORMAL LOW (ref 3.87–5.11)
RDW: 17.1 % — ABNORMAL HIGH (ref 11.5–15.5)
WBC: 4.6 K/uL (ref 4.0–10.5)
nRBC: 0 % (ref 0.0–0.2)

## 2023-09-23 LAB — BASIC METABOLIC PANEL WITH GFR
Anion gap: 10 (ref 5–15)
BUN: 25 mg/dL — ABNORMAL HIGH (ref 8–23)
CO2: 31 mmol/L (ref 22–32)
Calcium: 8.4 mg/dL — ABNORMAL LOW (ref 8.9–10.3)
Chloride: 99 mmol/L (ref 98–111)
Creatinine, Ser: 1.02 mg/dL — ABNORMAL HIGH (ref 0.44–1.00)
GFR, Estimated: 54 mL/min — ABNORMAL LOW (ref >60)
Glucose, Bld: 102 mg/dL — ABNORMAL HIGH (ref 70–99)
Potassium: 3.4 mmol/L — ABNORMAL LOW (ref 3.5–5.1)
Sodium: 140 mmol/L (ref 135–145)

## 2023-09-23 LAB — MAGNESIUM: Magnesium: 1.4 mg/dL — ABNORMAL LOW (ref 1.7–2.4)

## 2023-09-23 LAB — PATHOLOGIST SMEAR REVIEW

## 2023-09-23 MED ORDER — POTASSIUM CHLORIDE CRYS ER 20 MEQ PO TBCR
40.0000 meq | EXTENDED_RELEASE_TABLET | ORAL | Status: AC
  Administered 2023-09-23 (×2): 40 meq via ORAL
  Filled 2023-09-23 (×2): qty 2

## 2023-09-23 MED ORDER — MAGNESIUM SULFATE 4 GM/100ML IV SOLN
4.0000 g | Freq: Once | INTRAVENOUS | Status: AC
  Administered 2023-09-23: 4 g via INTRAVENOUS
  Filled 2023-09-23: qty 100

## 2023-09-23 MED ORDER — ADULT MULTIVITAMIN W/MINERALS CH
1.0000 | ORAL_TABLET | Freq: Every day | ORAL | Status: DC
  Administered 2023-09-23 – 2023-09-29 (×9): 1 via ORAL
  Filled 2023-09-23 (×7): qty 1

## 2023-09-23 MED ORDER — FOLIC ACID 1 MG PO TABS
1.0000 mg | ORAL_TABLET | Freq: Every day | ORAL | Status: DC
  Administered 2023-09-23 – 2023-09-29 (×9): 1 mg via ORAL
  Filled 2023-09-23 (×7): qty 1

## 2023-09-23 MED ORDER — ENSURE PLUS HIGH PROTEIN PO LIQD
237.0000 mL | Freq: Three times a day (TID) | ORAL | Status: DC
  Administered 2023-09-23 – 2023-09-28 (×15): 237 mL via ORAL

## 2023-09-23 MED ORDER — VITAMIN C 500 MG PO TABS
500.0000 mg | ORAL_TABLET | Freq: Every day | ORAL | Status: DC
  Administered 2023-09-23 – 2023-09-29 (×9): 500 mg via ORAL
  Filled 2023-09-23 (×7): qty 1

## 2023-09-23 NOTE — Assessment & Plan Note (Signed)
Continue nutritional supplements. °

## 2023-09-23 NOTE — Hospital Course (Addendum)
 Mrs. Madison Richards was admitted to the hospital with the working diagnosis of heart failure exacerbation.   89 y.o. female with medical history significant of hypertension, history of CVA, carotid artery disease, PAD, breast cancer presenting with edema. Reported worsening dyspnea and lower extremity edema, to the point where she has having difficulty ambulating. She had outpatient echocardiogram and her oncologist recommended her to come to the ED.  On her initial physical examination her blood pressure was 162/50, HR 137 to 64, RR 31 and 02 saturation 95%.  Lungs with with no wheezing or rhonchi, heart with S1 and S2 present and tachycardic, irregularly irregular, positive systolic murmur at the right lower sternal border, abdomen with no distention, positive lower extremity edema.   Na 142, K 3.4 Cl 106 bicarbonate 27, glucose 119 bun 31 cr 0,88  Mg 1,7  AST 42 and ALT 22  BNP 351 Wbc 7,4 hgb 11.1 plt 152   Chest radiograph with right rotation and hyperinflation, no cardiomegaly, bilateral hilar vascular congestion with cephalization of the vasculature, small right effusion.   EKG 139 bpm, normal axis, qtc 484, atrial fibrillation rhythm with PVC, ST segment depression V5 and V6, with no significant T wave changes.   Patient was placed on furosemide  IV with improvement in volume status.  Dyspnea component likely due to COPD, placed on inhaled corticosteroids and bronchodilator therapy.  CT chest negative for pulmonary embolism, but positive for right pleural effusion.  Consulted IR for thoracentesis

## 2023-09-23 NOTE — Assessment & Plan Note (Signed)
Continue blood pressure monitoring.  

## 2023-09-23 NOTE — Plan of Care (Signed)
   Problem: Health Behavior/Discharge Planning: Goal: Ability to manage health-related needs will improve Outcome: Progressing

## 2023-09-23 NOTE — Progress Notes (Signed)
  Progress Note  Patient Name: Madison Richards Date of Encounter: 09/23/2023 [facility] Cardiologist: Gordy Bergamo, MD   Interval Summary   Doing much better, but still sleeps with the head of the bed elevated.  Peripheral edema is pretty much gone.  Still on oxygen at 7 L by nasal cannula and still coughing.  Has nasal congestion and has had mild epistaxis. Diuresis much improvement in the last 24 hours, net -2.7 L since admission.  Weight however has only decreased by about 1.2 kilograms. Blood pressure has been in normal range or slightly high and renal parameters remain stable, despite aggressive diuresis. Has maintained sinus rhythm in last 24 hours.  Vital Signs Vitals:   09/22/23 2300 09/23/23 0300 09/23/23 0500 09/23/23 0724  BP: 139/70 (!) 151/67  (!) 164/59  Pulse:  75  96  Resp: 20 20  18   Temp: 97.8 F (36.6 C) 98 F (36.7 C)  97.8 F (36.6 C)  TempSrc: Oral Oral  Oral  SpO2: 91% 99%  90%  Weight:   44.8 kg   Height:        Intake/Output Summary (Last 24 hours) at 09/23/2023 0852 Last data filed at 09/23/2023 0300 Gross per 24 hour  Intake 717 ml  Output 2600 ml  Net -1883 ml      09/23/2023    5:00 AM 09/22/2023    5:38 AM 09/21/2023   10:01 AM  Last 3 Weights  Weight (lbs) 98 lb 12.3 oz 101 lb 6.6 oz 100 lb  Weight (kg) 44.8 kg 46 kg 45.36 kg      Telemetry/ECG  Sinus rhythm occasional PACs- Personally Reviewed  Physical Exam  GEN: No acute distress.   Neck: 4 cm JVD, V waves much less prominent Cardiac: RRR, faint systolic murmur at the left lower sternal border, no diastolic murmurs, rubs, or gallops.  Respiratory: Clear to auscultation bilaterally. GI: Soft, nontender, non-distended  MS: No edema, skin on the legs is now wrinkled  Assessment & Plan  Acute on chronic HFpEF: Clear improvement in signs of hypervolemia, but still requiring significant oxygen supplementation (several liters nasal cannula).  Continue intravenous diuretics another 24  hours.  Physical exam suggest that the tricuspid regurgitation has also diminished. PAH: Moderately severe and likely a combination of WHO group 2 and group 3 mechanisms. AFib: On presentation, spontaneously resolved.  Now in sinus rhythm for the last 48 hours.  On Eliquis  anticoagulation with mild epistaxis.   For questions or updates, please contact [facility] Please consult [email or web address] for contact info under       Signed, Jerel Balding, MD

## 2023-09-23 NOTE — Assessment & Plan Note (Signed)
 Follow up as outpatient.

## 2023-09-23 NOTE — Progress Notes (Signed)
 Initial Nutrition Assessment  DOCUMENTATION CODES:   Severe malnutrition in context of chronic illness  INTERVENTION:  -Liberalize menu to 2g Na+ menu, regular texture, thin liquids -Add Ensure Plus High Protein TID -Add Magic Cup BID -Add MVI, Vit C, Folic Acid    NUTRITION DIAGNOSIS:   Severe Malnutrition related to catabolic illness, chronic illness, decreased appetite, cancer and cancer related treatments, constipation as evidenced by severe muscle depletion, severe fat depletion, edema.  GOAL:   Patient will meet greater than or equal to 90% of their needs  MONITOR:   PO intake, Weight trends, Supplement acceptance, Skin, Labs  REASON FOR ASSESSMENT:   Diagnosis    ASSESSMENT:   Hx hypertension, CVA, carotid artery disease, PAD, breast cancer presenting with edema and abnormal echocardiogram.  Spoke to pt at bedside. Pt denies n/v and chewing/swallowing difficulties at this time. She does endorse mostly constipation, though, occasional diarrhea she associates with chemotherapy medication. Pt's edema is improving, though still mild/mod. Pt with overall decline in weight x 12 months, though, recently shown to have gain. Suspect gain is r/t fluid accumulation and the edema is concealing additional losses. Pt does state good appetite, documented meal intake average 75%, states she is eating normally at home. NFPE complete (see below), pt meets criteria for severe protein malnutrition. Discussed increased kcal/pro need with cancer dx and tx, discussed muscle losses with age/decreased mobility. Discussed regarding increased kcal/pro intake, fortifying foods, not restricting choices. Pt verbalized understanding and is agreeable to adding Ensure Plus High Protein TID, Magic Cup BID. Pt does drink ONS at home, typically the [employer] protein shakes. Pt also states she has been really focusing on protein intake at home, encouraged her to also focus on increased kcal. Pt denies additional  questions/concerns at this time, will continue to monitor, RDN available prn.   Labs Potassium 3.4 BG 90-119 BUN 25 Creat 1.02 Calcium 8.4 Mg 1.4 Albumin 2.0 ALK 144 GFR 54 H/H 10.2/30.2 T4 1.14  Medication  apixaban   2.5 mg Oral BID   ascorbic acid   500 mg Oral Daily   colestipol   1 g Oral Daily   ezetimibe   10 mg Oral Daily   feeding supplement  237 mL Oral TID BM   folic acid   1 mg Oral Daily   furosemide   80 mg Intravenous BID   letrozole   2.5 mg Oral Daily   metoprolol  tartrate  25 mg Oral BID   multivitamin with minerals  1 tablet Oral Daily   potassium chloride   40 mEq Oral Q4H   pravastatin   80 mg Oral QHS   sodium chloride  flush  3 mL Intravenous Q12H   spironolactone   25 mg Oral Daily     NUTRITION - FOCUSED PHYSICAL EXAM:  Flowsheet Row Most Recent Value  Orbital Region Severe depletion  Upper Arm Region Moderate depletion  Thoracic and Lumbar Region Moderate depletion  Buccal Region Severe depletion  Temple Region Severe depletion  Clavicle Bone Region Severe depletion  Clavicle and Acromion Bone Region Severe depletion  Scapular Bone Region Moderate depletion  Dorsal Hand Severe depletion  Patellar Region Moderate depletion  Anterior Thigh Region Moderate depletion  Posterior Calf Region Moderate depletion  Edema (RD Assessment) Moderate  Hair Reviewed  [texture changes with chemo]  Eyes Reviewed  Mouth Reviewed  Skin Reviewed  Nails Reviewed  [dry, brittle, cracking]    Diet Order:   Diet Order             Diet Heart Room service appropriate? Yes;  Fluid consistency: Thin; Fluid restriction: 2000 mL Fluid  Diet effective now                 EDUCATION NEEDS:   Education needs have been addressed  Skin:  Skin Assessment: Reviewed RN Assessment  Last BM:  8/5  Height:   Ht Readings from Last 1 Encounters:  09/21/23 5' 3 (1.6 m)    Weight:   Wt Readings from Last 1 Encounters:  09/23/23 44.8 kg    BMI:  Body mass index  is 17.5 kg/m.  Estimated Nutritional Needs:   Kcal:  1350-1375 kcal  Protein:  55-70 g  Fluid:  1500 ml, 2000 ml rest  Madison Richards, RDN, LDN Registered Dietitian Nutritionist RD Inpatient Contact Info in [place]

## 2023-09-23 NOTE — Assessment & Plan Note (Addendum)
 Left carotid stenosis. Peripheral vascular disease.  Continue blood pressure control and statin

## 2023-09-23 NOTE — Assessment & Plan Note (Addendum)
 Echocardiogram with preserved LV systolic function with EF 70 to 75%, mild LVH, RV systolic function is preserved. RVSP 62,2 mmHg, mild LA dilatation, moderate RA dilatation, mild MR, with severe tricuspid valve regurgitation. No aortic stenosis.   Acute on chronic core pulmonale  Pulmonary hypertension, likely group 2 or 3    Patient was placed on IV furosemide  for diuresis, negative fluid balance was achieved, -6,595 ml with significant improvement in her symptoms.    Plan to continue medical therapy with metoprolol , spironolactone , SGLT 2 and furosemide    Acute hypoxemic respiratory failure, due to acute cardiogenic pulmonary edema, bilateral pleural effusions, more right than left.  Likely has also chronic hypoxemia due to COPD and pulmonary hypertension.  08/12 right ultrasound guided thoracentesis, 600 cc fluid removed, fluid analysis transudate based on protein ratio. But noted high LDH.   At the time of her discharge 02 saturation today is 95% on 4 L per [place] Follow up chest radiograph with improvement in right pleural effusion.

## 2023-09-23 NOTE — Progress Notes (Signed)
  Progress Note   Patient: Madison Richards FMW:[id number] DOB: 1934-04-20 DOA: 09/21/2023     1 DOS: the patient was seen and examined on 09/23/2023   Brief hospital course: 89 y.o. female with medical history significant of hypertension, CVA, carotid artery disease, PAD, breast cancer presenting with edema and abnormal echocardiogram.  Patient with progressive edema for the past few months.  Saw her oncologist recently and mentioned this and he ordered an echocardiogram.  Echocardiogram came back showing grade 1 diastolic dysfunction as well as severe tricuspid regurgitation pulmonary hypertension.  Patient is being treated for CHF exacerbation  Assessment and Plan: * Acute on chronic diastolic (congestive) heart failure (HCC) Echocardiogram with preserved LV systolic function with EF 70 to 75%, mild LVH, RV systolic function is preserved. RVSP 62,2 mmHg, mild LA dilatation, moderate RA dilatation, mild MR, with severe tricuspid valve regurgitation. No aortic stenosis.   Urine output is 2,950 ml Systolic blood pressure 140 mmHg.   Plan to continue diuresis with furosemide  80 mg IV bid.  Continue metoprolol  and spironolactone    HTN (hypertension) Continue blood pressure control with metoprolol  and spironolactone .   H/O: CVA (cerebrovascular accident) Left carotid stenosis. Peripheral vascular disease.  Continue blood pressure control and statin   Protein-calorie malnutrition, severe Continue nutritional supplements.   Malignant neoplasm of upper-outer quadrant of right breast in female, estrogen receptor positive (HCC) Follow up as outpatient.      Subjective: patient with improvement in dyspnea and edema but not yet back to baseline, continue to use supplemental 02 per [place]   Physical Exam: Vitals:   09/22/23 2300 09/23/23 0300 09/23/23 0500 09/23/23 0724  BP: 139/70 (!) 151/67  (!) 164/59  Pulse:  75  96  Resp: 20 20  18   Temp: 97.8 F (36.6 C) 98 F (36.7 C)  97.8 F (36.6 C)   TempSrc: Oral Oral  Oral  SpO2: 91% 99%  90%  Weight:   44.8 kg   Height:       Neurology awake and alert ENT with mild pallor with no icterus Cardiovascular with S1 and S2 present and regular with no gallops, rubs or murmurs Respiratory with rales bilaterally with no wheezing, and no rhonchi, has prolonged expiratory phase Abdomen with no distention, non tender Positive lower extremity edema ++   Data Reviewed:    Family Communication: her friend partner is at the bedside   Disposition: Status is: Inpatient Remains inpatient appropriate because: diuresis   Planned Discharge Destination: Home    Author: Elidia Toribio Furnace, MD 09/23/2023 7:40 AM  For on call review [email or web address].

## 2023-09-24 DIAGNOSIS — Z8673 Personal history of transient ischemic attack (TIA), and cerebral infarction without residual deficits: Secondary | ICD-10-CM | POA: Diagnosis not present

## 2023-09-24 DIAGNOSIS — E43 Unspecified severe protein-calorie malnutrition: Secondary | ICD-10-CM | POA: Diagnosis not present

## 2023-09-24 DIAGNOSIS — I10 Essential (primary) hypertension: Secondary | ICD-10-CM | POA: Diagnosis not present

## 2023-09-24 DIAGNOSIS — I5033 Acute on chronic diastolic (congestive) heart failure: Secondary | ICD-10-CM | POA: Diagnosis not present

## 2023-09-24 LAB — CBC
HCT: 28.6 % — ABNORMAL LOW (ref 36.0–46.0)
Hemoglobin: 9.6 g/dL — ABNORMAL LOW (ref 12.0–15.0)
MCH: 39 pg — ABNORMAL HIGH (ref 26.0–34.0)
MCHC: 33.6 g/dL (ref 30.0–36.0)
MCV: 116.3 fL — ABNORMAL HIGH (ref 80.0–100.0)
Platelets: 123 K/uL — ABNORMAL LOW (ref 150–400)
RBC: 2.46 MIL/uL — ABNORMAL LOW (ref 3.87–5.11)
RDW: 17.1 % — ABNORMAL HIGH (ref 11.5–15.5)
WBC: 4.8 K/uL (ref 4.0–10.5)
nRBC: 0 % (ref 0.0–0.2)

## 2023-09-24 LAB — BASIC METABOLIC PANEL WITH GFR
Anion gap: 7 (ref 5–15)
BUN: 29 mg/dL — ABNORMAL HIGH (ref 8–23)
CO2: 32 mmol/L (ref 22–32)
Calcium: 8.1 mg/dL — ABNORMAL LOW (ref 8.9–10.3)
Chloride: 97 mmol/L — ABNORMAL LOW (ref 98–111)
Creatinine, Ser: 0.95 mg/dL (ref 0.44–1.00)
GFR, Estimated: 58 mL/min — ABNORMAL LOW (ref >60)
Glucose, Bld: 114 mg/dL — ABNORMAL HIGH (ref 70–99)
Potassium: 4.3 mmol/L (ref 3.5–5.1)
Sodium: 136 mmol/L (ref 135–145)

## 2023-09-24 MED ORDER — UMECLIDINIUM BROMIDE 62.5 MCG/ACT IN AEPB
1.0000 | INHALATION_SPRAY | Freq: Every day | RESPIRATORY_TRACT | Status: DC
  Administered 2023-09-24 – 2023-09-26 (×3): 1 via RESPIRATORY_TRACT
  Filled 2023-09-24 (×2): qty 7

## 2023-09-24 MED ORDER — LEVALBUTEROL HCL 0.63 MG/3ML IN NEBU: 0.6300 mg | INHALATION_SOLUTION | Freq: Three times a day (TID) | RESPIRATORY_TRACT | Status: DC

## 2023-09-24 MED ORDER — LEVALBUTEROL HCL 0.63 MG/3ML IN NEBU
0.6300 mg | INHALATION_SOLUTION | Freq: Two times a day (BID) | RESPIRATORY_TRACT | Status: DC
  Administered 2023-09-25 – 2023-09-26 (×2): 0.63 mg via RESPIRATORY_TRACT
  Filled 2023-09-24 (×2): qty 3

## 2023-09-24 NOTE — Progress Notes (Signed)
  Progress Note  Patient Name: Madison Richards Date of Encounter: 09/24/2023 [facility] Cardiologist: Gordy Bergamo, MD   Interval Summary   Breathing continues to improve and she is no longer orthopneic, but still requiring several liters O2 supplement.  Wet sounding cough with thick sticky secretions.  Few wheezes. Edema is markedly improved. Net diuresis 4 L since admission.  Weight down 7 pounds since admission.  Vital Signs Vitals:   09/23/23 2104 09/23/23 2333 09/24/23 0424 09/24/23 0729  BP: 126/60 (!) 129/52 (!) 137/53 (!) 151/62  Pulse:  82 79 77  Resp:  17 18 18   Temp:  98.2 F (36.8 C) 98 F (36.7 C) 98.4 F (36.9 C)  TempSrc:  Oral Oral Oral  SpO2:  95% 93% 93%  Weight:   42.6 kg   Height:        Intake/Output Summary (Last 24 hours) at 09/24/2023 0925 Last data filed at 09/24/2023 0809 Gross per 24 hour  Intake 480 ml  Output 1800 ml  Net -1320 ml      09/24/2023    4:24 AM 09/23/2023    5:00 AM 09/22/2023    5:38 AM  Last 3 Weights  Weight (lbs) 93 lb 14.7 oz 98 lb 12.3 oz 101 lb 6.6 oz  Weight (kg) 42.6 kg 44.8 kg 46 kg      Telemetry/ECG  NSR - Personally Reviewed  Physical Exam  GEN: No acute distress.   Neck: No JVD.  Prominent V waves are no longer present. Cardiac: RRR, no murmurs, rubs, or gallops.  Cannot hear the holosystolic murmur of TR anymore. Respiratory: Clear to auscultation bilaterally. GI: Soft, nontender, non-distended  MS: 1+ soft pitting edema of the ankles only, symmetrically   Assessment & Plan   CHF: I think 1 more day of IV loop diuretic should suffice.   COPD: Continued hypoxia is likely due to concomitant chronic lung disease.  Mobilize.  Incentive spirometry.  Start bronchodilators and anticholinergics. PAH/TR: Combined WHO group 2 and group 3 mechanism, repeat echocardiogram to assess PA pressure and severity of tricuspid regurgitation after discharge, in roughly 8 weeks. AFib: Recurrence in last 72 hours.  On oral  anticoagulants.    For questions or updates, please contact [facility] Please consult [email or web address] for contact info under       Signed, Jerel Balding, MD

## 2023-09-24 NOTE — Progress Notes (Signed)
 Mobility Specialist Progress Note:    09/24/23 1430  Mobility  Activity Ambulated with assistance  Level of Assistance Contact guard assist, steadying assist  Assistive Device Other (Comment) (HHA)  Distance Ambulated (ft) 200 ft  Activity Response Tolerated well  Mobility Referral Yes  Mobility visit 1 Mobility  Mobility Specialist Start Time (ACUTE ONLY) 1430  Mobility Specialist Stop Time (ACUTE ONLY) 1450  Mobility Specialist Time Calculation (min) (ACUTE ONLY) 20 min   Pt pleasant and agreeable to session. Pt needing MinA to go sup>sit but able to sit>stand on her own. Pt comfortable with HHA no AD while ambulating. Pt little off balance drifting left each time. No c/o any symptoms. Left pt in recliner w/ all needs met.  Venetia Keel Mobility Specialist Please Neurosurgeon or Rehab Office at [phone]

## 2023-09-24 NOTE — Progress Notes (Signed)
  Progress Note   Patient: [place] Richards FMW:[id number] DOB: August 19, 1934 DOA: 09/21/2023     2 DOS: the patient was seen and examined on 09/24/2023   Brief hospital course: 89 y.o. female with medical history significant of hypertension, CVA, carotid artery disease, PAD, breast cancer presenting with edema and abnormal echocardiogram.  Patient with progressive edema for the past few months.  Saw her oncologist recently and mentioned this and he ordered an echocardiogram.  Echocardiogram came back showing grade 1 diastolic dysfunction as well as severe tricuspid regurgitation pulmonary hypertension.  Patient is being treated for CHF exacerbation  Assessment and Plan: * Acute on chronic diastolic (congestive) heart failure (HCC) Echocardiogram with preserved LV systolic function with EF 70 to 75%, mild LVH, RV systolic function is preserved. RVSP 62,2 mmHg, mild LA dilatation, moderate RA dilatation, mild MR, with severe tricuspid valve regurgitation. No aortic stenosis.   Urine output is 2,950 ml Systolic blood pressure 140 mmHg.   Plan to continue diuresis with furosemide  80 mg IV bid.  Continue metoprolol  and spironolactone    HTN (hypertension) Continue blood pressure control with metoprolol  and spironolactone .   H/O: CVA (cerebrovascular accident) Left carotid stenosis. Peripheral vascular disease.  Continue blood pressure control and statin   Protein-calorie malnutrition, severe Continue nutritional supplements.   Malignant neoplasm of upper-outer quadrant of right breast in female, estrogen receptor positive (HCC) Follow up as outpatient.        Subjective: Patient with improvement in her dyspnea but not yet back to baseline, edema is improving   Physical Exam: Vitals:   09/23/23 2104 09/23/23 2333 09/24/23 0424 09/24/23 0729  BP: 126/60 (!) 129/52 (!) 137/53 (!) 151/62  Pulse:  82 79 77  Resp:  17 18 18   Temp:  98.2 F (36.8 C) 98 F (36.7 C) 98.4 F (36.9 C)  TempSrc:   Oral Oral Oral  SpO2:  95% 93% 93%  Weight:   42.6 kg   Height:       Neurology awake and alert ENT with mild pallor Cardiovascular with S1 and S2 present and regular with no gallops or rubs Respiratory with prolonged expiratory phase with mild expiratory wheezing and bibasilar rales No JVD  Abdomen with no distention, soft Lower extremity edema + pitting bilaterally  Data Reviewed:    Family Communication: I spoke with patient's son at the bedside, we talked in detail about patient's condition, plan of care and prognosis and all questions were addressed.   Disposition: Status is: Inpatient Remains inpatient appropriate because: IV diuresis   Planned Discharge Destination: Home     Author: Elidia Toribio Furnace, MD 09/24/2023 7:41 AM  For on call review [email or web address].

## 2023-09-24 NOTE — Plan of Care (Signed)
  Problem: Education: Goal: Knowledge of General Education information will improve Description: Including pain rating scale, medication(s)/side effects and non-pharmacologic comfort measures 09/24/2023 1642 by Valora Devere Goodie, RN Outcome: Adequate for Discharge 09/24/2023 1642 by Valora Devere Goodie, RN Outcome: Progressing   Problem: Health Behavior/Discharge Planning: Goal: Ability to manage health-related needs will improve 09/24/2023 1642 by Valora Devere Goodie, RN Outcome: Adequate for Discharge 09/24/2023 1642 by Valora Devere Goodie, RN Outcome: Progressing   Problem: Clinical Measurements: Goal: Ability to maintain clinical measurements within normal limits will improve 09/24/2023 1642 by Valora Devere Goodie, RN Outcome: Adequate for Discharge 09/24/2023 1642 by Valora Devere Goodie, RN Outcome: Progressing Goal: Will remain free from infection 09/24/2023 1642 by Valora Devere Goodie, RN Outcome: Adequate for Discharge 09/24/2023 1642 by Valora Devere Goodie, RN Outcome: Progressing Goal: Diagnostic test results will improve 09/24/2023 1642 by Valora Devere Goodie, RN Outcome: Adequate for Discharge 09/24/2023 1642 by Valora Devere Goodie, RN Outcome: Progressing Goal: Respiratory complications will improve Outcome: Adequate for Discharge Goal: Cardiovascular complication will be avoided Outcome: Adequate for Discharge   Problem: Activity: Goal: Risk for activity intolerance will decrease Outcome: Adequate for Discharge   Problem: Nutrition: Goal: Adequate nutrition will be maintained Outcome: Adequate for Discharge   Problem: Coping: Goal: Level of anxiety will decrease Outcome: Adequate for Discharge   Problem: Elimination: Goal: Will not experience complications related to bowel motility Outcome: Adequate for Discharge Goal: Will not experience complications related to urinary retention Outcome: Adequate for Discharge   Problem: Pain  Managment: Goal: General experience of comfort will improve and/or be controlled Outcome: Adequate for Discharge   Problem: Safety: Goal: Ability to remain free from injury will improve Outcome: Adequate for Discharge   Problem: Skin Integrity: Goal: Risk for impaired skin integrity will decrease Outcome: Adequate for Discharge   Problem: Education: Goal: Ability to demonstrate management of disease process will improve Outcome: Adequate for Discharge Goal: Ability to verbalize understanding of medication therapies will improve Outcome: Adequate for Discharge Goal: Individualized Educational Video(s) Outcome: Adequate for Discharge   Problem: Activity: Goal: Capacity to carry out activities will improve Outcome: Adequate for Discharge   Problem: Cardiac: Goal: Ability to achieve and maintain adequate cardiopulmonary perfusion will improve Outcome: Adequate for Discharge

## 2023-09-24 NOTE — Plan of Care (Signed)

## 2023-09-24 NOTE — Plan of Care (Signed)

## 2023-09-24 NOTE — Plan of Care (Signed)

## 2023-09-25 ENCOUNTER — Other Ambulatory Visit: Payer: Self-pay

## 2023-09-25 ENCOUNTER — Other Ambulatory Visit (HOSPITAL_COMMUNITY): Payer: Self-pay

## 2023-09-25 DIAGNOSIS — I5033 Acute on chronic diastolic (congestive) heart failure: Secondary | ICD-10-CM | POA: Diagnosis not present

## 2023-09-25 DIAGNOSIS — I10 Essential (primary) hypertension: Secondary | ICD-10-CM | POA: Diagnosis not present

## 2023-09-25 DIAGNOSIS — J449 Chronic obstructive pulmonary disease, unspecified: Secondary | ICD-10-CM

## 2023-09-25 DIAGNOSIS — Z8673 Personal history of transient ischemic attack (TIA), and cerebral infarction without residual deficits: Secondary | ICD-10-CM | POA: Diagnosis not present

## 2023-09-25 DIAGNOSIS — E43 Unspecified severe protein-calorie malnutrition: Secondary | ICD-10-CM | POA: Diagnosis not present

## 2023-09-25 LAB — BASIC METABOLIC PANEL WITH GFR
Anion gap: 8 (ref 5–15)
BUN: 32 mg/dL — ABNORMAL HIGH (ref 8–23)
CO2: 33 mmol/L — ABNORMAL HIGH (ref 22–32)
Calcium: 8.6 mg/dL — ABNORMAL LOW (ref 8.9–10.3)
Chloride: 96 mmol/L — ABNORMAL LOW (ref 98–111)
Creatinine, Ser: 1.04 mg/dL — ABNORMAL HIGH (ref 0.44–1.00)
GFR, Estimated: 52 mL/min — ABNORMAL LOW (ref >60)
Glucose, Bld: 108 mg/dL — ABNORMAL HIGH (ref 70–99)
Potassium: 4.2 mmol/L (ref 3.5–5.1)
Sodium: 137 mmol/L (ref 135–145)

## 2023-09-25 LAB — CBC
HCT: 31.2 % — ABNORMAL LOW (ref 36.0–46.0)
Hemoglobin: 10.4 g/dL — ABNORMAL LOW (ref 12.0–15.0)
MCH: 39 pg — ABNORMAL HIGH (ref 26.0–34.0)
MCHC: 33.3 g/dL (ref 30.0–36.0)
MCV: 116.9 fL — ABNORMAL HIGH (ref 80.0–100.0)
Platelets: 178 K/uL (ref 150–400)
RBC: 2.67 MIL/uL — ABNORMAL LOW (ref 3.87–5.11)
RDW: 17.2 % — ABNORMAL HIGH (ref 11.5–15.5)
WBC: 5 K/uL (ref 4.0–10.5)
nRBC: 0 % (ref 0.0–0.2)

## 2023-09-25 LAB — MAGNESIUM: Magnesium: 1.9 mg/dL (ref 1.7–2.4)

## 2023-09-25 MED ORDER — MAGNESIUM SULFATE 2 GM/50ML IV SOLN
2.0000 g | Freq: Once | INTRAVENOUS | Status: AC
  Administered 2023-09-25: 2 g via INTRAVENOUS
  Filled 2023-09-25: qty 50

## 2023-09-25 MED ORDER — FLUTICASONE FUROATE-VILANTEROL 200-25 MCG/ACT IN AEPB
1.0000 | INHALATION_SPRAY | Freq: Every day | RESPIRATORY_TRACT | Status: DC
  Administered 2023-09-25 – 2023-09-29 (×7): 1 via RESPIRATORY_TRACT
  Filled 2023-09-25: qty 28

## 2023-09-25 MED ORDER — DAPAGLIFLOZIN PROPANEDIOL 10 MG PO TABS
10.0000 mg | ORAL_TABLET | Freq: Every day | ORAL | Status: DC
  Administered 2023-09-25 – 2023-09-29 (×7): 10 mg via ORAL
  Filled 2023-09-25 (×5): qty 1

## 2023-09-25 MED ORDER — FUROSEMIDE 40 MG PO TABS
80.0000 mg | ORAL_TABLET | Freq: Every day | ORAL | Status: DC
  Administered 2023-09-25 – 2023-09-26 (×2): 80 mg via ORAL
  Filled 2023-09-25 (×2): qty 2

## 2023-09-25 NOTE — Progress Notes (Signed)
 Progress Note   Patient: [place] Richards FMW:[id number] DOB: 01-24-1935 DOA: 09/21/2023     3 DOS: the patient was seen and examined on 09/25/2023   Brief hospital course: Madison Richards was admitted to the hospital with the working diagnosis of heart failure exacerbation.   89 y.o. female with medical history significant of hypertension, history of CVA, carotid artery disease, PAD, breast cancer presenting with edema. Reported worsening dyspnea and lower extremity edema, to the point where she has having difficulty ambulating. She had outpatient echocardiogram and her oncologist recommended her to come to the ED.  On her initial physical examination her blood pressure was 162/50, HR 137 to 64, RR 31 and 02 saturation 95%.  Lungs with with no wheezing or rhonchi, heart with S1 and S2 present and tachycardic, irregularly irregular, positive systolic murmur at the right lower sternal border, abdomen with no distention, positive lower extremity edema.   Na 142, K 3.4 Cl 106 bicarbonate 27, glucose 119 bun 31 cr 0,88  Mg 1,7  AST 42 and ALT 22  BNP 351 Wbc 7,4 hgb 11.1 plt 152   Chest radiograph with right rotation and hyperinflation, no cardiomegaly, bilateral hilar vascular congestion with cephalization of the vasculature, small right effusion.   EKG 139 bpm, normal axis, qtc 484, atrial fibrillation rhythm with PVC, ST segment depression V5 and V6, with no significant T wave changes.   Patient was placed on furosemide  IV with improvement in volume status.  Dyspnea component likely due to COPD, placed on inhaled corticosteroids and bronchodilator therapy.   Assessment and Plan: * Acute on chronic diastolic (congestive) heart failure (HCC) Echocardiogram with preserved LV systolic function with EF 70 to 75%, mild LVH, RV systolic function is preserved. RVSP 62,2 mmHg, mild LA dilatation, moderate RA dilatation, mild MR, with severe tricuspid valve regurgitation. No aortic stenosis.   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   Continue metoprolol  and spironolactone   Transition to po furosemide  and added SGLT 2 inh.   HTN (hypertension) Continue blood pressure control with metoprolol  and spironolactone .   H/O: CVA (cerebrovascular accident) Left carotid stenosis. Peripheral vascular disease.  Continue blood pressure control and statin   Protein-calorie malnutrition, severe Continue nutritional supplements.   COPD (chronic obstructive pulmonary disease) (HCC) Add inhaled corticosteroids and LABA Check ambulatory oxymetry on room air prior to discharge.   Malignant neoplasm of upper-outer quadrant of right breast in female, estrogen receptor positive (HCC) Follow up as outpatient.       Subjective: Patient is feeling better, dyspnea and edema continue to improve, no chest pain. She continue to have limited mobility.   Physical Exam: Vitals:   09/24/23 1553 09/24/23 1928 09/25/23 0003 09/25/23 0503  BP: (!) 125/47 122/76 131/62 (!) 126/57  Pulse: 80 78 77 74  Resp: 18 18 18 18   Temp: 98.8 F (37.1 C) 98.6 F (37 C) 98.5 F (36.9 C) 98.1 F (36.7 C)  TempSrc: Oral Oral Oral Oral  SpO2: 97% 97% 99% 93%  Weight:    42 kg  Height:       Neurology awake and alert ENT with mild pallor Cardiovascular with S1 and S2 present and regular, positive systolic murmur at the right lower sternal border No JVD Respiratory with prolonged expiratory phase with distant breath sounds and rales at bases, no wheezing Abdomen with no distention  Trace pitting lower extremity edema (improved)  Data Reviewed:  Family Communication: her friend is at the bedside   Disposition: Status is: Inpatient Remains inpatient appropriate because: recovering heart failure   Planned Discharge Destination: Home     Author: Elidia Toribio Furnace, MD 09/25/2023 7:32 AM  For on call review [email or web address].

## 2023-09-25 NOTE — TOC Benefit Eligibility Note (Signed)
 Pharmacy Patient Advocate Encounter  Insurance verification completed.    The patient is insured through [employer]. Patient has Medicare and is not eligible for a copay card, but may be able to apply for patient assistance or Medicare RX Payment Plan (Patient Must reach out to their plan, if eligible for payment plan), if available.    Ran test claim for Jardiance 10mg  and the current 30 day co-pay is $0.  Ran test claim for Farxiga 10mg  and the current 30 day co-pay is $0.   This test claim was processed through [employer]- copay amounts may vary at other pharmacies due to [employer], or as the patient moves through the different stages of their insurance plan.

## 2023-09-25 NOTE — Progress Notes (Addendum)
  Progress Note  Patient Name: Madison Richards Date of Encounter: 09/25/2023 [facility] Cardiologist: Gordy Bergamo, MD   Interval Summary   Has continued with excellent diuresis and now appears euvolemic, no edema, no JVD. Despite this she continues to require significant oxygen supplementation 6-7 L O2 by nasal cannula. Not sure the nasal cannula is helping much, she has severe nasal congestion and is breathing exclusively through her mouth. Has maintained normal sinus rhythm.  Vital Signs Vitals:   09/24/23 1928 09/25/23 0003 09/25/23 0503 09/25/23 0747  BP: 122/76 131/62 (!) 126/57 (!) 126/47  Pulse: 78 77 74 92  Resp: 18 18 18 18   Temp: 98.6 F (37 C) 98.5 F (36.9 C) 98.1 F (36.7 C) 98 F (36.7 C)  TempSrc: Oral Oral Oral Oral  SpO2: 97% 99% 93% 93%  Weight:   42 kg   Height:        Intake/Output Summary (Last 24 hours) at 09/25/2023 0859 Last data filed at 09/25/2023 0504 Gross per 24 hour  Intake 477 ml  Output 1925 ml  Net -1448 ml      09/25/2023    5:03 AM 09/24/2023    4:24 AM 09/23/2023    5:00 AM  Last 3 Weights  Weight (lbs) 92 lb 9.5 oz 93 lb 14.7 oz 98 lb 12.3 oz  Weight (kg) 42 kg 42.6 kg 44.8 kg      Telemetry/ECG  NSR.- Personally Reviewed  Physical Exam  GEN: No acute distress.   Neck: No JVD Cardiac: RRR, no murmurs, rubs, or gallops.  Respiratory: Distant breath sounds bilaterally, fewer rhonchi compared to yesterday. GI: Soft, nontender, non-distended  MS: No edema  Assessment & Plan   CHF: Appears to have reached euvolemia.  Will mark her current weight of 92.5 pounds (42 kg) as her dry weight.  Change to oral loop diuretics.  Continue spironolactone .  Add SGLT2 inhibitor. COPD: Suspect this, may be also some atelectasis is the reason for her continued hypoxia.  Nasal congestion limits usefulness of her O2 by [place]. Mobilize.  Incentive spirometry.  Start bronchodilators and anticholinergics. Saline nasal spray. PAH/TR: Combined WHO  group 2 and group 3 mechanism, repeat echocardiogram to assess PA pressure and severity of tricuspid regurgitation after discharge, in roughly 8 weeks. AFib: No recurrence in last 96 hours.  On oral anticoagulants. Breast Ca: there is a small chance that Verzenio  played a role in her AFib/CHF decompensation. I think the only way we will be able to tell is with a re-challenge. Review options with her Oncologist.   For questions or updates, please contact  [facility] Please consult [email or web address] for contact info under       Signed, Jerel Balding, MD

## 2023-09-25 NOTE — Plan of Care (Signed)
  Problem: Clinical Measurements: Goal: Respiratory complications will improve Outcome: Progressing   Problem: Nutrition: Goal: Adequate nutrition will be maintained Outcome: Progressing   Problem: Coping: Goal: Level of anxiety will decrease Outcome: Progressing   

## 2023-09-25 NOTE — TOC Initial Note (Signed)
 Transition of Care [place] Richards) - Initial/Assessment Note    Patient Details  Name: Madison Richards MRN: [id number] Date of Birth: [birth year]  Transition of Care [place] Richards) CM/SW Contact:    Madison JULIANNA George, RN Phone Number: 09/25/2023, 11:55 AM  Clinical Narrative:                  PCP: Madison Richards  Pt is from home alone. She stats her daughter can check on her as she lives within 5 minutes.  Uses cane at home and has a BSC.  Pt drives self but says family can assist if needed.  She manages her own medications.  Watching to see if may need home oxygen. IP Care Management following for d/c needs.    Expected Discharge Plan: Home/Self Care Barriers to Discharge: Continued Medical Work up   Patient Goals and CMS Choice            Expected Discharge Plan and Services   Discharge Planning Services: CM Consult   Living arrangements for the past 2 months: Single Family Home                                      Prior Living Arrangements/Services Living arrangements for the past 2 months: Single Family Home Lives with:: Self Patient language and need for interpreter reviewed:: Yes Do you feel safe going back to the place where you live?: Yes          Current home services: DME (cane/ BSC) Criminal Activity/Legal Involvement Pertinent to Current Situation/Hospitalization: No - Comment as needed  Activities of Daily Living   ADL Screening (condition at time of admission) Independently performs ADLs?: Yes (appropriate for developmental age) Is the patient deaf or have difficulty hearing?: No Does the patient have difficulty seeing, even when wearing glasses/contacts?: No Does the patient have difficulty concentrating, remembering, or making decisions?: No  Permission Sought/Granted                  Emotional Assessment Appearance:: Appears stated age Attitude/Demeanor/Rapport: Engaged Affect (typically observed): Accepting Orientation: : Oriented to Self,  Oriented to Place, Oriented to  Time, Oriented to Situation   Psych Involvement: No (comment)  Admission diagnosis:  Acute on chronic diastolic (congestive) heart failure (HCC) [I50.33] Tricuspid valve insufficiency, unspecified etiology [I07.1] Acute hypoxic respiratory failure (HCC) [J96.01] Patient Active Problem List   Diagnosis Date Noted   Protein-calorie malnutrition, severe 09/23/2023   Severe tricuspid regurgitation 09/21/2023   Acute on chronic diastolic (congestive) heart failure (HCC) 09/21/2023   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 [place] Richards) 08/06/2022   Genetic testing 08/04/2022   Malignant neoplasm of upper-outer quadrant of right breast in female, estrogen receptor positive (HCC) 07/15/2022   Claudication in peripheral vascular disease (HCC) 12/06/2018   H/O: CVA (cerebrovascular accident) 12/06/2018   HTN (hypertension) 02/09/2018   PAD (peripheral artery disease) (HCC) 02/09/2018   Left carotid artery stenosis 02/09/2018   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 [facility] [place]  [place] [address] Phone: [phone] Fax: [phone]  [facility] [address] [place] [address] Phone: [phone] Fax: [phone]     Social Drivers of Health (SDOH) Social History: SDOH Screenings   Food Insecurity: No Food Insecurity (09/21/2023)  Housing: Low Risk  (09/21/2023)  Transportation Needs: No Transportation Needs (09/21/2023)  Utilities: Not At Risk (09/21/2023)  Depression (PHQ2-9): Low Risk  (03/19/2018)  Social  Connections: Unknown (09/21/2023)  Tobacco Use: Medium Risk (09/21/2023)   SDOH Interventions:     Readmission Risk Interventions    09/22/2023    2:39 PM  Readmission Risk Prevention Plan  Transportation Screening Complete  Home Care Screening Complete  Medication Review (RN CM) Complete

## 2023-09-25 NOTE — Progress Notes (Signed)
 Mobility Specialist Progress Note:   09/25/23 1450  Mobility  Activity Pivoted/transferred from chair to bed  Level of Assistance Standby assist, set-up cues, supervision of patient - no hands on  Assistive Device None  Distance Ambulated (ft) 3 ft  Activity Response Tolerated well  Mobility Referral Yes  Mobility visit 1 Mobility  Mobility Specialist Start Time (ACUTE ONLY) 1450  Mobility Specialist Stop Time (ACUTE ONLY) 1500  Mobility Specialist Time Calculation (min) (ACUTE ONLY) 10 min   Pt agreeable to mobility session, requesting transfer to bed. No physical assistance needed. Pt back in bed with all needs met.   Therisa Rana Mobility Specialist Please contact via SecureChat or  Rehab office at [phone]

## 2023-09-25 NOTE — Assessment & Plan Note (Addendum)
 Continue with inhaled corticosteroids and LABA  As needed duoneb.  Continue with airway clearing techniques with flutter valve and incentive spirometer.  Outpatient home health services and referral for pulmonary. Patient will benefit from cardiopulmonary rehabilitation.   CT chest with extensive emphysema centrilobular and paraseptal.  Smoking cessation  Supplemental 02 at home per [place]

## 2023-09-26 DIAGNOSIS — E43 Unspecified severe protein-calorie malnutrition: Secondary | ICD-10-CM | POA: Diagnosis not present

## 2023-09-26 DIAGNOSIS — I10 Essential (primary) hypertension: Secondary | ICD-10-CM | POA: Diagnosis not present

## 2023-09-26 DIAGNOSIS — Z8673 Personal history of transient ischemic attack (TIA), and cerebral infarction without residual deficits: Secondary | ICD-10-CM | POA: Diagnosis not present

## 2023-09-26 DIAGNOSIS — I5033 Acute on chronic diastolic (congestive) heart failure: Secondary | ICD-10-CM | POA: Diagnosis not present

## 2023-09-26 LAB — BASIC METABOLIC PANEL WITH GFR
Anion gap: 7 (ref 5–15)
BUN: 32 mg/dL — ABNORMAL HIGH (ref 8–23)
CO2: 32 mmol/L (ref 22–32)
Calcium: 8.6 mg/dL — ABNORMAL LOW (ref 8.9–10.3)
Chloride: 97 mmol/L — ABNORMAL LOW (ref 98–111)
Creatinine, Ser: 1.15 mg/dL — ABNORMAL HIGH (ref 0.44–1.00)
GFR, Estimated: 46 mL/min — ABNORMAL LOW (ref >60)
Glucose, Bld: 104 mg/dL — ABNORMAL HIGH (ref 70–99)
Potassium: 4.2 mmol/L (ref 3.5–5.1)
Sodium: 136 mmol/L (ref 135–145)

## 2023-09-26 LAB — MAGNESIUM: Magnesium: 2.2 mg/dL (ref 1.7–2.4)

## 2023-09-26 MED ORDER — IPRATROPIUM-ALBUTEROL 0.5-2.5 (3) MG/3ML IN SOLN
3.0000 mL | Freq: Four times a day (QID) | RESPIRATORY_TRACT | Status: DC
  Administered 2023-09-26 – 2023-09-27 (×4): 3 mL via RESPIRATORY_TRACT
  Filled 2023-09-26 (×5): qty 3

## 2023-09-26 MED ORDER — FLUTICASONE PROPIONATE 50 MCG/ACT NA SUSP
2.0000 | Freq: Two times a day (BID) | NASAL | Status: DC
  Administered 2023-09-26 – 2023-09-29 (×10): 2 via NASAL
  Filled 2023-09-26: qty 16

## 2023-09-26 MED ORDER — ALBUTEROL SULFATE (2.5 MG/3ML) 0.083% IN NEBU: 2.5000 mg | INHALATION_SOLUTION | RESPIRATORY_TRACT | Status: DC | PRN

## 2023-09-26 NOTE — Progress Notes (Signed)
 RT note. Patient found on 4 L HFNC sat 86% with good wave form. Patient sat goal is 92%, patient placed on 9 L HFNC sat 93%. No labored breathing noted at this time, Patient re instructed on the use of flutter valve, RT will continue to monitor.    09/26/23 0805  Therapy Vitals  Pulse Rate 78  Resp (!) 22  Respiratory Assessment  Assessment Type Mid-treatment  Respiratory Pattern Regular;Unlabored;Dyspnea with exertion  Chest Assessment Chest expansion symmetrical  Bilateral Breath Sounds Clear;Diminished  Oxygen Therapy/Pulse Ox  O2 Device (S)  HFNC  O2 Therapy Oxygen humidified  O2 Flow Rate (L/min) (S)  9 L/min  SpO2 93 %

## 2023-09-26 NOTE — Progress Notes (Signed)
 Progress Note  Patient Name: Madison Richards Date of Encounter: 09/26/2023  Primary Cardiologist:   Gordy Bergamo, MD   Subjective   She feels 98% better than when I came in.  Still with cough and dyspnea.  Not at baseline.    Inpatient Medications    Scheduled Meds:  apixaban   2.5 mg Oral BID   ascorbic acid   500 mg Oral Daily   colestipol   1 g Oral Daily   dapagliflozin  propanediol  10 mg Oral Daily   ezetimibe   10 mg Oral Daily   feeding supplement  237 mL Oral TID BM   fluticasone  furoate-vilanterol  1 puff Inhalation Daily   folic acid   1 mg Oral Daily   furosemide   80 mg Oral Daily   letrozole   2.5 mg Oral Daily   levalbuterol   0.63 mg Inhalation BID   metoprolol  tartrate  25 mg Oral BID   multivitamin with minerals  1 tablet Oral Daily   pravastatin   80 mg Oral QHS   sodium chloride  flush  3 mL Intravenous Q12H   spironolactone   25 mg Oral Daily   umeclidinium bromide   1 puff Inhalation Daily   Continuous Infusions:  PRN Meds: acetaminophen  **OR** acetaminophen , guaiFENesin -dextromethorphan , polyethylene glycol, sodium chloride    Vital Signs    Vitals:   09/25/23 2359 09/26/23 0423 09/26/23 0805 09/26/23 0807  BP: (!) 142/56 (!) 134/57    Pulse: 72 76 78   Resp: 17 18 (!) 22   Temp: 98.2 F (36.8 C) 98.2 F (36.8 C)    TempSrc: Oral Oral    SpO2: 94% 90% 93% 100%  Weight:  40.6 kg    Height:        Intake/Output Summary (Last 24 hours) at 09/26/2023 0816 Last data filed at 09/26/2023 0424 Gross per 24 hour  Intake 480 ml  Output 450 ml  Net 30 ml   Filed Weights   09/24/23 0424 09/25/23 0503 09/26/23 0423  Weight: 42.6 kg 42 kg 40.6 kg    Telemetry  NSR with infrequent PVCs - Personally Reviewed  ECG    NA - Personally Reviewed  Physical Exam   GEN: No acute distress.   Neck: No  JVD Cardiac: RRR, 3/6 apical systolic murmur, no diastolic murmurs, rubs, or gallops.  Respiratory:      Decreased breath sounds with coarse crackles.  GI:  Soft, nontender, non-distended  MS: No  edema; No deformity. Neuro:  Nonfocal  Psych: Normal affect   Labs    Chemistry Recent Labs  Lab 09/21/23 1023 09/22/23 0334 09/23/23 0302 09/24/23 0250 09/25/23 0318 09/26/23 0237  NA 142 140   < > 136 137 136  K 3.4* 3.7   < > 4.3 4.2 4.2  CL 106 106   < > 97* 96* 97*  CO2 27 29   < > 32 33* 32  GLUCOSE 119* 90   < > 114* 108* 104*  BUN 31* 29*   < > 29* 32* 32*  CREATININE 0.88 0.95   < > 0.95 1.04* 1.15*  CALCIUM 9.6 8.9   < > 8.1* 8.6* 8.6*  PROT 5.4* 4.8*  --   --   --   --   ALBUMIN 2.2* 2.0*  --   --   --   --   AST 42* 38  --   --   --   --   ALT 22 19  --   --   --   --  ALKPHOS 161* 144*  --   --   --   --   BILITOT 0.9 0.7  --   --   --   --   GFRNONAA >60 58*   < > 58* 52* 46*  ANIONGAP 9 5   < > 7 8 7    < > = values in this interval not displayed.     Hematology Recent Labs  Lab 09/23/23 0302 09/24/23 0250 09/25/23 0318  WBC 4.6 4.8 5.0  RBC 2.61* 2.46* 2.67*  HGB 10.2* 9.6* 10.4*  HCT 30.2* 28.6* 31.2*  MCV 115.7* 116.3* 116.9*  MCH 39.1* 39.0* 39.0*  MCHC 33.8 33.6 33.3  RDW 17.1* 17.1* 17.2*  PLT 157 123* 178    Cardiac EnzymesNo results for input(s): TROPONINI in the last 168 hours. No results for input(s): TROPIPOC in the last 168 hours.   BNP Recent Labs  Lab 09/21/23 1023  BNP 351.5*     DDimer No results for input(s): DDIMER in the last 168 hours.   Radiology    No results found.  Cardiac Studies   Echo:  1. Left ventricular ejection fraction, by estimation, is 70 to 75%. Left  ventricular ejection fraction by 2D MOD biplane is 73.9 %. Left  ventricular ejection fraction by PLAX is 64 %. The left ventricle has  hyperdynamic function. The left ventricle has  no regional wall motion abnormalities. There is mild concentric left  ventricular hypertrophy. Left ventricular diastolic parameters are  consistent with Grade I diastolic dysfunction (impaired relaxation). The   average left ventricular global longitudinal  strain is -24.0 %. The global longitudinal strain is normal.   2. Right ventricular systolic function is normal. The right ventricular  size is moderately enlarged. There is severely elevated pulmonary artery  systolic pressure. The estimated right ventricular systolic pressure is  62.6 mmHg.   3. Left atrial size was mildly dilated.   4. Right atrial size was moderately dilated.   5. Large pleural effusion in the left lateral region.   6. The mitral valve is degenerative. Mild mitral valve regurgitation. No  evidence of mitral stenosis. Moderate mitral annular calcification.   7. Multiple TR jets. Tricuspid valve regurgitation is severe.   8. The aortic valve is tricuspid. There is moderate calcification of the  aortic valve. Aortic valve regurgitation is not visualized. Aortic valve  sclerosis/calcification is present, without any evidence of aortic  stenosis.   9. The inferior vena cava is dilated in size with <50% respiratory  variability, suggesting right atrial pressure of 15 mmHg.   Patient Profile     89 y.o. female  with medical history significant of hypertension, history of CVA, carotid artery disease, PAD, breast cancer presenting with edema. Presented with  worsening dyspnea and lower extremity edema, to the point where she has having difficulty ambulating. She had outpatient echocardiogram and her oncologist recommended her to come to the ED.  [name]  Assessment & Plan    CHF: Intake and output was net -5256 this admission.    She was changed to oral Lasix .  She is on spironolactone .  SGLT2 inhibitor was added.   We will have to watch her creatinine at discharge.  Is creeping up.  It does not appear that she was taking home diuretic.  Currently on 80 mg p.o.  Discharge dose might be  20 or 40 mg.    COPD: Suspect an element of COPD and atelectasis.  Plan per primary team.  She will likely  need home O2.   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   Plan follow-up echocardiogram as an outpatient to reassess pulmonary pressures.    AFib:   Maintaining sinus rhythm.  Continue oral medications.    Breast Ca:    Follow on therapy for the risk of recurrent fibrillation flutter with Verzenio .      For questions or updates, please contact [facility] Please consult [email or web address] for contact info under Cardiology/STEMI.   Signed, Lynwood Schilling, MD  09/26/2023, 8:16 AM

## 2023-09-26 NOTE — Evaluation (Signed)
 Occupational Therapy Evaluation Patient Details Name: Madison Richards MRN: [id number] DOB: March 03, 1934 Today's Date: 09/26/2023   History of Present Illness   Pt is a 89 y.o female admitted 8/4 for SOB. 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 PTA pt was independent with ADLs and IADLs. Today pt is requiring s for safety to CGA for ADLs. Bed mobility was mod I and functional transfers are  CGA for short distance transfers without AD. Pt currently limited by decreased activity tolerance. Given pt primarily lives alone, it is recommended that that pt d/c with HHOT to increase independence with IADLs, and promote safety with potential new O2 line. OT will continue to follow acutely to maximize functional independence.        If plan is discharge home, recommend the following:   A little help with walking and/or transfers;A little help with bathing/dressing/bathroom;Assistance with cooking/housework     Functional Status Assessment   Patient has had a recent decline in their functional status and demonstrates the ability to make significant improvements in function in a reasonable and predictable amount of time.     Equipment Recommendations   Tub/shower seat      Precautions/Restrictions   Precautions Precautions: Fall Recall of Precautions/Restrictions: Intact Restrictions Weight Bearing Restrictions Per Provider Order: No     Mobility Bed Mobility Overal bed mobility: Modified Independent     General bed mobility comments: No assist, HOB minimally elevated    Transfers Overall transfer level: Needs assistance Equipment used: None Transfers: Sit to/from Stand, Bed to chair/wheelchair/BSC Sit to Stand: Supervision     Step pivot transfers: Contact guard assist      General transfer comment: S for safety with standing, CGA for short step pivot to [facility] and recliner without AD      Balance Overall balance assessment: Needs assistance Sitting-balance support: No upper extremity supported, Feet supported Sitting balance-Leahy Scale: Good     Standing balance support: No upper extremity supported, During functional activity Standing balance-Leahy Scale: Poor Standing balance comment: Benefits from UE support during mobility         ADL either performed or assessed with clinical judgement   ADL Overall ADL's : Needs assistance/impaired Eating/Feeding: Set up;Sitting   Grooming: Set up;Sitting           Upper Body Dressing : Set up;Sitting   Lower Body Dressing: Sit to/from stand;Supervision/safety Lower Body Dressing Details (indicate cue type and reason): S for safety in standing Toilet Transfer: Contact guard assist;Ambulation;BSC/3in1 Toilet Transfer Details (indicate cue type and reason): Short distance to [facility], no AD Toileting- Clothing Manipulation and Hygiene: Sit to/from stand;Supervision/safety Toileting - Clothing Manipulation Details (indicate cue type and reason): s for safety with perihygiene in standing     Functional mobility during ADLs: Contact guard assist General ADL Comments: Decreased activity tolerance limiting pt     Vision Baseline Vision/History: 1 Wears glasses Patient Visual Report: No change from baseline Vision Assessment?: No apparent visual deficits            Pertinent Vitals/Pain Pain Assessment Pain Assessment: No/denies pain     Extremity/Trunk Assessment Upper Extremity Assessment Upper Extremity Assessment: Generalized weakness   Lower Extremity Assessment Lower Extremity Assessment: Defer to PT evaluation   Cervical / Trunk Assessment Cervical / Trunk Assessment: Normal  Communication Communication Communication: No apparent difficulties   Cognition Arousal: Alert Behavior During  Therapy: WFL for tasks assessed/performed Cognition: No apparent impairments       Following commands: Intact       Cueing  General Comments   Cueing Techniques: Verbal cues  Pt on 8L during mobility destat to 88%, increased to 9L, unable to get consistent pleth reading post mobility           Home Living Family/patient expects to be discharged to:: Private residence Living Arrangements: Alone Available Help at Discharge: Family;Available PRN/intermittently;Friend(s) Type of Home: House Home Access: Level entry     Home Layout: One level     Bathroom Shower/Tub: Producer, television/film/video: Handicapped height Bathroom Accessibility: No   Home Equipment: Rollator (4 wheels);Cane - single point;BSC/3in1;Grab bars - toilet (3 wheeled walker)   Additional Comments: Per pt, different family member or friend checks in daily      Prior Functioning/Environment Prior Level of Function : Independent/Modified Independent         Mobility Comments: Primarily uses rollator or furniture walks in house ADLs Comments: Manages most IADLs, has cleaning service 1x a month    OT Problem List: Decreased strength;Decreased range of motion;Decreased activity tolerance;Impaired balance (sitting and/or standing);Decreased safety awareness;Decreased knowledge of use of DME or AE;Cardiopulmonary status limiting activity   OT Treatment/Interventions: Therapeutic exercise;Self-care/ADL training;Energy conservation;DME and/or AE instruction;Therapeutic activities;Patient/family education;Balance training      OT Goals(Current goals can be found in the care plan section)   Acute Rehab OT Goals Patient Stated Goal: To get better OT Goal Formulation: With patient Time For Goal Achievement: 10/10/23 Potential to Achieve Goals: Good   OT Frequency:  Min 2X/week       AM-PAC OT 6 Clicks Daily Activity     Outcome Measure Help from another person eating meals?: None Help from  another person taking care of personal grooming?: A Little Help from another person toileting, which includes using toliet, bedpan, or urinal?: A Little Help from another person bathing (including washing, rinsing, drying)?: A Little Help from another person to put on and taking off regular upper body clothing?: A Little Help from another person to put on and taking off regular lower body clothing?: A Little 6 Click Score: 19   End of Session Equipment Utilized During Treatment: Oxygen Nurse Communication: Mobility status  Activity Tolerance: Patient tolerated treatment well Patient left: in chair;with call bell/phone within reach  OT Visit Diagnosis: Unsteadiness on feet (R26.81);Other abnormalities of gait and mobility (R26.89);Muscle weakness (generalized) (M62.81)                Time: 9191-9160 OT Time Calculation (min): 31 min Charges:  OT General Charges $OT Visit: 1 Visit OT Evaluation $OT Eval Moderate Complexity: 1 Mod OT Treatments $Self Care/Home Management : 8-22 mins  Adrianne BROCKS, OT  Acute Rehabilitation Services Office [phone] Secure chat preferred   Adrianne GORMAN Savers 09/26/2023, 9:00 AM

## 2023-09-26 NOTE — Evaluation (Signed)
 Physical Therapy Evaluation Patient Details Name: Madison Richards MRN: [id number] DOB: [birth year] Today's Date: 09/26/2023  History of Present Illness  Pt is a 89 y.o female admitted 8/4 for SOB. Chest x-ray showed small bil effusions w/ atelectasis. 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  Clinical Impression  Patient lives alone in one level home, with PRN assist from family/friends. Reports recently started using [facility] within her home and 4WW for ambulation outside of home. Patient presents today with poor functional capacity, impaired activity tolerance, generalized weakness, and deficits in gait. Patient completes transfers with supervision assist and ambulates 10 feet at best with 2WW and CGA. Patient limited in ambulation distance secondary to inability to maintain O2 > 92% spO2 despite currently being on 8L/min via high flow [employer]. Patient will continue to benefit from skilled acute PT services to address the above impairments. When medically stable, recommend return home with initial increased assistance and HHPT.        If plan is discharge home, recommend the following: A little help with walking and/or transfers;A little help with bathing/dressing/bathroom;Assistance with cooking/housework   Can travel by private vehicle        Equipment Recommendations Other (comment) (Will continue to assess.)  Recommendations for Other Services       Functional Status Assessment Patient has had a recent decline in their functional status and demonstrates the ability to make significant improvements in function in a reasonable and predictable amount of time.     Precautions / Restrictions Precautions Precautions: Fall Recall of Precautions/Restrictions: Intact Precaution/Restrictions Comments: Per RT, sat goal >92% spO2. Restrictions Weight Bearing Restrictions Per Provider Order: No      Mobility  Bed Mobility Overal bed mobility: Needs  Assistance Bed Mobility: Supine to Sit     Supine to sit: Supervision          Transfers Overall transfer level: Needs assistance Equipment used: Rollator (4 wheels) Transfers: Sit to/from Stand Sit to Stand: Supervision           General transfer comment: cues for hand placement    Ambulation/Gait Ambulation/Gait assistance: Contact guard assist Gait Distance (Feet): 10 Feet (10 feet x2 with seated rest break in between for O2 recovery) Assistive device: Rollator (4 wheels) Gait Pattern/deviations: Decreased step length - right, Decreased step length - left, Narrow base of support   Gait velocity interpretation: <1.31 ft/sec, indicative of household ambulator   General Gait Details: reduced gait speed  Stairs            Wheelchair Mobility     Tilt Bed    Modified Rankin (Stroke Patients Only)       Balance Overall balance assessment: Needs assistance Sitting-balance support: No upper extremity supported, Feet supported Sitting balance-Leahy Scale: Good     Standing balance support: Bilateral upper extremity supported Standing balance-Leahy Scale: Poor Standing balance comment: UE support for ambulation. Able to perform static standing, unsupported with no more than supervision.                             Pertinent Vitals/Pain Pain Assessment Pain Assessment: No/denies pain    Home Living Family/patient expects to be discharged to:: Private residence Living Arrangements: Alone Available Help at Discharge: Family;Available PRN/intermittently;Friend(s) Type of Home: House Home Access: Level entry       Home Layout: One level Home Equipment: Cane - single point;BSC/3in1;Grab bars - toilet;Rollator (  4 wheels)      Prior Function Prior Level of Function : Independent/Modified Independent             Mobility Comments: Patient reports she just started using a SPC several weeks ago. 5TT for community ambulation.  (+)  driving       Extremity/Trunk Assessment        Lower Extremity Assessment Lower Extremity Assessment: Generalized weakness    Cervical / Trunk Assessment Cervical / Trunk Assessment: Kyphotic  Communication   Communication Communication: No apparent difficulties    Cognition Arousal: Alert Behavior During Therapy: WFL for tasks assessed/performed                             Following commands: Intact       Cueing Cueing Techniques: Verbal cues, Gestural cues     General Comments General comments (skin integrity, edema, etc.): Patient remained on 8L/min of HFNC. O2 sats at rest 93-98% spO2. With initial 10 feet of gait, O2 briefly dropped to 78% spO2, quick recovery in sitting < 20 seconds. Second gait trial maintained 94% spO2 with gait but when return to sititng, brief drop to 82% spO2.    Exercises     Assessment/Plan    PT Assessment Patient needs continued PT services  PT Problem List Decreased strength;Decreased balance;Decreased knowledge of precautions;Cardiopulmonary status limiting activity;Decreased knowledge of use of DME;Decreased mobility;Decreased activity tolerance;Decreased safety awareness       PT Treatment Interventions DME instruction;Functional mobility training;Balance training;Gait training;Therapeutic activities;Therapeutic exercise;Neuromuscular re-education;Patient/family education    PT Goals (Current goals can be found in the Care Plan section)  Acute Rehab PT Goals Patient Stated Goal: Patient's goal is to return home. PT Goal Formulation: With patient Time For Goal Achievement: 10/10/23 Potential to Achieve Goals: Good    Frequency Min 2X/week     Co-evaluation               AM-PAC PT 6 Clicks Mobility  Outcome Measure Help needed turning from your back to your side while in a flat bed without using bedrails?: None Help needed moving from lying on your back to sitting on the side of a flat bed without  using bedrails?: A Little Help needed moving to and from a bed to a chair (including a wheelchair)?: A Little Help needed standing up from a chair using your arms (e.g., wheelchair or bedside chair)?: A Little Help needed to walk in hospital room?: A Little Help needed climbing 3-5 steps with a railing? : A Lot 6 Click Score: 18    End of Session Equipment Utilized During Treatment: Gait belt;Oxygen Activity Tolerance: Patient tolerated treatment well Patient left: in chair;with call bell/phone within reach;Other (comment) (MD present) Nurse Communication: Mobility status;Other (comment) (O2 sats with mobility) PT Visit Diagnosis: Unsteadiness on feet (R26.81);Other abnormalities of gait and mobility (R26.89);Muscle weakness (generalized) (M62.81);Difficulty in walking, not elsewhere classified (R26.2)    Time: 9084-9057 PT Time Calculation (min) (ACUTE ONLY): 27 min   Charges:   PT Evaluation $PT Eval Moderate Complexity: 1 Mod PT Treatments $Therapeutic Activity: 8-22 mins           Sherryle Friesland, PT, DPT [facility] Acute Rehabilitation Office: [phone]   Sherryle VEAR [place] 09/26/2023, 2:09 PM

## 2023-09-26 NOTE — Progress Notes (Addendum)
 Progress Note   Patient: Madison Richards FMW:[id number] DOB: 1934-07-27 DOA: 09/21/2023     4 DOS: the patient was seen and examined on 09/26/2023   Brief hospital course: [name] was admitted to the hospital with the working diagnosis of heart failure exacerbation.   89 y.o. female with medical history significant of hypertension, history of CVA, carotid artery disease, PAD, breast cancer presenting with edema. Reported worsening dyspnea and lower extremity edema, to the point where she has having difficulty ambulating. She had outpatient echocardiogram and her oncologist recommended her to come to the ED.  On her initial physical examination her blood pressure was 162/50, HR 137 to 64, RR 31 and 02 saturation 95%.  Lungs with with no wheezing or rhonchi, heart with S1 and S2 present and tachycardic, irregularly irregular, positive systolic murmur at the right lower sternal border, abdomen with no distention, positive lower extremity edema.   Na 142, K 3.4 Cl 106 bicarbonate 27, glucose 119 bun 31 cr 0,88  Mg 1,7  AST 42 and ALT 22  BNP 351 Wbc 7,4 hgb 11.1 plt 152   Chest radiograph with right rotation and hyperinflation, no cardiomegaly, bilateral hilar vascular congestion with cephalization of the vasculature, small right effusion.   EKG 139 bpm, normal axis, qtc 484, atrial fibrillation rhythm with PVC, ST segment depression V5 and V6, with no significant T wave changes.   Patient was placed on furosemide  IV with improvement in volume status.  Dyspnea component likely due to COPD, placed on inhaled corticosteroids and bronchodilator therapy.   Assessment and Plan: * Acute on chronic diastolic (congestive) heart failure (HCC) Echocardiogram with preserved LV systolic function with EF 70 to 75%, mild LVH, RV systolic function is preserved. RVSP 62,2 mmHg, mild LA dilatation, moderate RA dilatation, mild MR, with severe tricuspid valve regurgitation. No aortic stenosis.   Acute on  chronic core pulmonale  Pulmonary hypertension, likely group 2 or 3    Fluid balance negative, since admission is - 5,256 ml.  Systolic blood pressure 130 mmHg.   Continue metoprolol  and spironolactone   On 80 mg of daily furosemide  and SGLT 2 inh.  May need lower dose of loop diuretic at home   Acute hypoxemic respiratory failure, due to acute cardiogenic pulmonary edema, likely on chronic hypoxemia due to COPD.  Her 02 saturation today is 93% on 9 L per [facility]NC. Continue diuresis, and bronchodilator therapy.  Likely patient will need supplemental home 02 at the time of her discharge.   HTN (hypertension) Continue blood pressure control with metoprolol  and spironolactone .   H/O: CVA (cerebrovascular accident) Left carotid stenosis. Peripheral vascular disease.  Continue blood pressure control and statin   Protein-calorie malnutrition, severe Continue nutritional supplements.   COPD (chronic obstructive pulmonary disease) (HCC) Worsening hypoxemia, today with increased 02 requirements to 9 L/min per HFNC Continue with inhaled corticosteroids and LABA  Add scheduled duoneb.  Continue with airway clearing techniques with flutter valve and incentive spirometer.  Out of bed to chair tid with meals.  PT and OT.   Check ambulatory oxymetry on room air prior to discharge.   Malignant neoplasm of upper-outer quadrant of right breast in female, estrogen receptor positive (HCC) Follow up as outpatient.      Subjective: Patient with improvement in her symptoms but not yet back to baseline, today with increased 02 requirements and increased sputum production. No chest pain, edema is improving   Physical Exam: Vitals:   09/25/23 2359 09/26/23 0423 09/26/23 0805 09/26/23  0807  BP: (!) 142/56 (!) 134/57    Pulse: 72 76 78   Resp: 17 18 (!) 22   Temp: 98.2 F (36.8 C) 98.2 F (36.8 C)    TempSrc: Oral Oral    SpO2: 94% 90% 93% 100%  Weight:  40.6 kg    Height:       Neurology  awake and alert ENT with mild pallor Cardiovascular with S1 and S2 present and regular with no gallops or rubs,  Positive systolic murmur at the right lower sternal border Respiratory with bilateral rhonchi, no rales or wheezing, distant breath sounds with poor inspiratory effort, prolonged expiratory phase Abdomen with no distention  Trace pitting lower extremity edema, ted hose in place  Data Reviewed:    Family Communication: friend at the beside   Disposition: Status is: Inpatient Remains inpatient appropriate because: recovering heart failure, hypoxemia   Planned Discharge Destination: Home    Author: Elidia Toribio Furnace, MD 09/26/2023 9:56 AM  For on call review [email or web address].

## 2023-09-27 DIAGNOSIS — J449 Chronic obstructive pulmonary disease, unspecified: Secondary | ICD-10-CM | POA: Diagnosis not present

## 2023-09-27 DIAGNOSIS — E43 Unspecified severe protein-calorie malnutrition: Secondary | ICD-10-CM | POA: Diagnosis not present

## 2023-09-27 DIAGNOSIS — Z8673 Personal history of transient ischemic attack (TIA), and cerebral infarction without residual deficits: Secondary | ICD-10-CM | POA: Diagnosis not present

## 2023-09-27 DIAGNOSIS — I5033 Acute on chronic diastolic (congestive) heart failure: Secondary | ICD-10-CM | POA: Diagnosis not present

## 2023-09-27 DIAGNOSIS — I10 Essential (primary) hypertension: Secondary | ICD-10-CM | POA: Diagnosis not present

## 2023-09-27 LAB — BASIC METABOLIC PANEL WITH GFR
Anion gap: 7 (ref 5–15)
BUN: 29 mg/dL — ABNORMAL HIGH (ref 8–23)
CO2: 32 mmol/L (ref 22–32)
Calcium: 8.5 mg/dL — ABNORMAL LOW (ref 8.9–10.3)
Chloride: 96 mmol/L — ABNORMAL LOW (ref 98–111)
Creatinine, Ser: 1.13 mg/dL — ABNORMAL HIGH (ref 0.44–1.00)
GFR, Estimated: 47 mL/min — ABNORMAL LOW (ref >60)
Glucose, Bld: 97 mg/dL (ref 70–99)
Potassium: 4.1 mmol/L (ref 3.5–5.1)
Sodium: 135 mmol/L (ref 135–145)

## 2023-09-27 MED ORDER — FUROSEMIDE 40 MG PO TABS
40.0000 mg | ORAL_TABLET | Freq: Every day | ORAL | Status: DC
  Administered 2023-09-27 – 2023-09-29 (×5): 40 mg via ORAL
  Filled 2023-09-27 (×3): qty 1

## 2023-09-27 MED ORDER — IPRATROPIUM-ALBUTEROL 0.5-2.5 (3) MG/3ML IN SOLN
3.0000 mL | Freq: Three times a day (TID) | RESPIRATORY_TRACT | Status: DC
  Administered 2023-09-27 – 2023-09-28 (×3): 3 mL via RESPIRATORY_TRACT
  Filled 2023-09-27: qty 3

## 2023-09-27 NOTE — Progress Notes (Signed)
 Bbs dec'd but essn clear. No wheezes noted and pt denies home neb treatments. Change duoneb to TID per RT assessment

## 2023-09-27 NOTE — Plan of Care (Signed)
   Problem: Education: Goal: Knowledge of General Education information will improve Description Including pain rating scale, medication(s)/side effects and non-pharmacologic comfort measures Outcome: Progressing

## 2023-09-27 NOTE — Progress Notes (Signed)
 Mobility Specialist Progress Note;   09/27/23 1111  Mobility  Activity Ambulated with assistance;Pivoted/transferred from bed to chair  Level of Assistance Contact guard assist, steadying assist  Assistive Device Other (Comment) (HHA)  Distance Ambulated (ft) 200 ft  Activity Response Tolerated well  Mobility Referral Yes  Mobility visit 1 Mobility  Mobility Specialist Start Time (ACUTE ONLY) 1111  Mobility Specialist Stop Time (ACUTE ONLY) 1129  Mobility Specialist Time Calculation (min) (ACUTE ONLY) 18 min   Pt pleasant and eager for mobility. Opn 5LO2 upon arrival. Requested assistance to [facility], void successful. Required MinG assistance via HHA for ambulation. Ambulated on 6LO2, SPO2 94%. No c/o when asked. Requested to sit in chair at [facility]. Pt left in chair with all needs met, call bell in reach. RN notified.   Lauraine Erm Mobility Specialist Please contact via SecureChat or [employer] [phone]

## 2023-09-27 NOTE — Progress Notes (Signed)
 Progress Note  Patient Name: Madison Richards Date of Encounter: 09/27/2023  Primary Cardiologist:   Gordy Bergamo, MD   Subjective   She has no acute complaints.  Breathing is not at baseline.   Positive cough non productive.  Overall feels better than on admission.   Inpatient Medications    Scheduled Meds:  apixaban   2.5 mg Oral BID   ascorbic acid   500 mg Oral Daily   colestipol   1 g Oral Daily   dapagliflozin  propanediol  10 mg Oral Daily   ezetimibe   10 mg Oral Daily   feeding supplement  237 mL Oral TID BM   fluticasone   2 spray Each Nare BID   fluticasone  furoate-vilanterol  1 puff Inhalation Daily   folic acid   1 mg Oral Daily   furosemide   80 mg Oral Daily   ipratropium-albuterol   3 mL Nebulization Q6H   letrozole   2.5 mg Oral Daily   metoprolol  tartrate  25 mg Oral BID   multivitamin with minerals  1 tablet Oral Daily   pravastatin   80 mg Oral QHS   sodium chloride  flush  3 mL Intravenous Q12H   spironolactone   25 mg Oral Daily   Continuous Infusions:  PRN Meds: acetaminophen  **OR** acetaminophen , albuterol , guaiFENesin -dextromethorphan , polyethylene glycol, sodium chloride    Vital Signs    Vitals:   09/26/23 2116 09/27/23 0011 09/27/23 0417 09/27/23 0819  BP:  120/74 (!) 165/74   Pulse: 67 75 72 74  Resp: 18 17 20 18   Temp:  98.5 F (36.9 C) 97.7 F (36.5 C)   TempSrc:  Oral Oral   SpO2: 97% 97% 94% 95%  Weight:   40.8 kg   Height:        Intake/Output Summary (Last 24 hours) at 09/27/2023 0834 Last data filed at 09/27/2023 0419 Gross per 24 hour  Intake 603 ml  Output 550 ml  Net 53 ml   Filed Weights   09/25/23 0503 09/26/23 0423 09/27/23 0417  Weight: 42 kg 40.6 kg 40.8 kg    Telemetry  NSR  - Personally Reviewed  ECG    NA - Personally Reviewed  Physical Exam   GEN: No  acute distress.   Neck: No  JVD Cardiac: RRR, no murmurs, rubs, or gallops.  Respiratory:      Decreased breath sounds with basilar crackles.  GI: Soft,  nontender, non-distended, normal bowel sounds  MS:  No edema; No deformity. Neuro:   Nonfocal  Psych: Oriented and appropriate    Labs    Chemistry Recent Labs  Lab 09/21/23 1023 09/22/23 0334 09/23/23 0302 09/25/23 0318 09/26/23 0237 09/27/23 0233  NA 142 140   < > 137 136 135  K 3.4* 3.7   < > 4.2 4.2 4.1  CL 106 106   < > 96* 97* 96*  CO2 27 29   < > 33* 32 32  GLUCOSE 119* 90   < > 108* 104* 97  BUN 31* 29*   < > 32* 32* 29*  CREATININE 0.88 0.95   < > 1.04* 1.15* 1.13*  CALCIUM 9.6 8.9   < > 8.6* 8.6* 8.5*  PROT 5.4* 4.8*  --   --   --   --   ALBUMIN 2.2* 2.0*  --   --   --   --   AST 42* 38  --   --   --   --   ALT 22 19  --   --   --   --  ALKPHOS 161* 144*  --   --   --   --   BILITOT 0.9 0.7  --   --   --   --   GFRNONAA >60 58*   < > 52* 46* 47*  ANIONGAP 9 5   < > 8 7 7    < > = values in this interval not displayed.     Hematology Recent Labs  Lab 09/23/23 0302 09/24/23 0250 09/25/23 0318  WBC 4.6 4.8 5.0  RBC 2.61* 2.46* 2.67*  HGB 10.2* 9.6* 10.4*  HCT 30.2* 28.6* 31.2*  MCV 115.7* 116.3* 116.9*  MCH 39.1* 39.0* 39.0*  MCHC 33.8 33.6 33.3  RDW 17.1* 17.1* 17.2*  PLT 157 123* 178    Cardiac EnzymesNo results for input(s): TROPONINI in the last 168 hours. No results for input(s): TROPIPOC in the last 168 hours.   BNP Recent Labs  Lab 09/21/23 1023  BNP 351.5*     DDimer No results for input(s): DDIMER in the last 168 hours.   Radiology    No results found.  Cardiac Studies   Echo:  1. Left ventricular ejection fraction, by estimation, is 70 to 75%. Left  ventricular ejection fraction by 2D MOD biplane is 73.9 %. Left  ventricular ejection fraction by PLAX is 64 %. The left ventricle has  hyperdynamic function. The left ventricle has  no regional wall motion abnormalities. There is mild concentric left  ventricular hypertrophy. Left ventricular diastolic parameters are  consistent with Grade I diastolic dysfunction  (impaired relaxation). The  average left ventricular global longitudinal  strain is -24.0 %. The global longitudinal strain is normal.   2. Right ventricular systolic function is normal. The right ventricular  size is moderately enlarged. There is severely elevated pulmonary artery  systolic pressure. The estimated right ventricular systolic pressure is  62.6 mmHg.   3. Left atrial size was mildly dilated.   4. Right atrial size was moderately dilated.   5. Large pleural effusion in the left lateral region.   6. The mitral valve is degenerative. Mild mitral valve regurgitation. No  evidence of mitral stenosis. Moderate mitral annular calcification.   7. Multiple TR jets. Tricuspid valve regurgitation is severe.   8. The aortic valve is tricuspid. There is moderate calcification of the  aortic valve. Aortic valve regurgitation is not visualized. Aortic valve  sclerosis/calcification is present, without any evidence of aortic  stenosis.   9. The inferior vena cava is dilated in size with <50% respiratory  variability, suggesting right atrial pressure of 15 mmHg.   Patient Profile     89 y.o. female  with medical history significant of hypertension, history of CVA, carotid artery disease, PAD, breast cancer presenting with edema. Presented with  worsening dyspnea and lower extremity edema, to the point where she has having difficulty ambulating. She had outpatient echocardiogram and her oncologist recommended her to come to the ED.  Madison Richards  Assessment & Plan    CHF: Intake and output was net -5203  this admission.    She is on Lasix  , spiro, SGLT2,  beta blocker.  I will suggest 40 mg of Lasix  daily at discharge with close follow up of her creat.  Ambulate in hallway today.     COPD:    Will need home O2.    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  Follow up echo as an outpatient to assess pulmonary pressures.   AFib:   Maintaining sinus rhythm.  Continue DOAC  Breast Ca:    Follow on therapy for the  risk of recurrent fibrillation flutter with Verzenio .      For questions or updates, please contact [facility] Please consult [email or web address] for contact info under Cardiology/STEMI.   Signed, Lynwood Schilling, MD  09/27/2023, 8:34 AM

## 2023-09-27 NOTE — Progress Notes (Signed)
 Nurse requested Mobility Specialist to perform oxygen saturation test with pt which includes removing pt from oxygen both at rest and while ambulating.  Below are the results from that testing.     Patient Saturations on Room Air at Rest = spO2 90%  Patient Saturations on Room Air while Ambulating = sp02 87% .   Patient Saturations on 6 Liters of oxygen while Ambulating = sp02 94%  At end of testing pt left in room on 5  Liters of oxygen.  Reported results to nurse.    Lauraine Erm Mobility Specialist Please contact via SecureChat or [employer] [phone]

## 2023-09-27 NOTE — TOC Progression Note (Signed)
 Transition of Care [facility]) - Progression Note    Patient Details  Name: Madison Richards MRN: [id number] Date of Birth: Feb 12, 1935  [facility]) CM/SW Contact  Marval Gell, RN Phone Number: 09/27/2023, 1:08 PM  Clinical Narrative:     Spoke w patient at bedside. Discvussed HH and DME needs, she has no preference for provider. Referral accepted by [facility] for [facility] and [employer] for home o2.   Expected Discharge Plan: Home w Home Health Services Barriers to Discharge: Continued Medical Work up               Expected Discharge Plan and Services   Discharge Planning Services: CM Consult   Living arrangements for the past 2 months: Single Family Home                 DME Arranged: Oxygen DME Agency: [employer] Date DME Agency Contacted: 09/27/23 Time DME Agency Contacted: 1308 Representative spoke with at DME Agency: [place] HH Arranged: PT HH Agency: [facility] Date [facility] Agency Contacted: 09/27/23 Time HH Agency Contacted: 1308 Representative spoke with at [facility] Agency: [place]   Social Drivers of Health (SDOH) Interventions SDOH Screenings   Food Insecurity: No Food Insecurity (09/21/2023)  Housing: Low Risk  (09/21/2023)  Transportation Needs: No Transportation Needs (09/21/2023)  Utilities: Not At Risk (09/21/2023)  Depression (PHQ2-9): Low Risk  (03/19/2018)  Social Connections: Unknown (09/21/2023)  Tobacco Use: Medium Risk (09/21/2023)    Readmission Risk Interventions    09/22/2023    2:39 PM  Readmission Risk Prevention Plan  Transportation Screening Complete  Home Care Screening Complete  Medication Review (RN CM) Complete

## 2023-09-27 NOTE — Progress Notes (Signed)
 Progress Note   Patient: Madison Richards FMW:[id number] DOB: [birth year] DOA: 09/21/2023     5 DOS: the patient was seen and examined on 09/27/2023   Brief hospital course: Madison Richards was admitted to the hospital with the working diagnosis of heart failure exacerbation.   89 y.o. female with medical history significant of hypertension, history of CVA, carotid artery disease, PAD, breast cancer presenting with edema. Reported worsening dyspnea and lower extremity edema, to the point where she has having difficulty ambulating. She had outpatient echocardiogram and her oncologist recommended her to come to the ED.  On her initial physical examination her blood pressure was 162/50, HR 137 to 64, RR 31 and 02 saturation 95%.  Lungs with with no wheezing or rhonchi, heart with S1 and S2 present and tachycardic, irregularly irregular, positive systolic murmur at the right lower sternal border, abdomen with no distention, positive lower extremity edema.   Na 142, K 3.4 Cl 106 bicarbonate 27, glucose 119 bun 31 cr 0,88  Mg 1,7  AST 42 and ALT 22  BNP 351 Wbc 7,4 hgb 11.1 plt 152   Chest radiograph with right rotation and hyperinflation, no cardiomegaly, bilateral hilar vascular congestion with cephalization of the vasculature, small right effusion.   EKG 139 bpm, normal axis, qtc 484, atrial fibrillation rhythm with PVC, ST segment depression V5 and V6, with no significant T wave changes.   Patient was placed on furosemide  IV with improvement in volume status.  Dyspnea component likely due to COPD, placed on inhaled corticosteroids and bronchodilator therapy.   Assessment and Plan: * Acute on chronic diastolic (congestive) heart failure (HCC) Echocardiogram with preserved LV systolic function with EF 70 to 75%, mild LVH, RV systolic function is preserved. RVSP 62,2 mmHg, mild LA dilatation, moderate RA dilatation, mild MR, with severe tricuspid valve regurgitation. No aortic stenosis.   Acute on  chronic core pulmonale  Pulmonary hypertension, likely group 2 or 3    Fluid balance negative, since admission is - 5,123 ml.  Systolic blood pressure 130 mmHg.   Continue metoprolol  and spironolactone   Continue furosemide  40 mg daily and SGLT 2 inh.   Acute hypoxemic respiratory failure, due to acute cardiogenic pulmonary edema, likely on chronic hypoxemia due to COPD.  Her 02 saturation today is 93% on 7 L per [place]  Continue diuresis, and bronchodilator therapy.  Likely patient will need supplemental home 02 at the time of her discharge.   HTN (hypertension) Continue blood pressure control with metoprolol  and spironolactone .   H/O: CVA (cerebrovascular accident) Left carotid stenosis. Peripheral vascular disease.  Continue blood pressure control and statin   Protein-calorie malnutrition, severe Continue nutritional supplements.   COPD (chronic obstructive pulmonary disease) (HCC) Continue with inhaled corticosteroids and LABA  Scheduled duoneb.  Continue with airway clearing techniques with flutter valve and incentive spirometer.  Out of bed to chair tid with meals.  PT and OT.   Wean off supplemental 02 to 5 L.min to keep 02 saturation 88% or greater.  Arrange home 02   Malignant neoplasm of upper-outer quadrant of right breast in female, estrogen receptor positive (HCC) Follow up as outpatient.       Subjective: Patient with improvement in her symptoms, no chest pain, dyspnea improving, no PND or orthopnea.   Physical Exam: Vitals:   09/27/23 0417 09/27/23 0819 09/27/23 0900 09/27/23 1255  BP: (!) 165/74  129/69 (!) 140/44  Pulse: 72 74 81 76  Resp: 20 18 18 20   Temp: 97.7 F (  36.5 C)  98.7 F (37.1 C) 97.7 F (36.5 C)  TempSrc: Oral  Oral Oral  SpO2: 94% 95% 92%   Weight: 40.8 kg     Height:       Neurology awake and alert, deconditioned ENT with mild pallor Cardiovascular with S1 and S2 present and regular, positive systolic murmur at the right lower  sternal border Mild JVD Respiratory with prolonged expiratory phase and distant breath sounds, bilateral rhonchi with no wheezing.  Abdomen with no distention  Trace lower extremity edema, ted hose in place  Data Reviewed:    Family Communication: family at the bedside   Disposition: Status is: Inpatient Remains inpatient appropriate because: recovering respiratory failure  Planned Discharge Destination: Home    Author: Elidia Toribio Furnace, MD 09/27/2023 2:18 PM  For on call review [email or web address].

## 2023-09-27 NOTE — Progress Notes (Addendum)
 Progress Note   Madison Richards: Madison Richards FMW:[id number] DOB: 1934-06-20 DOA: 09/21/2023     5 DOS: the Madison Richards was seen and examined on 09/27/2023   Brief hospital course: Madison Richards was admitted to the hospital with the working diagnosis of heart failure exacerbation.   89 y.o. female with medical history significant of hypertension, history of CVA, carotid artery disease, PAD, breast cancer presenting with edema. Reported worsening dyspnea and lower extremity edema, to the point where she has having difficulty ambulating. She had outpatient echocardiogram and her oncologist recommended her to come to the ED.  On her initial physical examination her blood pressure was 162/50, HR 137 to 64, RR 31 and 02 saturation 95%.  Lungs with with no wheezing or rhonchi, heart with S1 and S2 present and tachycardic, irregularly irregular, positive systolic murmur at the right lower sternal border, abdomen with no distention, positive lower extremity edema.   Na 142, K 3.4 Cl 106 bicarbonate 27, glucose 119 bun 31 cr 0,88  Mg 1,7  AST 42 and ALT 22  BNP 351 Wbc 7,4 hgb 11.1 plt 152   Chest radiograph with right rotation and hyperinflation, no cardiomegaly, bilateral hilar vascular congestion with cephalization of the vasculature, small right effusion.   EKG 139 bpm, normal axis, qtc 484, atrial fibrillation rhythm with PVC, ST segment depression V5 and V6, with no significant T wave changes.   Madison Richards was placed on furosemide  IV with improvement in volume status.  Dyspnea component likely due to COPD, placed on inhaled corticosteroids and bronchodilator therapy.  CT chest negative for pulmonary embolism, but positive for right pleural effusion.  Consulted IR for thoracentesis   Assessment and Plan: * Acute on chronic diastolic (congestive) heart failure (HCC) Echocardiogram with preserved LV systolic function with EF 70 to 75%, mild LVH, RV systolic function is preserved. RVSP 62,2 mmHg, mild LA  dilatation, moderate RA dilatation, mild MR, with severe tricuspid valve regurgitation. No aortic stenosis.   Acute on chronic core pulmonale  Pulmonary hypertension, likely group 2 or 3    Fluid balance negative, since admission is - 6,115 ml.  Systolic blood pressure 130 mmHg.   Continue metoprolol  and spironolactone   Continue furosemide  40 mg daily and SGLT 2 inh.   Acute hypoxemic respiratory failure, due to acute cardiogenic pulmonary edema, likely on chronic hypoxemia due to COPD.  Her 02 saturation today is 92% on 5 L per [place]  Further work up with chest CT with centrilobular and paraseptal emphysema, no pulmonary embolism, positive right pleural effusion.  Plan to consult IR for right thoracentesis  Continue diuresis Madison Richards will need supplemental home 02   HTN (hypertension) Continue blood pressure control with metoprolol  and spironolactone .   H/O: CVA (cerebrovascular accident) Left carotid stenosis. Peripheral vascular disease.  Continue blood pressure control and statin   Protein-calorie malnutrition, severe Continue nutritional supplements.   COPD (chronic obstructive pulmonary disease) (HCC) Continue with inhaled corticosteroids and LABA  Scheduled duoneb.  Continue with airway clearing techniques with flutter valve and incentive spirometer.  Out of bed to chair tid with meals.  PT and OT.   Today 02 saturation is 92% on 5 L/min per [place], pulmonary embolism has been ruled out. Noted extensive emphysema on CT, centrilobular and paraseptal.  Will request thoracentesis, to improve gas exchange.  Madison Richards will need home 02   Malignant neoplasm of upper-outer quadrant of right breast in female, estrogen receptor positive (HCC) Follow up as outpatient.      Subjective: Madison Richards  with improvement of her symptoms, she continue to need supplemental 02 per [place]. No chest pain, no PND or orthopnea   Physical Exam: Vitals:   09/27/23 0011 09/27/23 0417 09/27/23 0819 09/27/23  0900  BP: 120/74 (!) 165/74  (P) 129/69  Pulse: 75 72 74 (P) 81  Resp: 17 20 18  (P) 18  Temp: 98.5 F (36.9 C) 97.7 F (36.5 C)  (P) 98.7 F (37.1 C)  TempSrc: Oral Oral  (P) Oral  SpO2: 97% 94% 95% (P) 92%  Weight:  40.8 kg    Height:       Neurology awake and alert ENT with mild pallor with no icterus Cardiovascular with S1 and S2 present and regular with no gallops, or rubs, positive systolic murmur at the right lower sternal border Respiratory with decreased breath sounds at the bases, distant breath sounds, with prolonged expiratory phase, with no wheezing, positive rhonchi with no rales.  Abdomen with no distention, soft and non tender No lower extremity edema, ted hose in place  Data Reviewed:    Family Communication: I spoke with Madison Richards's friend at the bedside, we talked in detail about Madison Richards's condition, plan of care and prognosis and all questions were addressed. (With Madison Richards's permission)    Disposition: Status is: Inpatient Remains inpatient appropriate because: thoracentesis   Planned Discharge Destination: Home     Author: Elidia Toribio Furnace, MD 09/27/2023 10:05 AM  For on call review [email or web address].

## 2023-09-28 ENCOUNTER — Inpatient Hospital Stay (HOSPITAL_COMMUNITY)

## 2023-09-28 DIAGNOSIS — I5033 Acute on chronic diastolic (congestive) heart failure: Secondary | ICD-10-CM | POA: Diagnosis not present

## 2023-09-28 LAB — BASIC METABOLIC PANEL WITH GFR
Anion gap: 8 (ref 5–15)
BUN: 21 mg/dL (ref 8–23)
CO2: 31 mmol/L (ref 22–32)
Calcium: 8.5 mg/dL — ABNORMAL LOW (ref 8.9–10.3)
Chloride: 96 mmol/L — ABNORMAL LOW (ref 98–111)
Creatinine, Ser: 1.05 mg/dL — ABNORMAL HIGH (ref 0.44–1.00)
GFR, Estimated: 52 mL/min — ABNORMAL LOW (ref >60)
Glucose, Bld: 102 mg/dL — ABNORMAL HIGH (ref 70–99)
Potassium: 4.3 mmol/L (ref 3.5–5.1)
Sodium: 135 mmol/L (ref 135–145)

## 2023-09-28 MED ORDER — IOHEXOL 350 MG/ML SOLN
60.0000 mL | Freq: Once | INTRAVENOUS | Status: AC | PRN
  Administered 2023-09-28 (×2): 60 mL via INTRAVENOUS

## 2023-09-28 NOTE — Plan of Care (Signed)
  Problem: Education: Goal: Knowledge of General Education information will improve Description: Including pain rating scale, medication(s)/side effects and non-pharmacologic comfort measures Outcome: Progressing   Problem: Clinical Measurements: Goal: Will remain free from infection Outcome: Progressing   Problem: Elimination: Goal: Will not experience complications related to bowel motility Outcome: Progressing Goal: Will not experience complications related to urinary retention Outcome: Progressing   Problem: Pain Managment: Goal: General experience of comfort will improve and/or be controlled Outcome: Progressing   Problem: Safety: Goal: Ability to remain free from injury will improve Outcome: Progressing   Problem: Health Behavior/Discharge Planning: Goal: Ability to manage health-related needs will improve Outcome: Not Progressing   Problem: Clinical Measurements: Goal: Respiratory complications will improve Outcome: Not Progressing Goal: Cardiovascular complication will be avoided Outcome: Not Progressing   Problem: Activity: Goal: Risk for activity intolerance will decrease Outcome: Not Progressing   Problem: Coping: Goal: Level of anxiety will decrease Outcome: Not Progressing   Problem: Skin Integrity: Goal: Risk for impaired skin integrity will decrease Outcome: Not Progressing

## 2023-09-28 NOTE — Care Management Important Message (Signed)
 Important Message  Patient Details  Name: Madison Richards MRN: [id number] Date of Birth: 1934-11-28   Important Message Given:  Yes - Medicare IM     [name] 09/28/2023, 10:47 AM

## 2023-09-28 NOTE — Progress Notes (Signed)
 Nutrition Follow-up  DOCUMENTATION CODES:   Severe malnutrition in context of chronic illness  INTERVENTION:  -Continue to recommend Liberalizing menu to 2g Na+ menu, regular texture, thin liquids -Continue Ensure Plus High Protein TID -Continue Magic Cup BID -Continue MVI, Vit C, Folic Acid    NUTRITION DIAGNOSIS:   Severe Malnutrition related to catabolic illness, chronic illness, decreased appetite, cancer and cancer related treatments, constipation as evidenced by severe muscle depletion, severe fat depletion, edema.  Ongoing  GOAL:   Patient will meet greater than or equal to 90% of their needs  Met  MONITOR:   PO intake, Weight trends, Supplement acceptance, Skin, Labs  REASON FOR ASSESSMENT:   Diagnosis    ASSESSMENT:   Hx hypertension, CVA, carotid artery disease, PAD, breast cancer presenting with edema and abnormal echocardiogram.  Spoke to pt and husband at bedside. Pt/husband with questions regarding current diet order as menu was changed back to HH/CM. Discussed recommendation of liberalizing her diet to 2 g Na+ only to promote desirable weight gain, decrease unnecessary dietary restrictions. Pt denies n/v/c/d or chewing/swallowing difficulties. Last BM 8/10. Discussed again increasing kcal/pro intake, fortifying foods, not restricting choices. Pt verbalized understanding. Pt does drink ONS at home, typically the [employer] protein shakes, encouraged her to also focus on increased kcal. Per pt, supposedly d/c today home after CT w/contrast of lungs to rule out PE. Pt denies additional questions/concerns at this time, will continue to monitor, RDN available prn.   Labs Cholride 96 BG 97-104 Cr 1.05 Calcium 8.5 ALK 144 Albumin 2.0 GFR 52 H/H 10.4/31.4  Medications  apixaban   2.5 mg Oral BID   ascorbic acid   500 mg Oral Daily   colestipol   1 g Oral Daily   dapagliflozin  propanediol  10 mg Oral Daily   ezetimibe   10 mg Oral Daily   feeding supplement  237  mL Oral TID BM   fluticasone   2 spray Each Nare BID   fluticasone  furoate-vilanterol  1 puff Inhalation Daily   folic acid   1 mg Oral Daily   furosemide   40 mg Oral Daily   letrozole   2.5 mg Oral Daily   metoprolol  tartrate  25 mg Oral BID   multivitamin with minerals  1 tablet Oral Daily   pravastatin   80 mg Oral QHS   sodium chloride  flush  3 mL Intravenous Q12H   spironolactone   25 mg Oral Daily     NUTRITION - FOCUSED PHYSICAL EXAM:  Flowsheet Row Most Recent Value  Orbital Region Severe depletion  Upper Arm Region Moderate depletion  Thoracic and Lumbar Region Moderate depletion  Buccal Region Severe depletion  Temple Region Severe depletion  Clavicle Bone Region Severe depletion  Clavicle and Acromion Bone Region Severe depletion  Scapular Bone Region Moderate depletion  Dorsal Hand Severe depletion  Patellar Region Moderate depletion  Anterior Thigh Region Moderate depletion  Posterior Calf Region Moderate depletion  Edema (RD Assessment) Moderate  Hair Reviewed  [texture changes with chemo]  Eyes Reviewed  Mouth Reviewed  Skin Reviewed  Nails Reviewed  [dry, brittle, cracking]    Diet Order:   Diet Order             Diet heart healthy/carb modified Fluid consistency: Thin; Fluid restriction: 1200 mL Fluid  Diet effective now                   EDUCATION NEEDS:   Education needs have been addressed  Skin:  Skin Assessment: Reviewed RN Assessment  Last BM:  8/5  Height:   Ht Readings from Last 1 Encounters:  09/21/23 5' 3 (1.6 m)    Weight:   Wt Readings from Last 1 Encounters:  09/28/23 39.9 kg    BMI:  Body mass index is 15.57 kg/m.  Estimated Nutritional Needs:   Kcal:  1350-1375 kcal  Protein:  55-70 g  Fluid:  1200 ml rest    Gerrard Crystal Daml-Budig, RDN, LDN Registered Dietitian Nutritionist RD Inpatient Contact Info in [place]

## 2023-09-28 NOTE — Progress Notes (Signed)
 Progress Note  Patient Name: Madison Richards Date of Encounter: 09/28/2023  Primary Cardiologist:   Gordy Bergamo, MD   Subjective   No chest pain.  Still not breathing at baseline.  She will go home on 4 - 5 liters O2.  Weak and still with cough but much improved.   Inpatient Medications    Scheduled Meds:  apixaban   2.5 mg Oral BID   ascorbic acid   500 mg Oral Daily   colestipol   1 g Oral Daily   dapagliflozin  propanediol  10 mg Oral Daily   ezetimibe   10 mg Oral Daily   feeding supplement  237 mL Oral TID BM   fluticasone   2 spray Each Nare BID   fluticasone  furoate-vilanterol  1 puff Inhalation Daily   folic acid   1 mg Oral Daily   furosemide   40 mg Oral Daily   ipratropium-albuterol   3 mL Nebulization TID   letrozole   2.5 mg Oral Daily   metoprolol  tartrate  25 mg Oral BID   multivitamin with minerals  1 tablet Oral Daily   pravastatin   80 mg Oral QHS   sodium chloride  flush  3 mL Intravenous Q12H   spironolactone   25 mg Oral Daily   Continuous Infusions:  PRN Meds: acetaminophen  **OR** acetaminophen , albuterol , guaiFENesin -dextromethorphan , polyethylene glycol, sodium chloride    Vital Signs    Vitals:   09/28/23 0043 09/28/23 0348 09/28/23 0738 09/28/23 0738  BP: 133/78 129/74 (!) 142/67 (!) 142/67  Pulse: 77 78 90 90  Resp: 18 18 18 18   Temp: 99 F (37.2 C) 98.5 F (36.9 C) 98.2 F (36.8 C)   TempSrc: Oral Oral Oral   SpO2: 95% 91% 95% 92%  Weight:  39.9 kg    Height:        Intake/Output Summary (Last 24 hours) at 09/28/2023 0749 Last data filed at 09/28/2023 0351 Gross per 24 hour  Intake 483 ml  Output 1000 ml  Net -517 ml   Filed Weights   09/26/23 0423 09/27/23 0417 09/28/23 0348  Weight: 40.6 kg 40.8 kg 39.9 kg    Telemetry  NSR  - Personally Reviewed  ECG    NA - Personally Reviewed  Physical Exam   GEN: No  acute distress.   Neck: No  JVD Cardiac: RRR, no murmurs, rubs, or gallops.  Respiratory:      Decreased breath sounds  with basilar crackles.  GI: Soft, nontender, non-distended, normal bowel sounds  MS:  No edema; No deformity. Neuro:   Nonfocal  Psych: Oriented and appropriate    Labs    Chemistry Recent Labs  Lab 09/21/23 1023 09/22/23 0334 09/23/23 0302 09/26/23 0237 09/27/23 0233 09/28/23 0303  NA 142 140   < > 136 135 135  K 3.4* 3.7   < > 4.2 4.1 4.3  CL 106 106   < > 97* 96* 96*  CO2 27 29   < > 32 32 31  GLUCOSE 119* 90   < > 104* 97 102*  BUN 31* 29*   < > 32* 29* 21  CREATININE 0.88 0.95   < > 1.15* 1.13* 1.05*  CALCIUM 9.6 8.9   < > 8.6* 8.5* 8.5*  PROT 5.4* 4.8*  --   --   --   --   ALBUMIN 2.2* 2.0*  --   --   --   --   AST 42* 38  --   --   --   --  ALT 22 19  --   --   --   --   ALKPHOS 161* 144*  --   --   --   --   BILITOT 0.9 0.7  --   --   --   --   GFRNONAA >60 58*   < > 46* 47* 52*  ANIONGAP 9 5   < > 7 7 8    < > = values in this interval not displayed.     Hematology Recent Labs  Lab 09/23/23 0302 09/24/23 0250 09/25/23 0318  WBC 4.6 4.8 5.0  RBC 2.61* 2.46* 2.67*  HGB 10.2* 9.6* 10.4*  HCT 30.2* 28.6* 31.2*  MCV 115.7* 116.3* 116.9*  MCH 39.1* 39.0* 39.0*  MCHC 33.8 33.6 33.3  RDW 17.1* 17.1* 17.2*  PLT 157 123* 178    Cardiac EnzymesNo results for input(s): TROPONINI in the last 168 hours. No results for input(s): TROPIPOC in the last 168 hours.   BNP Recent Labs  Lab 09/21/23 1023  BNP 351.5*     DDimer No results for input(s): DDIMER in the last 168 hours.   Radiology    No results found.  Cardiac Studies   Echo:  1. Left ventricular ejection fraction, by estimation, is 70 to 75%. Left  ventricular ejection fraction by 2D MOD biplane is 73.9 %. Left  ventricular ejection fraction by PLAX is 64 %. The left ventricle has  hyperdynamic function. The left ventricle has  no regional wall motion abnormalities. There is mild concentric left  ventricular hypertrophy. Left ventricular diastolic parameters are  consistent with  Grade I diastolic dysfunction (impaired relaxation). The  average left ventricular global longitudinal  strain is -24.0 %. The global longitudinal strain is normal.   2. Right ventricular systolic function is normal. The right ventricular  size is moderately enlarged. There is severely elevated pulmonary artery  systolic pressure. The estimated right ventricular systolic pressure is  62.6 mmHg.   3. Left atrial size was mildly dilated.   4. Right atrial size was moderately dilated.   5. Large pleural effusion in the left lateral region.   6. The mitral valve is degenerative. Mild mitral valve regurgitation. No  evidence of mitral stenosis. Moderate mitral annular calcification.   7. Multiple TR jets. Tricuspid valve regurgitation is severe.   8. The aortic valve is tricuspid. There is moderate calcification of the  aortic valve. Aortic valve regurgitation is not visualized. Aortic valve  sclerosis/calcification is present, without any evidence of aortic  stenosis.   9. The inferior vena cava is dilated in size with <50% respiratory  variability, suggesting right atrial pressure of 15 mmHg.   Patient Profile     89 y.o. female  with medical history significant of hypertension, history of CVA, carotid artery disease, PAD, breast cancer presenting with edema. Presented with  worsening dyspnea and lower extremity edema, to the point where she has having difficulty ambulating. She had outpatient echocardiogram and her oncologist recommended her to come to the ED.  [name]  Assessment & Plan    CHF: Intake and output was net -5820  this admission.    She is on Lasix  , spiro, SGLT2,  beta blocker.  I will suggest 40 mg of Lasix  daily at discharge with close follow up of her creat.    COPD:   Dr. Noralee will arrange home O2 and pulmonary follow up.     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  Follow up echo  will be ordered at her out patient appt.  AFib:   Maintaining sinus rhythm.  Continue DOAC.  She is on the  appropriate reduced dose.    Follow up :  Scheduled for our office later this month.        For questions or updates, please contact [facility] Please consult [email or web address] for contact info under Cardiology/STEMI.   Signed, Lynwood Schilling, MD  09/28/2023, 7:49 AM

## 2023-09-29 ENCOUNTER — Other Ambulatory Visit (HOSPITAL_COMMUNITY): Payer: Self-pay

## 2023-09-29 ENCOUNTER — Inpatient Hospital Stay (HOSPITAL_COMMUNITY)

## 2023-09-29 DIAGNOSIS — I5033 Acute on chronic diastolic (congestive) heart failure: Secondary | ICD-10-CM | POA: Diagnosis not present

## 2023-09-29 DIAGNOSIS — J449 Chronic obstructive pulmonary disease, unspecified: Secondary | ICD-10-CM | POA: Diagnosis not present

## 2023-09-29 DIAGNOSIS — I480 Paroxysmal atrial fibrillation: Secondary | ICD-10-CM

## 2023-09-29 DIAGNOSIS — I10 Essential (primary) hypertension: Secondary | ICD-10-CM | POA: Diagnosis not present

## 2023-09-29 HISTORY — PX: IR THORACENTESIS ASP PLEURAL SPACE W/IMG GUIDE: IMG5380

## 2023-09-29 LAB — BODY FLUID CELL COUNT WITH DIFFERENTIAL
Eos, Fluid: 1 %
Lymphs, Fluid: 71 %
Monocyte-Macrophage-Serous Fluid: 22 % — ABNORMAL LOW (ref 50–90)
Neutrophil Count, Fluid: 4 % (ref 0–25)
Other Cells, Fluid: 2 %
Total Nucleated Cell Count, Fluid: 340 uL (ref 0–1000)

## 2023-09-29 LAB — LACTATE DEHYDROGENASE, PLEURAL OR PERITONEAL FLUID: LD, Fluid: 112 U/L — ABNORMAL HIGH (ref 3–23)

## 2023-09-29 LAB — GRAM STAIN

## 2023-09-29 LAB — GLUCOSE, PLEURAL OR PERITONEAL FLUID: Glucose, Fluid: 98 mg/dL

## 2023-09-29 LAB — PROTEIN, PLEURAL OR PERITONEAL FLUID: Total protein, fluid: <3 g/dL

## 2023-09-29 MED ORDER — LIDOCAINE-EPINEPHRINE (PF) 1 %-1:200000 IJ SOLN: 10.0000 mL | Freq: Once | INTRAMUSCULAR | Status: DC

## 2023-09-29 MED ORDER — IPRATROPIUM-ALBUTEROL 0.5-2.5 (3) MG/3ML IN SOLN: 3.0000 mL | Freq: Four times a day (QID) | RESPIRATORY_TRACT | 0 refills | Status: AC | PRN

## 2023-09-29 MED ORDER — IPRATROPIUM-ALBUTEROL 0.5-2.5 (3) MG/3ML IN SOLN: 3.0000 mL | Freq: Four times a day (QID) | RESPIRATORY_TRACT | Status: DC

## 2023-09-29 MED ORDER — ENSURE PLUS HIGH PROTEIN PO LIQD
237.0000 mL | Freq: Three times a day (TID) | ORAL | 0 refills | Status: AC
  Filled 2023-09-29: qty 21330, 30d supply, fill #0

## 2023-09-29 MED ORDER — SPIRONOLACTONE 25 MG PO TABS
25.0000 mg | ORAL_TABLET | Freq: Every day | ORAL | 0 refills | Status: DC
  Filled 2023-09-29: qty 30, 30d supply, fill #0

## 2023-09-29 MED ORDER — FUROSEMIDE 40 MG PO TABS
40.0000 mg | ORAL_TABLET | Freq: Every day | ORAL | 0 refills | Status: DC
  Filled 2023-09-29: qty 30, 30d supply, fill #0

## 2023-09-29 MED ORDER — IPRATROPIUM-ALBUTEROL 0.5-2.5 (3) MG/3ML IN SOLN
3.0000 mL | Freq: Four times a day (QID) | RESPIRATORY_TRACT | 0 refills | Status: DC | PRN
  Filled 2023-09-29: qty 360, 30d supply, fill #0

## 2023-09-29 MED ORDER — APIXABAN 2.5 MG PO TABS
2.5000 mg | ORAL_TABLET | Freq: Two times a day (BID) | ORAL | 0 refills | Status: DC
  Filled 2023-09-29: qty 60, 30d supply, fill #0

## 2023-09-29 MED ORDER — DAPAGLIFLOZIN PROPANEDIOL 10 MG PO TABS
10.0000 mg | ORAL_TABLET | Freq: Every day | ORAL | 0 refills | Status: DC
Start: 2023-09-30 — End: 2023-10-15
  Filled 2023-09-29: qty 30, 30d supply, fill #0

## 2023-09-29 MED ORDER — LIDOCAINE-EPINEPHRINE 1 %-1:100000 IJ SOLN
INTRAMUSCULAR | Status: AC
  Filled 2023-09-29: qty 1

## 2023-09-29 MED ORDER — FLUTICASONE FUROATE-VILANTEROL 200-25 MCG/ACT IN AEPB
1.0000 | INHALATION_SPRAY | Freq: Every day | RESPIRATORY_TRACT | 0 refills | Status: DC
  Filled 2023-09-29: qty 60, 30d supply, fill #0

## 2023-09-29 NOTE — TOC Transition Note (Addendum)
 Transition of Care [facility]) - Discharge Note   Patient Details  Name: Madison Richards MRN: [id number] Date of Birth: [birth year]  Transition of Care [facility] CM/SW Contact:  Waddell Barnie Rama, RN Phone Number: 09/29/2023, 10:43 AM   Clinical Narrative:    For possible dc today, she has transport home, she is set up with [facility] and [employer] has supplied the home oxygen.  Patient states she needs a two wheel walker and it is rec by  PT as well.  [employer] will supply the walker for her.  Now she has decided she does not want the walker.  NCM notified [employer].      Barriers to Discharge: Continued Medical Work up   Patient Goals and CMS Choice Patient states their goals for this hospitalization and ongoing recovery are:: to go home CMS Medicare.gov Compare Post Acute Care list provided to:: Patient Choice offered to / list presented to : Patient      Discharge Placement                       Discharge Plan and Services Additional resources added to the After Visit Summary for     Discharge Planning Services: CM Consult            DME Arranged: Oxygen DME Agency: [employer] Date DME Agency Contacted: 09/27/23 Time DME Agency Contacted: 1308 Representative spoke with at DME Agency: [place] HH Arranged: PT HH Agency: [facility] Date [facility] Agency Contacted: 09/27/23 Time HH Agency Contacted: 1308 Representative spoke with at [facility] Agency: [place]  Social Drivers of Health (SDOH) Interventions SDOH Screenings   Food Insecurity: No Food Insecurity (09/21/2023)  Housing: Low Risk  (09/21/2023)  Transportation Needs: No Transportation Needs (09/21/2023)  Utilities: Not At Risk (09/21/2023)  Depression (PHQ2-9): Low Risk  (03/19/2018)  Social Connections: Unknown (09/21/2023)  Tobacco Use: Medium Risk (09/21/2023)     Readmission Risk Interventions    09/22/2023    2:39 PM  Readmission Risk Prevention Plan  Transportation Screening Complete  Home Care Screening  Complete  Medication Review (RN CM) Complete

## 2023-09-29 NOTE — Progress Notes (Signed)
 Occupational Therapy Treatment and Discharge Patient Details Name: Madison Richards MRN: [id number] DOB: [birth year] Today's Date: 09/29/2023   History of present illness Pt is a 89 y.o female admitted 8/4 for SOB. Chest x-ray showed small bil effusions w/ atelectasis. 8/12 pt s/p R thoracentesis with decreased R pleural effusion without PTX post-procedure. 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   OT comments  This 89 yo female seen at the last minute to see how she did with SPC instead of a RW or no AD, she did have one mild LOB that she did self correct by catching herself with her other hand on the room vanity. She forgot she had the O2 on and tried to walk too far and got caught by it. She asked that I not have my hands on her, to give her some room to move and so I did. She is D/C'ing today and she will benefit from continued OT at home. We will sign off.      If plan is discharge home, recommend the following:  A little help with walking and/or transfers;A little help with bathing/dressing/bathroom;Assistance with cooking/housework;Help with stairs or ramp for entrance;Assist for transportation   Equipment Recommendations  Tub/shower seat       Precautions / Restrictions Precautions Precautions: Fall Precaution/Restrictions Comments: Per RT, sat goal >92% spO2. Restrictions Weight Bearing Restrictions Per Provider Order: No       Mobility Bed Mobility  Sitting on side of bed when I entered                  Transfers Overall transfer level: Needs assistance Equipment used: Straight cane Transfers: Sit to/from Stand Sit to Stand: Supervision           General transfer comment: Ambulation with SPC in room with O2 one pt with one LOB that she did self correct but made this therapist very nervous--pt did not want me to have my hands on her. I managed her O2 tubing for her     Balance Overall balance assessment:  Needs assistance Sitting-balance support: No upper extremity supported, Feet supported Sitting balance-Leahy Scale: Good     Standing balance support: Single extremity supported Standing balance-Leahy Scale: Poor Standing balance comment: reliant on furniture walking intermittently with SPC use as well                              Extremity/Trunk Assessment Upper Extremity Assessment Upper Extremity Assessment: Overall WFL for tasks assessed            Vision Baseline Vision/History: 1 Wears glasses Patient Visual Report: No change from baseline           Communication Communication Communication: No apparent difficulties   Cognition Arousal: Alert Behavior During Therapy: WFL for tasks assessed/performed, Flat affect               OT - Cognition Comments: Decreased safety awareness when up and about                 Following commands: Intact        Cueing   Cueing Techniques: Verbal cues             Pertinent Vitals/ Pain       Pain Assessment Pain Assessment: No/denies pain         Frequency  Min 2X/week  Progress Toward Goals  OT Goals(current goals can now be found in the care plan section)  Progress towards OT goals: Progressing toward goals  Acute Rehab OT Goals Patient Stated Goal: to go home today OT Goal Formulation: With patient Time For Goal Achievement: 10/10/23 Potential to Achieve Goals: Good         AM-PAC OT 6 Clicks Daily Activity     Outcome Measure   Help from another person eating meals?: None Help from another person taking care of personal grooming?: A Little Help from another person toileting, which includes using toliet, bedpan, or urinal?: A Little Help from another person bathing (including washing, rinsing, drying)?: A Little Help from another person to put on and taking off regular upper body clothing?: A Little Help from another person to put on and taking off regular lower  body clothing?: A Little 6 Click Score: 19    End of Session Equipment Utilized During Treatment: Oxygen (5 liters on D/C)  OT Visit Diagnosis: Unsteadiness on feet (R26.81);Other abnormalities of gait and mobility (R26.89);Muscle weakness (generalized) (M62.81)   Activity Tolerance Patient tolerated treatment well   Patient Left  (in wheelchair getting ready to leace)           Time: 8595-8589 OT Time Calculation (min): 6 min  Charges: OT General Charges $OT Visit: 1 Visit  Donny BECKER OT Acute Rehabilitation Services Office [phone]   [place] Dorothyann Distel 09/29/2023, 3:57 PM

## 2023-09-29 NOTE — Assessment & Plan Note (Addendum)
 New onset arrhythmia CHADS VASc 8. Patient converted to sinus rhythm, placed on apixaban  (reduced dose for age and weight) for anticoagulation and continue metoprolol  for rate control

## 2023-09-29 NOTE — Progress Notes (Addendum)
 Physical Therapy Treatment Patient Details Name: Madison Richards MRN: [id number] DOB: [birth year] Today's Date: 09/29/2023   History of Present Illness Pt is a 89 y.o female admitted 8/4 for SOB. Chest x-ray showed small bil effusions w/ atelectasis. 8/12 pt s/p R thoracentesis with decreased R pleural effusion without PTX post-procedure. 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    PT Comments  Pt received in supine, agreeable to therapy session with encouragement for further gait safety instruction and assessment of supplemental O2 needs. Pt needing up to 4L/min O2 [place] to maintain SpO2 90% and above (noisy signal at times), BP/HR [facility] Pt with multiple episodes of significant imbalance in hallway when not using assistive device, requiring PTA intervention via gait belt (up to modA) to prevent loss of balance, with scissoring gait pattern and increased imbalance as she fatigues. Recommend staff utilize cane vs rollator next session for fall risk prevention and pt energy conservation, will continue to reinforce. OT plans to work with pt later in day and notified of pt need for gait belt for home and need to trial AD to ensure her safety. DME updated and discussed with supervising PT Michaela H. Pt continues to benefit from PT services to progress toward functional mobility goals, continue to recommend HHPT.    If plan is discharge home, recommend the following: A little help with walking and/or transfers;A little help with bathing/dressing/bathroom;Assistance with cooking/housework   Can travel by private [employer] research (medical) walker (2 wheels) (pt has rollator so may refuse RW)    Recommendations for Other Services       Precautions / Restrictions Precautions Precautions: Fall Recall of Precautions/Restrictions: Impaired Precaution/Restrictions Comments: Per RT, sat goal >92% spO2. pt defers AD use but very high fall  risk- scissoring steps Restrictions Weight Bearing Restrictions Per Provider Order: No     Mobility  Bed Mobility Overal bed mobility: Needs Assistance Bed Mobility: Supine to Sit     Supine to sit: Modified independent (Device/Increase time)     General bed mobility comments: no assist    Transfers Overall transfer level: Needs assistance Equipment used: None Transfers: Sit to/from Stand Sit to Stand: Supervision           General transfer comment: from EOB and to chair    Ambulation/Gait Ambulation/Gait assistance: Mod assist, Min assist Gait Distance (Feet): 125 Feet Assistive device: None Gait Pattern/deviations: Decreased step length - right, Decreased step length - left, Narrow base of support, Scissoring, Drifts right/left, Trunk flexed Gait velocity: variable due to need for standing breaks to check SpO2/HR monitors     General Gait Details: multiple significant losses of balance with scissoring steps, with cues for wider BOS pt slightly steadier but still unsteady and needing balance correction via gait belt vs pt grabbing furniture upon return to her room. Pt states I can catch myself but seemed unaware of extent of assist needed. Plan to trial cane next session.   Stairs             Wheelchair Mobility     Tilt Bed    Modified Rankin (Stroke Patients Only)       Balance Overall balance assessment: Needs assistance Sitting-balance support: No upper extremity supported, Feet supported Sitting balance-Leahy Scale: Good     Standing balance support: No upper extremity supported, During functional activity Standing balance-Leahy Scale: Poor Standing balance comment: supervision static standing, multiple  LOB wtihout AD for gait, needs at least single UE support                            Communication Communication Communication: No apparent difficulties  Cognition Arousal: Alert Behavior During Therapy: WFL for tasks  assessed/performed, Flat affect   PT - Cognitive impairments: Safety/Judgement                       PT - Cognition Comments: pt states I can catch my balance well but multiple episodes of PTA having to provide min/modA balance correction via gait belt due to pt scissoring steps/losses of balance not fully corrected by stepping strategy. Following commands: Intact      Cueing Cueing Techniques: Verbal cues, Gestural cues  Exercises      General Comments General comments (skin integrity, edema, etc.): BP stable supine prior to OOB and seated in chair post-exertion, no dizziness reported throughout. SpO2 85% on RA at rest, 93-96% on 4L O2 [place] at rest, 88-95% on 4L O2 [place] with exertion (amb); HR 70's bpm seated and to 80's bpm wtih exertion.      Pertinent Vitals/Pain Pain Assessment Pain Assessment: No/denies pain    Home Living                          Prior Function            PT Goals (current goals can now be found in the care plan section) Acute Rehab PT Goals Patient Stated Goal: Patient's goal is to return home. PT Goal Formulation: With patient Time For Goal Achievement: 10/10/23 Progress towards PT goals: Progressing toward goals    Frequency    Min 2X/week      PT Plan      Co-evaluation              AM-PAC PT 6 Clicks Mobility   Outcome Measure  Help needed turning from your back to your side while in a flat bed without using bedrails?: None Help needed moving from lying on your back to sitting on the side of a flat bed without using bedrails?: None Help needed moving to and from a bed to a chair (including a wheelchair)?: A Little Help needed standing up from a chair using your arms (e.g., wheelchair or bedside chair)?: A Little Help needed to walk in hospital room?: A Little Help needed climbing 3-5 steps with a railing? : A Lot 6 Click Score: 19    End of Session Equipment Utilized During Treatment: Gait  belt;Oxygen Activity Tolerance: Patient tolerated treatment well Patient left: in chair;with call bell/phone within reach;with family/visitor present;Other (comment) (brother (?) in room) Nurse Communication: Mobility status;Precautions;Other (comment) (RN notified no chair alarm is in her room, recommend one if she is alone later; amb sats) PT Visit Diagnosis: Unsteadiness on feet (R26.81);Other abnormalities of gait and mobility (R26.89);Muscle weakness (generalized) (M62.81);Difficulty in walking, not elsewhere classified (R26.2)     Time: 8892-8866 PT Time Calculation (min) (ACUTE ONLY): 26 min  Charges:    $Gait Training: 8-22 mins $Therapeutic Activity: 8-22 mins PT General Charges $$ ACUTE PT VISIT: 1 Visit                     Brion Sossamon P., PTA Acute Rehabilitation Services Secure Chat Preferred 9a-5:30pm Office: [phone]    Connell HERO [facility] 09/29/2023, 11:55 AM

## 2023-09-29 NOTE — Progress Notes (Signed)
 SATURATION QUALIFICATIONS: (This note is used to comply with regulatory documentation for home oxygen)  Patient Saturations on Room Air at Rest = 85%  Patient Saturations on Room Air while Ambulating = N/A  Patient Saturations on 4 Liters of oxygen while Ambulating = 90% and above  Please briefly explain why patient needs home oxygen: Pt hypoxic on RA at rest. Pt needed 4L/min to achieve SpO2 90% and above with rest and with activity.

## 2023-09-29 NOTE — Progress Notes (Signed)
 Progress Note  Patient Name: Madison Richards Date of Encounter: 09/29/2023  Primary Cardiologist:   Gordy Bergamo, MD   Subjective   Status post thoracentesis.  Breathing better.  Positive cough with white sputum.   Inpatient Medications    Scheduled Meds:  apixaban   2.5 mg Oral BID   ascorbic acid   500 mg Oral Daily   colestipol   1 g Oral Daily   dapagliflozin  propanediol  10 mg Oral Daily   ezetimibe   10 mg Oral Daily   feeding supplement  237 mL Oral TID BM   fluticasone   2 spray Each Nare BID   fluticasone  furoate-vilanterol  1 puff Inhalation Daily   folic acid   1 mg Oral Daily   furosemide   40 mg Oral Daily   letrozole   2.5 mg Oral Daily   lidocaine -EPINEPHrine  (PF)  10 mL Infiltration Once   metoprolol  tartrate  25 mg Oral BID   multivitamin with minerals  1 tablet Oral Daily   pravastatin   80 mg Oral QHS   sodium chloride  flush  3 mL Intravenous Q12H   spironolactone   25 mg Oral Daily   Continuous Infusions:  PRN Meds: acetaminophen  **OR** acetaminophen , albuterol , guaiFENesin -dextromethorphan , polyethylene glycol, sodium chloride    Vital Signs    Vitals:   09/29/23 0025 09/29/23 0409 09/29/23 0803 09/29/23 0813  BP: (!) 123/99 (!) 142/60  131/76  Pulse: 73 61  72  Resp: 18 16  17   Temp: 98.1 F (36.7 C) 98.1 F (36.7 C)  98.1 F (36.7 C)  TempSrc: Oral Oral  Oral  SpO2: 92% 96% 96%   Weight:  40.1 kg    Height:        Intake/Output Summary (Last 24 hours) at 09/29/2023 0952 Last data filed at 09/29/2023 0920 Gross per 24 hour  Intake 300 ml  Output 1150 ml  Net -850 ml   Filed Weights   09/27/23 0417 09/28/23 0348 09/29/23 0409  Weight: 40.8 kg 39.9 kg 40.1 kg    Telemetry  NSR  - Personally Reviewed  ECG    NA - Personally Reviewed  Physical Exam   GEN: No  acute distress.   Neck: No  JVD Cardiac: RRR, no murmurs, rubs, or gallops.  Respiratory:      Decreased breath sounds with basilar crackles.  GI: Soft, nontender,  non-distended, normal bowel sounds  MS:  No edema; No deformity. Neuro:   Nonfocal  Psych: Oriented and appropriate    Labs    Chemistry Recent Labs  Lab 09/26/23 0237 09/27/23 0233 09/28/23 0303  NA 136 135 135  K 4.2 4.1 4.3  CL 97* 96* 96*  CO2 32 32 31  GLUCOSE 104* 97 102*  BUN 32* 29* 21  CREATININE 1.15* 1.13* 1.05*  CALCIUM 8.6* 8.5* 8.5*  GFRNONAA 46* 47* 52*  ANIONGAP 7 7 8      Hematology Recent Labs  Lab 09/23/23 0302 09/24/23 0250 09/25/23 0318  WBC 4.6 4.8 5.0  RBC 2.61* 2.46* 2.67*  HGB 10.2* 9.6* 10.4*  HCT 30.2* 28.6* 31.2*  MCV 115.7* 116.3* 116.9*  MCH 39.1* 39.0* 39.0*  MCHC 33.8 33.6 33.3  RDW 17.1* 17.1* 17.2*  PLT 157 123* 178    Cardiac EnzymesNo results for input(s): TROPONINI in the last 168 hours. No results for input(s): TROPIPOC in the last 168 hours.   BNP No results for input(s): BNP, PROBNP in the last 168 hours.    DDimer No results for input(s): DDIMER in the  last 168 hours.   Radiology    CT Angio Chest Pulmonary Embolism (PE) W or WO Contrast Result Date: 09/28/2023 CLINICAL DATA:  Pulmonary embolism suspected, high probability. EXAM: CT ANGIOGRAPHY CHEST WITH CONTRAST TECHNIQUE: Multidetector CT imaging of the chest was performed using the standard protocol during bolus administration of intravenous contrast. Multiplanar CT image reconstructions and MIPs were obtained to evaluate the vascular anatomy. RADIATION DOSE REDUCTION: This exam was performed according to the departmental dose-optimization program which includes automated exposure control, adjustment of the mA and/or kV according to patient size and/or use of iterative reconstruction technique. CONTRAST:  60mL OMNIPAQUE  IOHEXOL  350 MG/ML SOLN COMPARISON:  05/22/2023 and 03/10/2012. FINDINGS: Cardiovascular: Negative for pulmonary embolus. Atherosclerotic calcification of the aorta, aortic valve and coronary arteries. Enlarged pulmonic trunk and heart.  Mediastinum/Nodes: No pathologically enlarged mediastinal, hilar or axillary lymph nodes. Esophagus is grossly unremarkable. Lungs/Pleura: Centrilobular and paraseptal emphysema. Large right and partially loculated moderate left pleural effusions. Associated atelectasis in both lower lobes. Airway is unremarkable. Upper Abdomen: Bilateral adrenal nodules, present dating back to 03/10/2012 and considered benign. Low-attenuation mass in the left kidney. No specific follow-up necessary. Visualized portions of the liver, gallbladder, adrenal glands, kidneys, spleen, pancreas, stomach and bowel are otherwise grossly unremarkable. No upper abdominal adenopathy. Musculoskeletal: Mottled sclerosis throughout the visualized osseous structures, as on 05/22/2023. Review of the MIP images confirms the above findings. IMPRESSION: 1. Negative for pulmonary embolus. 2. Large right and partially loculated moderate left pleural effusions with atelectasis in both lower lobes. 3. Diffuse osseous metastatic disease, stable and likely treated. 4. Aortic atherosclerosis (ICD10-I70.0). Coronary artery calcification. 5. Enlarged pulmonic trunk, indicative of pulmonary arterial hypertension. 6.  Emphysema (ICD10-J43.9). Electronically Signed   By: Newell Eke M.D.   On: 09/28/2023 13:40    Cardiac Studies   Echo:  1. Left ventricular ejection fraction, by estimation, is 70 to 75%. Left  ventricular ejection fraction by 2D MOD biplane is 73.9 %. Left  ventricular ejection fraction by PLAX is 64 %. The left ventricle has  hyperdynamic function. The left ventricle has  no regional wall motion abnormalities. There is mild concentric left  ventricular hypertrophy. Left ventricular diastolic parameters are  consistent with Grade I diastolic dysfunction (impaired relaxation). The  average left ventricular global longitudinal  strain is -24.0 %. The global longitudinal strain is normal.   2. Right ventricular systolic function  is normal. The right ventricular  size is moderately enlarged. There is severely elevated pulmonary artery  systolic pressure. The estimated right ventricular systolic pressure is  62.6 mmHg.   3. Left atrial size was mildly dilated.   4. Right atrial size was moderately dilated.   5. Large pleural effusion in the left lateral region.   6. The mitral valve is degenerative. Mild mitral valve regurgitation. No  evidence of mitral stenosis. Moderate mitral annular calcification.   7. Multiple TR jets. Tricuspid valve regurgitation is severe.   8. The aortic valve is tricuspid. There is moderate calcification of the  aortic valve. Aortic valve regurgitation is not visualized. Aortic valve  sclerosis/calcification is present, without any evidence of aortic  stenosis.   9. The inferior vena cava is dilated in size with <50% respiratory  variability, suggesting right atrial pressure of 15 mmHg.   Patient Profile     89 y.o. female  with medical history significant of hypertension, history of CVA, carotid artery disease, PAD, breast cancer presenting with edema. Presented with  worsening dyspnea and lower extremity edema,  to the point where she has having difficulty ambulating. She had outpatient echocardiogram and her oncologist recommended her to come to the ED.  [name]  Assessment & Plan    CHF: Intake and output was net -6790  this admission.    She is on Lasix  , spiro, SGLT2,  beta blocker.  On PO 40 mg Lasix .   COPD:   Dr. Noralee will arrange home O2 and pulmonary follow up.     CT yesterday with right greater than left effusion.  Status post thoracentesis today.    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  Follow up echo will be ordered at her out patient appt.  AFib:   Maintaining sinus rhythm.  Continue DOAC.  She is on the appropriate reduced dose.    Follow up :  Scheduled for our office later this month.        For questions or updates, please contact [facility] Please consult [email or web address]  for contact info under Cardiology/STEMI.   Signed, Lynwood Schilling, MD  09/29/2023, 9:52 AM

## 2023-09-29 NOTE — Discharge Summary (Signed)
 Physician Discharge Summary   Patient: Madison Richards MRN: [id number] DOB: [birth year]  Admit date:     09/21/2023  Discharge date: 09/29/23  Discharge Physician: Elidia Sieving Sherral Dirocco   PCP: Cleotilde Planas, MD   Recommendations at discharge:   Patient has been placed on furosemide , spironolactone  and SGLT 2 inh for heart failure regimen.  Continue metoprolol  and added apixaban  (reduced dose for age and weight) for new onset atrial fibrillation.  Placed on bronchodilator therapy and inhaled corticosteroids, continue airway clearing techniques with flutter valve and incentive spirometer.  Supplemental 02 per [place] to use at home.  Use compression socks at home  Follow up renal function and electrolytes in 7 days  Follow up with Pulmonary as outpatient Follow up with Dr Cleotilde in 7 to 10 days Follow up with Cardiology as scheduled.   Discharge Diagnoses: Principal Problem:   Acute on chronic diastolic (congestive) heart failure (HCC) Active Problems:   HTN (hypertension)   H/O: CVA (cerebrovascular accident)   Protein-calorie malnutrition, severe   COPD (chronic obstructive pulmonary disease) (HCC)   Malignant neoplasm of upper-outer quadrant of right breast in female, estrogen receptor positive (HCC)  Resolved Problems:   * No resolved hospital problems. [facility] Course: Madison Richards was admitted to the hospital with the working diagnosis of heart failure exacerbation.   89 y.o. female with medical history significant of hypertension, history of CVA, carotid artery disease, PAD, and breast cancer presenting with lower extremity edema. Reported worsening dyspnea and lower extremity edema, to the point where she has having difficulty ambulating. She had outpatient echocardiogram and her oncologist recommended her to come to the ED.  On her initial physical examination her blood pressure was 162/50, HR 137 to 64, RR 31 and 02 saturation 95%.  Lungs with with no wheezing or rhonchi, heart  with S1 and S2 present and tachycardic, irregularly irregular, positive systolic murmur at the right lower sternal border, abdomen with no distention, positive lower extremity edema.   Na 142, K 3.4 Cl 106 bicarbonate 27, glucose 119 bun 31 cr 0,88  Mg 1,7  AST 42 and ALT 22  BNP 351 Wbc 7,4 hgb 11.1 plt 152   Chest radiograph with right rotation and hyperinflation, no cardiomegaly, bilateral hilar vascular congestion with cephalization of the vasculature, small right effusion.   EKG 139 bpm, normal axis, qtc 484, atrial fibrillation rhythm with PVC, ST segment depression V5 and V6, with no significant T wave changes.   Patient was placed on furosemide  IV with improvement in volume status.  Dyspnea component likely due to COPD, placed on inhaled corticosteroids and bronchodilator therapy.  CT chest negative for pulmonary embolism, but positive for right pleural effusion.  Had right thoracentesis, 600 cc removed with good toleration.   Patient will go home with oral diuretics and supplemental 02 per  and follow up as outpatient.    Assessment and Plan: * Acute on chronic diastolic (congestive) heart failure (HCC) Echocardiogram with preserved LV systolic function with EF 70 to 75%, mild LVH, RV systolic function is preserved. RVSP 62,2 mmHg, mild LA dilatation, moderate RA dilatation, mild MR, with severe tricuspid valve regurgitation. No aortic stenosis.   Acute on chronic core pulmonale  Pulmonary hypertension, likely group 2 or 3    Patient was placed on IV furosemide  for diuresis, negative fluid balance was achieved, -6,595 ml with significant improvement in her symptoms.    Plan to continue medical therapy with metoprolol , spironolactone , SGLT 2 and furosemide   Acute hypoxemic respiratory failure, due to acute cardiogenic pulmonary edema, bilateral pleural effusions, more right than left.  Likely has also chronic hypoxemia due to COPD and pulmonary hypertension.  08/12 right  ultrasound guided thoracentesis, 600 cc fluid removed, fluid analysis transudate based on protein ratio. But noted high LDH.   At the time of her discharge 02 saturation today is 95% on 4 L per [place] Follow up chest radiograph with improvement in right pleural effusion.   HTN (hypertension) Continue blood pressure control with metoprolol  and spironolactone .   Paroxysmal atrial fibrillation (HCC) New onset arrhythmia CHADS VASc 8. Patient converted to sinus rhythm, placed on apixaban  (reduced dose for age and weight) for anticoagulation and continue metoprolol  for rate control   H/O: CVA (cerebrovascular accident) Left carotid stenosis. Peripheral vascular disease.  Continue blood pressure control and statin   COPD (chronic obstructive pulmonary disease) (HCC) Continue with inhaled corticosteroids and LABA  As needed duoneb.  Continue with airway clearing techniques with flutter valve and incentive spirometer.  Outpatient home health services and referral for pulmonary. Patient will benefit from cardiopulmonary rehabilitation.   CT chest with extensive emphysema centrilobular and paraseptal.  Smoking cessation  Supplemental 02 at home per    Protein-calorie malnutrition, severe Continue nutritional supplements.   Malignant neoplasm of upper-outer quadrant of right breast in female, estrogen receptor positive (HCC) Follow up as outpatient.       Consultants: Cardiology and IR  Procedures performed: right thoracentesis   Disposition: Home Diet recommendation:  Cardiac diet DISCHARGE MEDICATION: Allergies as of 09/29/2023       Reactions   Bacitracin-polymyxin B Other (See Comments)   AREA APPLIED TO SPREAD   Fosamax [alendronate Sodium] Other (See Comments)   Leg cramps   Simvastatin Other (See Comments)   leg pain   Clindamycin/lincomycin Rash   Rash in mouth   Neosporin [neomycin-polymyxin-gramicidin] Rash   Penicillins Rash   Rash in mouth        Medication  List     STOP taking these medications    amLODipine -valsartan  5-160 MG tablet Commonly known as: EXFORGE    hydrochlorothiazide  12.5 MG capsule Commonly known as: MICROZIDE        TAKE these medications    acetaminophen  500 MG tablet Commonly known as: TYLENOL  Take 500 mg by mouth every 6 (six) hours as needed for moderate pain (pain score 4-6). ]   apixaban  2.5 MG Tabs tablet Commonly known as: ELIQUIS  Take 1 tablet (2.5 mg total) by mouth 2 (two) times daily.   CALCIUM 600 + D PO Take 1 tablet by mouth 2 (two) times daily.   clopidogrel  75 MG tablet Commonly known as: PLAVIX  TAKE 1 TABLET BY MOUTH EVERY DAY   colestipol  1 g tablet Commonly known as: COLESTID  Take 2 g by mouth daily.   COQ10 PO Take 200 mg by mouth.   dapagliflozin  propanediol 10 MG Tabs tablet Commonly known as: FARXIGA  Take 1 tablet (10 mg total) by mouth daily. Start taking on: September 30, 2023   estradiol  0.1 MG/GM vaginal cream Commonly known as: ESTRACE  VAGINAL Place 1 Applicatorful vaginally 2 (two) times a week. What changed:  when to take this reasons to take this   ezetimibe  10 MG tablet Commonly known as: ZETIA  Take 10 mg by mouth daily.   feeding supplement Liqd Take 237 mLs by mouth 3 (three) times daily between meals.   fexofenadine 180 MG tablet Commonly known as: ALLEGRA Take 180 mg by mouth daily as needed for  allergies.   fluticasone  furoate-vilanterol 200-25 MCG/ACT Aepb Commonly known as: BREO ELLIPTA  Inhale 1 puff into the lungs daily. Start taking on: September 30, 2023   furosemide  40 MG tablet Commonly known as: LASIX  Take 1 tablet (40 mg total) by mouth daily. Start taking on: September 30, 2023   letrozole  2.5 MG tablet Commonly known as: FEMARA  TAKE 1 TABLET BY MOUTH EVERY DAY   metoprolol  tartrate 25 MG tablet Commonly known as: LOPRESSOR  TAKE 1 TABLET BY MOUTH TWICE A DAY   multivitamin with minerals Tabs tablet Take 1 tablet by mouth daily.    pravastatin  80 MG tablet Commonly known as: PRAVACHOL  Take 1 tablet (80 mg total) by mouth at bedtime.   spironolactone  25 MG tablet Commonly known as: ALDACTONE  Take 1 tablet (25 mg total) by mouth daily. Start taking on: September 30, 2023   Verzenio  50 MG tablet Generic drug: abemaciclib  Take 1 tablet (50 mg total) by mouth 2 (two) times daily.   Vitamin D3 125 MCG (5000 UT) Tabs Take 1 tablet by mouth every Monday, Wednesday, and Friday.   Xgeva  120 MG/1.7ML Soln injection Generic drug: denosumab  Inject 120 mg into the skin every 3 (three) months.               Durable Medical Equipment  (From admission, onward)           Start     Ordered   09/27/23 0916  For home use only DME oxygen  Once       Question Answer Comment  Length of Need 12 Months   Mode or (Route) Nasal cannula   Liters per Minute 5   Frequency Continuous (stationary and portable oxygen unit needed)   Oxygen conserving device Yes   Oxygen delivery system Gas      09/27/23 0916            Follow-up Information     Cleotilde Planas, MD Follow up on 10/07/2023.   Specialty: Family Medicine Why: 11 am for hospital follow up Contact information: [address] [place] [address] [phone]         [facility] Follow up.   Why: Call [phone] when you get to home to let them know to come out to set up your concentrator        [facility] Follow up.   Specialty: Home Health Services Why: They will call you in 1-2 days to set up your first home health visit Contact information: [address] [place] [address] [phone]                Discharge Exam: Filed Weights   09/27/23 0417 09/28/23 0348 09/29/23 0409  Weight: 40.8 kg 39.9 kg 40.1 kg   BP (!) 154/54 (BP Location: Right Arm)   Pulse 99   Temp 98 F (36.7 C) (Oral)   Resp 16   Ht 5' 3 (1.6 m)   Wt 40.1 kg   SpO2 96%   BMI 15.66 kg/m   Patient is feeling  better, dyspnea continue to improve and edema has resolved, no chest pain, she has been out of bed to chair  Neurology awake and alert ENT with mild pallor with no icterus Cardiovascular with S1 and S2 present, regular with no gallops, or rubs, positive systolic murmur at the right lower sternal border Respiratory with no wheezing, scattered rhonchi with no rales, decreased breath sounds at bases with prolonged expiratory phase.  Abdomen with  no distention  Trace lower extremity edema   Condition at discharge: stable  The results of significant diagnostics from this hospitalization (including imaging, microbiology, ancillary and laboratory) are listed below for reference.   Imaging Studies: IR THORACENTESIS ASP PLEURAL SPACE W/IMG GUIDE Result Date: 09/29/2023 INDICATION: Patient with a history of heart failure presents today with pleural effusions. Interventional radiology asked to perform a diagnostic and therapeutic thoracentesis. EXAM: ULTRASOUND GUIDED RIGHT THORACENTESIS MEDICATIONS: 1% lidocaine  10 mL COMPLICATIONS: None immediate. PROCEDURE: An ultrasound guided thoracentesis was thoroughly discussed with the patient and questions answered. The benefits, risks, alternatives and complications were also discussed. The patient understands and wishes to proceed with the procedure. Written consent was obtained. Ultrasound was performed to localize and mark an adequate pocket of fluid in the right chest. The area was then prepped and draped in the normal sterile fashion. 1% Lidocaine  was used for local anesthesia. Under ultrasound guidance a 6 Fr Safe-T-Centesis catheter was introduced. Thoracentesis was performed. The catheter was removed and a dressing applied. FINDINGS: A total of approximately 600 mL of yellow fluid was removed. Samples were sent to the laboratory as requested by the clinical team. IMPRESSION: Successful ultrasound guided right thoracentesis yielding 600 mL of pleural fluid.  Procedure performed by Warren Dais NP Electronically Signed   By: Juliene Balder M.D.   On: 09/29/2023 10:57   DG Chest 1 View Result Date: 09/29/2023 CLINICAL DATA:  Pleural effusions as post thoracentesis. EXAM: CHEST  1 VIEW COMPARISON:  09/21/2023 FINDINGS: Patient is rotated to the right. Lungs are hyperexpanded. No pneumothorax. Interval decrease in right pleural effusion. Small left pleural effusion with retrocardiac left base collapse/consolidation is similar to prior. The cardiopericardial silhouette is within normal limits for size. No acute bony abnormality. IMPRESSION: 1. Interval decrease in right pleural effusion without pneumothorax. 2. Similar small left pleural effusion with retrocardiac left base collapse/consolidation. Electronically Signed   By: Camellia Candle M.D.   On: 09/29/2023 09:49   CT Angio Chest Pulmonary Embolism (PE) W or WO Contrast Result Date: 09/28/2023 CLINICAL DATA:  Pulmonary embolism suspected, high probability. EXAM: CT ANGIOGRAPHY CHEST WITH CONTRAST TECHNIQUE: Multidetector CT imaging of the chest was performed using the standard protocol during bolus administration of intravenous contrast. Multiplanar CT image reconstructions and MIPs were obtained to evaluate the vascular anatomy. RADIATION DOSE REDUCTION: This exam was performed according to the departmental dose-optimization program which includes automated exposure control, adjustment of the mA and/or kV according to patient size and/or use of iterative reconstruction technique. CONTRAST:  60mL OMNIPAQUE  IOHEXOL  350 MG/ML SOLN COMPARISON:  05/22/2023 and 03/10/2012. FINDINGS: Cardiovascular: Negative for pulmonary embolus. Atherosclerotic calcification of the aorta, aortic valve and coronary arteries. Enlarged pulmonic trunk and heart. Mediastinum/Nodes: No pathologically enlarged mediastinal, hilar or axillary lymph nodes. Esophagus is grossly unremarkable. Lungs/Pleura: Centrilobular and paraseptal emphysema.  Large right and partially loculated moderate left pleural effusions. Associated atelectasis in both lower lobes. Airway is unremarkable. Upper Abdomen: Bilateral adrenal nodules, present dating back to 03/10/2012 and considered benign. Low-attenuation mass in the left kidney. No specific follow-up necessary. Visualized portions of the liver, gallbladder, adrenal glands, kidneys, spleen, pancreas, stomach and bowel are otherwise grossly unremarkable. No upper abdominal adenopathy. Musculoskeletal: Mottled sclerosis throughout the visualized osseous structures, as on 05/22/2023. Review of the MIP images confirms the above findings. IMPRESSION: 1. Negative for pulmonary embolus. 2. Large right and partially loculated moderate left pleural effusions with atelectasis in both lower lobes. 3. Diffuse osseous metastatic disease, stable and likely treated.  4. Aortic atherosclerosis (ICD10-I70.0). Coronary artery calcification. 5. Enlarged pulmonic trunk, indicative of pulmonary arterial hypertension. 6.  Emphysema (ICD10-J43.9). Electronically Signed   By: Newell Eke M.D.   On: 09/28/2023 13:40   DG Chest Portable 1 View Result Date: 09/21/2023 CLINICAL DATA:  Shortness of breath for several days. Productive cough. EXAM: PORTABLE CHEST 1 VIEW COMPARISON:  02/26/2022 FINDINGS: Grossly stable normal sized heart. Interval increased density at both lung bases with small bilateral pleural effusions. Extensive aortic calcifications. Diffuse osteopenia. Mild-to-moderate lower thoracic spine degenerative changes. IMPRESSION: 1. Interval small bilateral pleural effusions and bibasilar atelectasis or pneumonia. 2. Extensive aortic atheromatous calcifications. Electronically Signed   By: Elspeth Bathe M.D.   On: 09/21/2023 12:06   ECHOCARDIOGRAM COMPLETE Result Date: 09/15/2023    ECHOCARDIOGRAM REPORT   Patient Name:   DEMEISHA GERAGHTY Date of Exam: 09/15/2023 Medical Rec #:  [id number]      Height:       63.0 in Accession #:     [id number]     Weight:       96.1 lb Date of Birth:  Feb 27, 1934      BSA:          1.416 m Patient Age:    88 years       BP:           136/78 mmHg Patient Gender: F              HR:           66 bpm. Exam Location:  Outpatient Procedure: 2D Echo, 3D Echo, Cardiac Doppler, Color Doppler and Strain Analysis            (Both Spectral and Color Flow Doppler were utilized during            procedure). Indications:    Z51.11 Encounter for antineoplastic chemotheraphy  History:        Patient has prior history of Echocardiogram examinations, most                 recent 02/09/2018. Stroke; Risk Factors:Hypertension. Breast                 cancer.  Sonographer:    Ellouise Mose RDCS Referring Phys: [phone] MACKEY CHAD IMPRESSIONS  1. Left ventricular ejection fraction, by estimation, is 70 to 75%. Left ventricular ejection fraction by 2D MOD biplane is 73.9 %. Left ventricular ejection fraction by PLAX is 64 %. The left ventricle has hyperdynamic function. The left ventricle has no regional wall motion abnormalities. There is mild concentric left ventricular hypertrophy. Left ventricular diastolic parameters are consistent with Grade I diastolic dysfunction (impaired relaxation). The average left ventricular global longitudinal strain is -24.0 %. The global longitudinal strain is normal.  2. Right ventricular systolic function is normal. The right ventricular size is moderately enlarged. There is severely elevated pulmonary artery systolic pressure. The estimated right ventricular systolic pressure is 62.6 mmHg.  3. Left atrial size was mildly dilated.  4. Right atrial size was moderately dilated.  5. Large pleural effusion in the left lateral region.  6. The mitral valve is degenerative. Mild mitral valve regurgitation. No evidence of mitral stenosis. Moderate mitral annular calcification.  7. Multiple TR jets. Tricuspid valve regurgitation is severe.  8. The aortic valve is tricuspid. There is moderate calcification of the  aortic valve. Aortic valve regurgitation is not visualized. Aortic valve sclerosis/calcification is present, without any evidence of aortic stenosis.  9. The inferior vena  cava is dilated in size with <50% respiratory variability, suggesting right atrial pressure of 15 mmHg. FINDINGS  Left Ventricle: Left ventricular ejection fraction, by estimation, is 70 to 75%. Left ventricular ejection fraction by PLAX is 64 %. Left ventricular ejection fraction by 2D MOD biplane is 73.9 %. The left ventricle has hyperdynamic function. The left ventricle has no regional wall motion abnormalities. The average left ventricular global longitudinal strain is -24.0 %. Strain was performed and the global longitudinal strain is normal. The left ventricular internal cavity size was small. There is mild  concentric left ventricular hypertrophy. Left ventricular diastolic parameters are consistent with Grade I diastolic dysfunction (impaired relaxation). Right Ventricle: The right ventricular size is moderately enlarged. No increase in right ventricular wall thickness. Right ventricular systolic function is normal. There is severely elevated pulmonary artery systolic pressure. The tricuspid regurgitant velocity is 3.45 m/s, and with an assumed right atrial pressure of 15 mmHg, the estimated right ventricular systolic pressure is 62.6 mmHg. Left Atrium: Left atrial size was mildly dilated. Right Atrium: Right atrial size was moderately dilated. Pericardium: There is no evidence of pericardial effusion. Mitral Valve: The mitral valve is degenerative in appearance. Moderate mitral annular calcification. Mild mitral valve regurgitation. No evidence of mitral valve stenosis. MV peak gradient, 7.4 mmHg. The mean mitral valve gradient is 3.0 mmHg. Tricuspid Valve: Multiple TR jets. The tricuspid valve is grossly normal. Tricuspid valve regurgitation is severe. No evidence of tricuspid stenosis. Aortic Valve: The aortic valve is tricuspid. There  is moderate calcification of the aortic valve. Aortic valve regurgitation is not visualized. Aortic valve sclerosis/calcification is present, without any evidence of aortic stenosis. Pulmonic Valve: The pulmonic valve was normal in structure. Pulmonic valve regurgitation is mild to moderate. No evidence of pulmonic stenosis. Aorta: The aortic root and ascending aorta are structurally normal, with no evidence of dilitation. Venous: The inferior vena cava is dilated in size with less than 50% respiratory variability, suggesting right atrial pressure of 15 mmHg. IAS/Shunts: No atrial level shunt detected by color flow Doppler. Additional Comments: 3D was performed not requiring image post processing on an independent workstation and was normal. There is a large pleural effusion in the left lateral region.  LEFT VENTRICLE PLAX 2D                        Biplane EF (MOD) LV EF:         Left            LV Biplane EF:   Left                ventricular                      ventricular                ejection                         ejection                fraction by                      fraction by                PLAX is 64                       2D MOD                %.  biplane is LVIDd:         3.50 cm                          73.9 %. LVIDs:         2.30 cm LV PW:         1.10 cm         Diastology LV IVS:        1.00 cm         LV e' medial:    6.53 cm/s LVOT diam:     2.10 cm         LV E/e' medial:  15.5 LV SV:         68              LV e' lateral:   6.53 cm/s LV SV Index:   48              LV E/e' lateral: 15.5 LVOT Area:     3.46 cm                                2D Longitudinal                                Strain LV Volumes (MOD)               2D Strain GLS   -24.0 % LV vol d, MOD    36.4 ml       Avg: A2C: LV vol d, MOD    40.1 ml A4C: LV vol s, MOD    10.3 ml A2C: LV vol s, MOD    8.9 ml A4C: LV SV MOD A2C:   26.1 ml LV SV MOD A4C:   40.1 ml LV SV MOD BP:    28.3 ml RIGHT VENTRICLE              IVC RV S prime:     12.00 cm/s  IVC diam: 2.00 cm TAPSE (M-mode): 1.8 cm LEFT ATRIUM             Index        RIGHT ATRIUM           Index LA diam:        2.60 cm 1.84 cm/m   RA Area:     16.70 cm LA Vol (A2C):   28.4 ml 20.05 ml/m  RA Volume:   52.90 ml  37.36 ml/m LA Vol (A4C):   36.1 ml 25.49 ml/m LA Biplane Vol: 32.1 ml 22.67 ml/m  AORTIC VALVE             PULMONIC VALVE LVOT Vmax:   81.50 cm/s  PR End Diast Vel: 2.55 msec LVOT Vmean:  57.400 cm/s LVOT VTI:    0.197 m  AORTA Ao Root diam: 3.10 cm Ao Asc diam:  2.65 cm MITRAL VALVE                TRICUSPID VALVE MV Area (PHT): 3.12 cm     TR Peak grad:   47.6 mmHg MV Area VTI:   1.89 cm     TR Vmax:        345.00 cm/s MV Peak grad:  7.4 mmHg MV Mean grad:  3.0 mmHg     SHUNTS MV  Vmax:       1.36 m/s     Systemic VTI:  0.20 m MV Vmean:      84.3 cm/s    Systemic Diam: 2.10 cm MV Decel Time: 243 msec MV E velocity: 101.00 cm/s MV A velocity: 115.00 cm/s MV E/A ratio:  0.88 Toribio Fuel MD Electronically signed by Toribio Fuel MD Signature Date/Time: 09/15/2023/10:28:53 PM    Final     Microbiology: Results for orders placed or performed during the hospital encounter of 02/26/22  Resp panel by RT-PCR (RSV, Flu A&B, Covid) Nasopharyngeal Swab     Status: None   Collection Time: 02/26/22  7:27 AM   Specimen: Nasopharyngeal Swab; Nasal Swab  Result Value Ref Range Status   SARS Coronavirus 2 by RT PCR NEGATIVE NEGATIVE Final    Comment: (NOTE) SARS-CoV-2 target nucleic acids are NOT DETECTED.  The SARS-CoV-2 RNA is generally detectable in upper respiratory specimens during the acute phase of infection. The lowest concentration of SARS-CoV-2 viral copies this assay can detect is 138 copies/mL. A negative result does not preclude SARS-Cov-2 infection and should not be used as the sole basis for treatment or other patient management decisions. A negative result may occur with  improper specimen collection/handling, submission  of specimen other than nasopharyngeal swab, presence of viral mutation(s) within the areas targeted by this assay, and inadequate number of viral copies(<138 copies/mL). A negative result must be combined with clinical observations, patient history, and epidemiological information. The expected result is Negative.  Fact Sheet for Patients:  BloggerCourse.com  Fact Sheet for Healthcare Providers:  SeriousBroker.it  This test is no t yet approved or cleared by the [place]  FDA and  has been authorized for detection and/or diagnosis of SARS-CoV-2 by FDA under an Emergency Use Authorization (EUA). This EUA will remain  in effect (meaning this test can be used) for the duration of the COVID-19 declaration under Section 564(b)(1) of the Act, 21 U.S.C.section 360bbb-3(b)(1), unless the authorization is terminated  or revoked sooner.       Influenza A by PCR NEGATIVE NEGATIVE Final   Influenza B by PCR NEGATIVE NEGATIVE Final    Comment: (NOTE) The Xpert Xpress SARS-CoV-2/FLU/RSV plus assay is intended as an aid in the diagnosis of influenza from Nasopharyngeal swab specimens and should not be used as a sole basis for treatment. Nasal washings and aspirates are unacceptable for Xpert Xpress SARS-CoV-2/FLU/RSV testing.  Fact Sheet for Patients: BloggerCourse.com  Fact Sheet for Healthcare Providers: SeriousBroker.it  This test is not yet approved or cleared by the [place]  FDA and has been authorized for detection and/or diagnosis of SARS-CoV-2 by FDA under an Emergency Use Authorization (EUA). This EUA will remain in effect (meaning this test can be used) for the duration of the COVID-19 declaration under Section 564(b)(1) of the Act, 21 U.S.C. section 360bbb-3(b)(1), unless the authorization is terminated or revoked.     Resp Syncytial Virus by PCR NEGATIVE NEGATIVE  Final    Comment: (NOTE) Fact Sheet for Patients: BloggerCourse.com  Fact Sheet for Healthcare Providers: SeriousBroker.it  This test is not yet approved or cleared by the [place]  FDA and has been authorized for detection and/or diagnosis of SARS-CoV-2 by FDA under an Emergency Use Authorization (EUA). This EUA will remain in effect (meaning this test can be used) for the duration of the COVID-19 declaration under Section 564(b)(1) of the Act, 21 U.S.C. section 360bbb-3(b)(1), unless the authorization is terminated or revoked.  Performed at [facility]  [address] [place], [place] [address]     Labs: CBC: Recent Labs  Lab 09/23/23 0302 09/24/23 0250 09/25/23 0318  WBC 4.6 4.8 5.0  HGB 10.2* 9.6* 10.4*  HCT 30.2* 28.6* 31.2*  MCV 115.7* 116.3* 116.9*  PLT 157 123* 178   Basic Metabolic Panel: Recent Labs  Lab 09/23/23 0302 09/24/23 0250 09/25/23 0318 09/26/23 0237 09/27/23 0233 09/28/23 0303  NA 140 136 137 136 135 135  K 3.4* 4.3 4.2 4.2 4.1 4.3  CL 99 97* 96* 97* 96* 96*  CO2 31 32 33* 32 32 31  GLUCOSE 102* 114* 108* 104* 97 102*  BUN 25* 29* 32* 32* 29* 21  CREATININE 1.02* 0.95 1.04* 1.15* 1.13* 1.05*  CALCIUM 8.4* 8.1* 8.6* 8.6* 8.5* 8.5*  MG 1.4*  --  1.9 2.2  --   --    Liver Function Tests: No results for input(s): AST, ALT, ALKPHOS, BILITOT, PROT, ALBUMIN in the last 168 hours. CBG: No results for input(s): GLUCAP in the last 168 hours.  Discharge time spent: greater than 30 minutes.  Signed: Elidia Toribio Furnace, MD [facility] 09/29/2023

## 2023-09-29 NOTE — Plan of Care (Signed)
  Problem: Education: Goal: Knowledge of General Education information will improve Description: Including pain rating scale, medication(s)/side effects and non-pharmacologic comfort measures Outcome: Progressing   Problem: Health Behavior/Discharge Planning: Goal: Ability to manage health-related needs will improve Outcome: Progressing   Problem: Clinical Measurements: Goal: Ability to maintain clinical measurements within normal limits will improve Outcome: Progressing Goal: Will remain free from infection Outcome: Progressing Goal: Respiratory complications will improve Outcome: Progressing Goal: Cardiovascular complication will be avoided Outcome: Progressing   Problem: Activity: Goal: Risk for activity intolerance will decrease Outcome: Progressing   Problem: Nutrition: Goal: Adequate nutrition will be maintained Outcome: Progressing   Problem: Elimination: Goal: Will not experience complications related to bowel motility Outcome: Progressing Goal: Will not experience complications related to urinary retention Outcome: Progressing   Problem: Safety: Goal: Ability to remain free from injury will improve Outcome: Progressing   Problem: Skin Integrity: Goal: Risk for impaired skin integrity will decrease Outcome: Progressing   Problem: Coping: Goal: Level of anxiety will decrease Outcome: Not Progressing   Problem: Pain Managment: Goal: General experience of comfort will improve and/or be controlled Outcome: Not Progressing

## 2023-09-29 NOTE — Procedures (Signed)
 PROCEDURE SUMMARY:  Successful US  guided right thoracentesis. Yielded 600 ml of yellow fluid. Pt tolerated procedure well. No immediate complications.  Specimen sent for labs. CXR ordered; no post-procedure pneumothorax identified.   EBL < 2 mL  Warren JONELLE Dais, NP 09/29/2023 10:25 AM

## 2023-09-30 ENCOUNTER — Other Ambulatory Visit: Payer: Self-pay

## 2023-09-30 DIAGNOSIS — I739 Peripheral vascular disease, unspecified: Secondary | ICD-10-CM | POA: Diagnosis not present

## 2023-09-30 DIAGNOSIS — I110 Hypertensive heart disease with heart failure: Secondary | ICD-10-CM | POA: Diagnosis not present

## 2023-09-30 DIAGNOSIS — I480 Paroxysmal atrial fibrillation: Secondary | ICD-10-CM | POA: Diagnosis not present

## 2023-09-30 DIAGNOSIS — J432 Centrilobular emphysema: Secondary | ICD-10-CM | POA: Diagnosis not present

## 2023-09-30 DIAGNOSIS — Z170 Estrogen receptor positive status [ER+]: Secondary | ICD-10-CM | POA: Diagnosis not present

## 2023-09-30 DIAGNOSIS — J9601 Acute respiratory failure with hypoxia: Secondary | ICD-10-CM | POA: Diagnosis not present

## 2023-09-30 DIAGNOSIS — I2510 Atherosclerotic heart disease of native coronary artery without angina pectoris: Secondary | ICD-10-CM | POA: Diagnosis not present

## 2023-09-30 DIAGNOSIS — I5033 Acute on chronic diastolic (congestive) heart failure: Secondary | ICD-10-CM | POA: Diagnosis not present

## 2023-09-30 DIAGNOSIS — C50411 Malignant neoplasm of upper-outer quadrant of right female breast: Secondary | ICD-10-CM | POA: Diagnosis not present

## 2023-09-30 LAB — PATHOLOGIST SMEAR REVIEW

## 2023-10-01 ENCOUNTER — Telehealth: Payer: Self-pay | Admitting: Pharmacist

## 2023-10-01 ENCOUNTER — Telehealth: Payer: Self-pay | Admitting: Cardiology

## 2023-10-01 NOTE — Telephone Encounter (Signed)
 [facility]       Telephone: [phone]?Fax: [phone]   Oncology Clinical Pharmacist Practitioner Progress Note  Madison Richards is a 89 y.o. female with a diagnosis of metastatic breast cancer currently on abemaciclib  + letrozole  + denosumab  120 mg under the care of Dr. Mackey Chad.   I connected with [name] today by telephone and verified that I was speaking with the correct person using two patient identifiers. I discussed the limitations, risks, security and privacy concerns of performing an evaluation and management service by telemedicine and the availability of in-person appointments. The patient/caregiver expressed understanding and agreed to proceed.  Other persons participating in the visit and their role in the encounter: none   Patient's location: home  Provider's location: clinic  Ms. Lorence contacted Dr. Gara office yesterday.  Her voicemail was returned by clinical pharmacy today.  She was inquiring about when she could restart abemaciclib  and letrozole  since he had a recent hospital admission.  We confirmed that she did not take abemaciclib  or letrozole  while in the hospital.  Reviewed with Dr. Gudena, and he would like her to start abemaciclib  and letrozole  again today.  Communicated that with Ms. Porcelli and she will start both medications today.  Dr. Gudena would also like her seen by clinical pharmacy in 4 weeks with labs.  We reviewed this with Ms. Wardrip and she is in agreement.  Sent scheduling a message regarding these 2 appointments.  She has restaging scans ordered but not scheduled yet for early October.  She will then see Dr. Chad with labs on 11/26/2023 and review the scans at this time. She will also receive denosumab  120 mg at this time.  [name] participated in the discussion, expressed understanding, and voiced agreement with the above plan. All questions were answered to her satisfaction. The patient was advised to contact  the clinic at [phone] with any questions or concerns prior to her return visit.  Clinical pharmacy will continue to support [name] and Dr. Vinay Gudena as needed.  Dyna Figuereo A. Lucila, PharmD, BCOP, CPP  Norleen DELENA Lucila, RPH-CPP,  10/01/2023  9:54 AM   **Disclaimer: This note was dictated with voice recognition software. Similar sounding words can inadvertently be transcribed and this note may contain transcription errors which may not have been corrected upon publication of note.**

## 2023-10-01 NOTE — Telephone Encounter (Signed)
 Susann with [facility] physicians called in asking if its okay for pt to stop Plavix  because pt was recently put on Eliquis . Please advise.

## 2023-10-01 NOTE — Telephone Encounter (Signed)
 Call placed to Madison Richards. No answer.  I spoke with patient who reports she had spoken with [facility] and was told [name] would check with Dr Ladona about stopping Plavix . I told patient Dr Ladona would like her to stop Plavix  and continue Eliquis .  Patient has appointment at [facility] next week.  Call placed to [facility]. Message left for Madison Richards to call office

## 2023-10-01 NOTE — Telephone Encounter (Signed)
 Patient was admitted 09/21/23 CHF and developed afib.    Paroxysmal atrial fibrillation (HCC) New onset arrhythmia CHADS VASc 8. Patient converted to sinus rhythm, placed on apixaban  (reduced dose for age and weight) for anticoagulation and continue metoprolol  for rate control  _____________________________________________________   Madison Richards office calling to inform cardiology.  Follows with Dr. Ganji for PAD and is prescribed Plavix .  Will route to Dr. Ladona for recommendation.

## 2023-10-01 NOTE — Telephone Encounter (Signed)
 Stop Plavix  and continue Eliquis  only

## 2023-10-02 ENCOUNTER — Other Ambulatory Visit: Payer: Self-pay

## 2023-10-02 DIAGNOSIS — I2510 Atherosclerotic heart disease of native coronary artery without angina pectoris: Secondary | ICD-10-CM | POA: Diagnosis not present

## 2023-10-02 DIAGNOSIS — I480 Paroxysmal atrial fibrillation: Secondary | ICD-10-CM | POA: Diagnosis not present

## 2023-10-02 DIAGNOSIS — I739 Peripheral vascular disease, unspecified: Secondary | ICD-10-CM | POA: Diagnosis not present

## 2023-10-02 DIAGNOSIS — J9601 Acute respiratory failure with hypoxia: Secondary | ICD-10-CM | POA: Diagnosis not present

## 2023-10-02 DIAGNOSIS — J432 Centrilobular emphysema: Secondary | ICD-10-CM | POA: Diagnosis not present

## 2023-10-02 DIAGNOSIS — I5033 Acute on chronic diastolic (congestive) heart failure: Secondary | ICD-10-CM | POA: Diagnosis not present

## 2023-10-02 DIAGNOSIS — I110 Hypertensive heart disease with heart failure: Secondary | ICD-10-CM | POA: Diagnosis not present

## 2023-10-02 DIAGNOSIS — Z170 Estrogen receptor positive status [ER+]: Secondary | ICD-10-CM | POA: Diagnosis not present

## 2023-10-02 DIAGNOSIS — C50411 Malignant neoplasm of upper-outer quadrant of right female breast: Secondary | ICD-10-CM | POA: Diagnosis not present

## 2023-10-02 NOTE — Progress Notes (Signed)
 Clinical Intervention Note  Clinical Intervention Notes: Patient reports initiating Breo, Eliquis , Farxiga , Furosemide , and Spironolactone . No DDIs identified with Verzenio .   Clinical Intervention Outcomes: Prevention of an adverse drug event   Madison Richards [facility] Specialty Pharmacist

## 2023-10-02 NOTE — Telephone Encounter (Signed)
 Madison  Richards called back and I let her know that Dr Ladona said to stop Plavix  but continue Eliquis .

## 2023-10-02 NOTE — Progress Notes (Signed)
 Specialty Pharmacy Refill Coordination Note  Madison Richards is a 89 y.o. female contacted today regarding refills of specialty medication(s) Abemaciclib  (VERZENIO )   Patient requested [name] at [facility] [place] date: 10/09/23   Medication will be filled on 10/08/23.

## 2023-10-04 LAB — CULTURE, BODY FLUID W GRAM STAIN -BOTTLE: Culture: NO GROWTH

## 2023-10-05 DIAGNOSIS — C50411 Malignant neoplasm of upper-outer quadrant of right female breast: Secondary | ICD-10-CM | POA: Diagnosis not present

## 2023-10-05 DIAGNOSIS — I110 Hypertensive heart disease with heart failure: Secondary | ICD-10-CM | POA: Diagnosis not present

## 2023-10-05 DIAGNOSIS — I2510 Atherosclerotic heart disease of native coronary artery without angina pectoris: Secondary | ICD-10-CM | POA: Diagnosis not present

## 2023-10-05 DIAGNOSIS — I480 Paroxysmal atrial fibrillation: Secondary | ICD-10-CM | POA: Diagnosis not present

## 2023-10-05 DIAGNOSIS — I5033 Acute on chronic diastolic (congestive) heart failure: Secondary | ICD-10-CM | POA: Diagnosis not present

## 2023-10-05 DIAGNOSIS — J9601 Acute respiratory failure with hypoxia: Secondary | ICD-10-CM | POA: Diagnosis not present

## 2023-10-05 DIAGNOSIS — Z170 Estrogen receptor positive status [ER+]: Secondary | ICD-10-CM | POA: Diagnosis not present

## 2023-10-05 DIAGNOSIS — J432 Centrilobular emphysema: Secondary | ICD-10-CM | POA: Diagnosis not present

## 2023-10-05 DIAGNOSIS — I739 Peripheral vascular disease, unspecified: Secondary | ICD-10-CM | POA: Diagnosis not present

## 2023-10-06 DIAGNOSIS — I110 Hypertensive heart disease with heart failure: Secondary | ICD-10-CM | POA: Diagnosis not present

## 2023-10-06 DIAGNOSIS — I5033 Acute on chronic diastolic (congestive) heart failure: Secondary | ICD-10-CM | POA: Diagnosis not present

## 2023-10-06 DIAGNOSIS — I480 Paroxysmal atrial fibrillation: Secondary | ICD-10-CM | POA: Diagnosis not present

## 2023-10-06 DIAGNOSIS — I2510 Atherosclerotic heart disease of native coronary artery without angina pectoris: Secondary | ICD-10-CM | POA: Diagnosis not present

## 2023-10-06 DIAGNOSIS — I739 Peripheral vascular disease, unspecified: Secondary | ICD-10-CM | POA: Diagnosis not present

## 2023-10-06 DIAGNOSIS — Z170 Estrogen receptor positive status [ER+]: Secondary | ICD-10-CM | POA: Diagnosis not present

## 2023-10-06 DIAGNOSIS — J9601 Acute respiratory failure with hypoxia: Secondary | ICD-10-CM | POA: Diagnosis not present

## 2023-10-06 DIAGNOSIS — C50411 Malignant neoplasm of upper-outer quadrant of right female breast: Secondary | ICD-10-CM | POA: Diagnosis not present

## 2023-10-06 DIAGNOSIS — J432 Centrilobular emphysema: Secondary | ICD-10-CM | POA: Diagnosis not present

## 2023-10-07 DIAGNOSIS — J449 Chronic obstructive pulmonary disease, unspecified: Secondary | ICD-10-CM | POA: Diagnosis not present

## 2023-10-07 DIAGNOSIS — I5033 Acute on chronic diastolic (congestive) heart failure: Secondary | ICD-10-CM | POA: Diagnosis not present

## 2023-10-07 DIAGNOSIS — C50411 Malignant neoplasm of upper-outer quadrant of right female breast: Secondary | ICD-10-CM | POA: Diagnosis not present

## 2023-10-07 DIAGNOSIS — I4891 Unspecified atrial fibrillation: Secondary | ICD-10-CM | POA: Diagnosis not present

## 2023-10-07 DIAGNOSIS — Z681 Body mass index (BMI) 19 or less, adult: Secondary | ICD-10-CM | POA: Diagnosis not present

## 2023-10-07 DIAGNOSIS — E43 Unspecified severe protein-calorie malnutrition: Secondary | ICD-10-CM | POA: Diagnosis not present

## 2023-10-08 DIAGNOSIS — I739 Peripheral vascular disease, unspecified: Secondary | ICD-10-CM | POA: Diagnosis not present

## 2023-10-08 DIAGNOSIS — I480 Paroxysmal atrial fibrillation: Secondary | ICD-10-CM | POA: Diagnosis not present

## 2023-10-08 DIAGNOSIS — C50411 Malignant neoplasm of upper-outer quadrant of right female breast: Secondary | ICD-10-CM | POA: Diagnosis not present

## 2023-10-08 DIAGNOSIS — I5033 Acute on chronic diastolic (congestive) heart failure: Secondary | ICD-10-CM | POA: Diagnosis not present

## 2023-10-08 DIAGNOSIS — J9601 Acute respiratory failure with hypoxia: Secondary | ICD-10-CM | POA: Diagnosis not present

## 2023-10-08 DIAGNOSIS — I110 Hypertensive heart disease with heart failure: Secondary | ICD-10-CM | POA: Diagnosis not present

## 2023-10-08 DIAGNOSIS — J432 Centrilobular emphysema: Secondary | ICD-10-CM | POA: Diagnosis not present

## 2023-10-08 DIAGNOSIS — Z170 Estrogen receptor positive status [ER+]: Secondary | ICD-10-CM | POA: Diagnosis not present

## 2023-10-08 DIAGNOSIS — I2510 Atherosclerotic heart disease of native coronary artery without angina pectoris: Secondary | ICD-10-CM | POA: Diagnosis not present

## 2023-10-09 ENCOUNTER — Other Ambulatory Visit (HOSPITAL_COMMUNITY): Payer: Self-pay

## 2023-10-12 DIAGNOSIS — J9601 Acute respiratory failure with hypoxia: Secondary | ICD-10-CM | POA: Diagnosis not present

## 2023-10-12 DIAGNOSIS — I5033 Acute on chronic diastolic (congestive) heart failure: Secondary | ICD-10-CM | POA: Diagnosis not present

## 2023-10-12 DIAGNOSIS — I2510 Atherosclerotic heart disease of native coronary artery without angina pectoris: Secondary | ICD-10-CM | POA: Diagnosis not present

## 2023-10-12 DIAGNOSIS — I110 Hypertensive heart disease with heart failure: Secondary | ICD-10-CM | POA: Diagnosis not present

## 2023-10-12 DIAGNOSIS — I739 Peripheral vascular disease, unspecified: Secondary | ICD-10-CM | POA: Diagnosis not present

## 2023-10-12 DIAGNOSIS — Z170 Estrogen receptor positive status [ER+]: Secondary | ICD-10-CM | POA: Diagnosis not present

## 2023-10-12 DIAGNOSIS — J432 Centrilobular emphysema: Secondary | ICD-10-CM | POA: Diagnosis not present

## 2023-10-12 DIAGNOSIS — I480 Paroxysmal atrial fibrillation: Secondary | ICD-10-CM | POA: Diagnosis not present

## 2023-10-12 DIAGNOSIS — C50411 Malignant neoplasm of upper-outer quadrant of right female breast: Secondary | ICD-10-CM | POA: Diagnosis not present

## 2023-10-13 IMAGING — MG MM DIGITAL SCREENING BILAT W/ TOMO AND CAD
6 of 10 series · 6 of 30 positions shown · non-contrast
Comparison: Previous exam(s).

CLINICAL DATA: Screening.

EXAM:
DIGITAL SCREENING BILATERAL MAMMOGRAM WITH TOMOSYNTHESIS AND CAD
TECHNIQUE: Bilateral screening digital craniocaudal and mediolateral oblique
mammograms were obtained. Bilateral screening digital breast
tomosynthesis was performed. The images were evaluated with
computer-aided detection.

[R CC synth-2D]
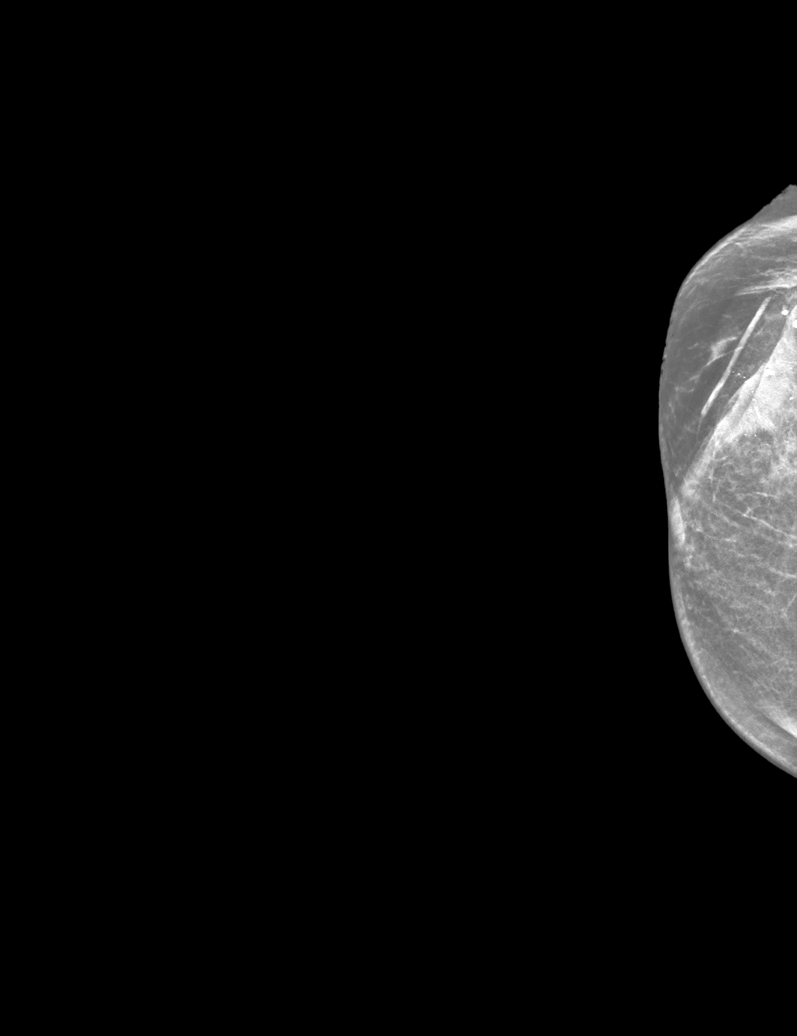

[R MLO synth-2D (1 of 2)]
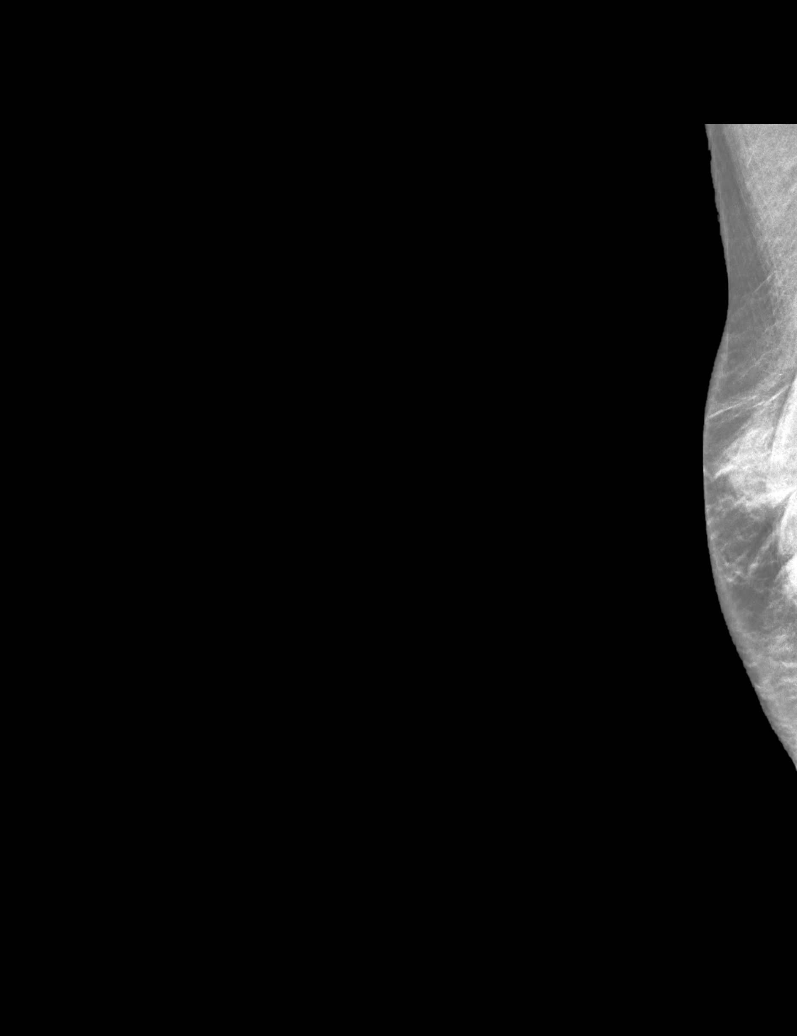

[L MLO synth-2D]
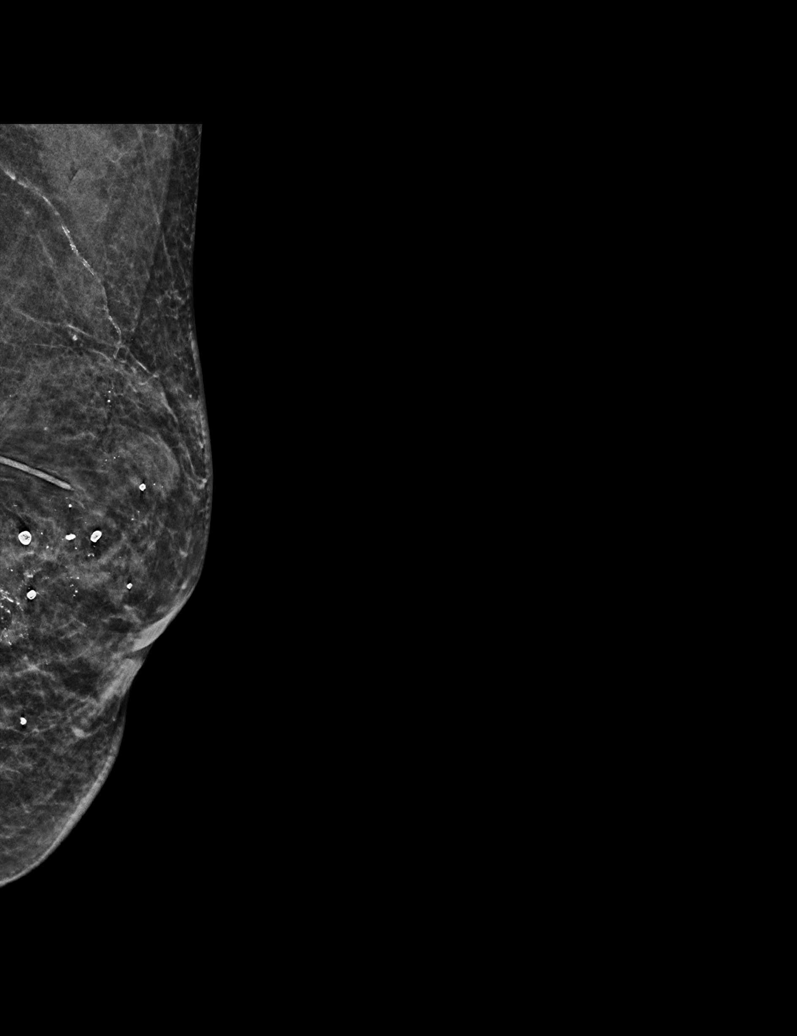

[L CC synth-2D]
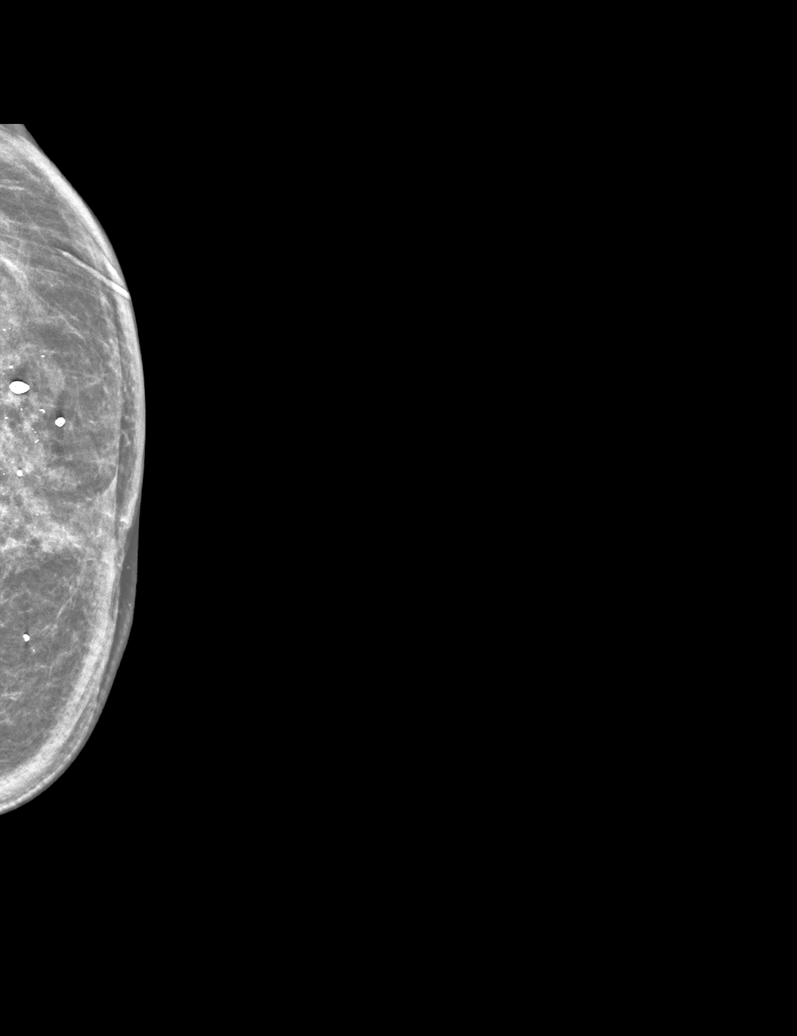

[R MLO synth-2D (2 of 2)]
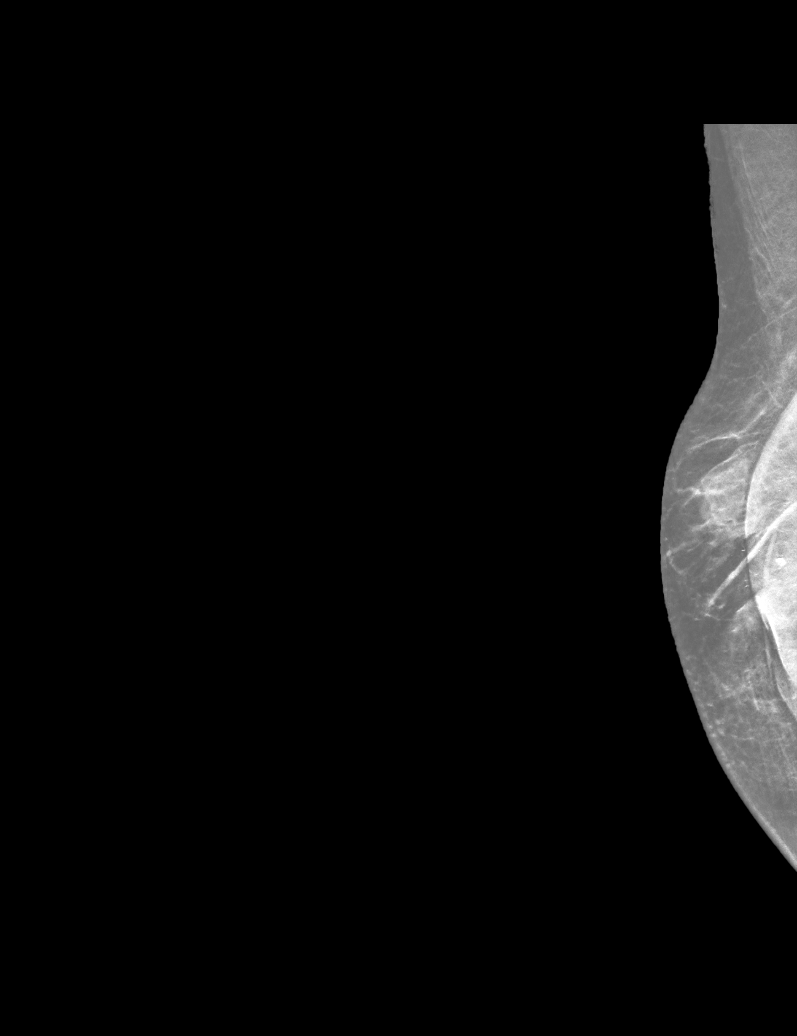

[R CC tomo · tomo slice 20/39.0]
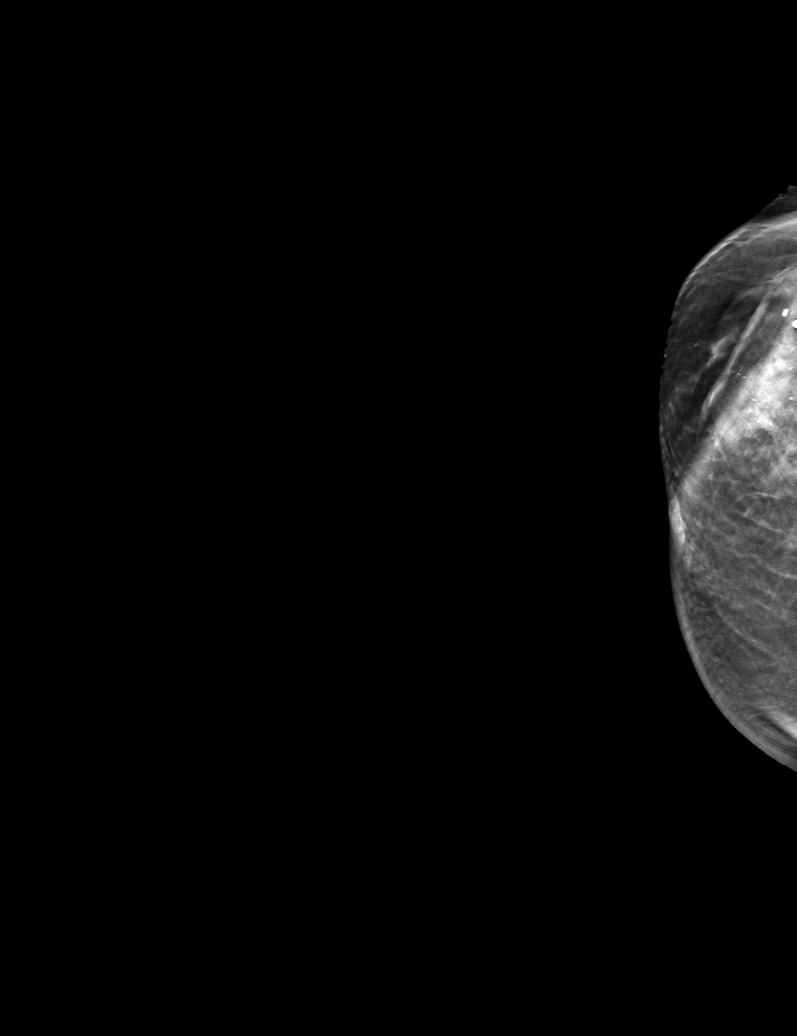

[6 of 30 positions shown; findings below may reference images not displayed]

ACR Breast Density Category c: The breast tissue is heterogeneously
dense, which may obscure small masses.
FINDINGS: There are no findings suspicious for malignancy.
IMPRESSION: No mammographic evidence of malignancy. A result letter of this
screening mammogram will be mailed directly to the patient.

RECOMMENDATION:
Screening mammogram in one year. (Code:Q3-W-BC3)

BI-RADS CATEGORY  1: Negative.

## 2023-10-14 NOTE — Progress Notes (Unsigned)
 Cardiology Office Note:  .   Date:  10/15/2023  ID:  Madison Richards, DOB Aug 18, 1934, MRN [id number] PCP: Cleotilde Planas, MD  [facility] Providers Cardiologist:  Gordy Bergamo, MD {  History of Present Illness: .   Madison Richards is a 89 y.o. 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   Last seen in heartcare OV 03/24/2023 with Dr. Bergamo for annual visit.  Reported stable claudications symptoms, stable SOB, and occasional LE. Otherwise doing well from cardiac standpoint and denies any other cardiac symptoms. Noted elevated LDL of 106 with LDL <100. Pravastatin  increased from 40 to 80 mg daily. No other med changes.   Recent hospitalization 8/4/-01/2024 with concern for worsening dyspnea and LE edema admitted for acute HF exacerbation with BNP of 351. CXR C/W HF.  Echo showed  preserved LV systolic function with EF 70 to 75%, mild LVH, RV systolic function is preserved, RVSP 62,2 mmHg, mild LA dilatation, moderate RA dilatation, mild MR, with severe tricuspid valve regurgitation. Significant improvement with  IV furosemide  diureses with output of-6,595 ml and discharge weight of 88 lbs (8/4/ 99 lbs). Found to be in new onset atrial fibrillation with RVR, HR 139, ST depression in V5 and V6 with no significant T wave changes. Self converted back to NSR.  Course also complicated by acute hypoxic respiratory failure most likely secondary to acute cardiogenic  pulmonary edema and bilateral pleural effusion (R>L).  CT negative for PE but positive for right pleural effusion.  Follow-up CXR noted improvement in right pleural effusion.  Also noted acute chronic hypoxemia due to COPD and pulmonary hypertension.  Discontinued amlodipine /valsartan  and HCTZ.  Discharged on Eliquis  2.5 mg twice daily, Plavix  75 mg daily, Farxiga  10 mg daily, Zetia  10 mg daily, Lasix  40 mg daily, Lopressor  25 mg twice daily, pravastatin  80 mg daily, and spironolactone  25 mg daily  Dr. Bergamo agreed with PCP 10/01/2023 to discontinue Plavix  (for PAD) and continue with Eliquis  in setting of recent paroxysmal afib.  Today, reports significant improvement and resolution of SOB and LE edema since hospitalization.  Reports ongoing orthopnea X 1 month and sleeps with 2 pillows.  Patient on oxygen since leaving hospital.  Followed up with PCP who reduced O2 from 5L-4L without any concerns.  Patient is inquiring about either removing or reducing O2. At home patient has removed O2 and notes 1% drop in oxygen saturation.  Denies chest pain, palpitations, dizziness, syncope, acute bleeding, or claudication.  Reviewed home vital log including HR mainly 70 to 90s and weight stable 82 to 85 pounds. Log shows 1 episode of HR in 130's for 1 hour then 90's for 1 hour then maintains in 80's. Reports compliance with medications.  Endorses low-sodium heart healthy diet.  Activity is limited due to O2 tank but she is able to walk from room to room without any concerns. Reports 1 to 2 glasses of white wine daily with dinner.  Denies tobacco use/drug use.   ROS: 10 point review of system has been  reviewed and considered negative except ones been listed in the HPI.   Studies Reviewed: [name]   EKG Interpretation Date/Time:  Thursday October 15 2023 11:34:15 EDT Ventricular Rate:  51 PR Interval:  214 QRS Duration:  64 QT Interval:  454 QTC Calculation: 418 R Axis:   -76  Text Interpretation: Sinus bradycardia  with 1st degree A-V block Left axis deviation chronic ST depression  in v4-6 When compared with ECG of 21-Sep-2023 09:55, PREVIOUS ECG IS PRESENT Confirmed by Sheron Hallmark ([id number]) on 10/15/2023 12:06:59 PM   ECHO 08/2023 IMPRESSIONS   1. Left ventricular ejection fraction, by estimation, is 70 to 75%. Left ventricular ejection fraction by 2D MOD biplane is 73.9 %. Left  ventricular ejection fraction by PLAX is 64 %. The left ventricle has  hyperdynamic function. The left ventricle has no regional wall motion abnormalities. There is mild concentric left ventricular hypertrophy. Left ventricular diastolic parameters are consistent with Grade I diastolic dysfunction (impaired relaxation). The average left ventricular global longitudinal strain is -24.0 %. The global longitudinal strain is normal.   2. Right ventricular systolic function is normal. The right ventricular size is moderately enlarged. There is severely elevated pulmonary artery systolic pressure. The estimated right ventricular systolic pressure is 62.6 mmHg.   3. Left atrial size was mildly dilated.   4. Right atrial size was moderately dilated.   5. Large pleural effusion in the left lateral region.   6. The mitral valve is degenerative. Mild mitral valve regurgitation. No evidence of mitral stenosis. Moderate mitral annular calcification.   7. Multiple TR jets. Tricuspid valve regurgitation is severe.   8. The aortic valve is tricuspid. There is moderate calcification of the aortic valve. Aortic valve regurgitation is not visualized. Aortic valve sclerosis/calcification is present, without any evidence of aortic stenosis.   9. The inferior vena cava is dilated in size with <50% respiratory  variability, suggesting right atrial pressure of 15 mmHg.   Risk Assessment/Calculations:    CHA2DS2-VASc Score = 9   This indicates a 12.2% annual risk of stroke. The patient's score is based upon: CHF History: 1 HTN History: 1 Diabetes  History: 1 Stroke History: 2 Vascular Disease History: 1 Age Score: 2 Gender Score: 1  Physical Exam:   VS:  BP (!) 142/88 (BP Location: Right Arm, Cuff Size: Small)   Pulse 67   Ht 5' 3 (1.6 m)   Wt 85 lb (38.6 kg)   SpO2 97%   BMI 15.06 kg/m    Wt Readings from Last 3 Encounters:  10/15/23 85 lb (38.6 kg)  09/29/23 88 lb 6.5 oz (40.1 kg)  08/20/23 96 lb 1.6 oz (43.6 kg)    GEN: Well nourished, well developed in no acute distress while sitting in chair. Wearing 4L [place]. Accompanied by husband.  NECK: No JVD; No carotid bruits CARDIAC: RRR, no murmurs, rubs, gallops RESPIRATORY:  Clear to auscultation without rales, wheezing or rhonchi  ABDOMEN: Soft, non-tender, non-distended EXTREMITIES:  No edema; No deformity   ASSESSMENT AND PLAN: .   Chronic heart failure with preserved ejection fraction (HCC) ECHO 09/15/2023: EF 70 to 75%, mild LVH Reports significant improvement and resolution of SOB and LE edema.  Notes ongoing orthopnea. Appears Euvolemic on exam.  Reviewed home vital log including weight stable 82 to 85 pounds. (Discharge weight 88 lbs)  Cr 1.05 in 09/2023 & K 4.3 in 09/2023 Continue on Farxiga  10 mg daily, Lasix  40 mg, Lopressor  25 mg Bid, Spirolactone 25 daily Encouraged low  sodium diet, fluid restriction <2L, and daily weights.  Educated to contact our office for weight gain of 2 lbs overnight or 5 lbs in one week. ED precautions discussed.    PAF (paroxysmal atrial fibrillation) (HCC) ECHO 09/15/2023: mildly dilated LA, moderately dilated RA EKG today shows sinus bradycardia, HR 51 with 1st AV block and chronic ST depression in V4-6. Reviewed home vital log including HR mainly 70 to 90s. Log shows 1 episode of HR in 130's for 1 hour then 90's for 1 hour then maintains in 80's. This could have been brief episode of afib but patient denies palpitations or active bleeding, however patient did not feel afib in hospital.  On [facility] with CBC stable on 09/2023. Continue on   Lopressor  25 mg BID and Eliquis  2.5 mg BID (dose appropriate for age 56, weight 38.6 kg, Cr 1.05  in 09/2023)   Primary hypertension Reports most recent BP on the soft side with SBP in 90's but denied lightheaded or dizziness.  BP this OV mildly elevated today: 148/70 repeat 142/88. Managed by GDMT as above. Encourage to monitor BP at home and submit BP log in 2 weeks. I am hesitant to up titrate medication with patient weight of 85 lbs.  Encourage physical activity for 150 minutes per week and heart healthy low sodium diet. Discussed limiting sodium intake to < 2 grams daily.     Requires supplemental oxygen  Patient on oxygen since leaving hospital.   Followed up with PCP who reduced O2 from 5L-3L without any concerns and normal O2 saturations levels >88%.   Patient is inquiring about either removing or reducing O2. At home patient has removed O2 and notes 1% drop in oxygen saturation. 5 meter walk test completed in office today was normal. Based on result, recommend decreasing O2 from 3L to 2L. Patient agrees. Encouraged to continue to monitor O2 saturations levels.  Referral to pulmonary.   HLD, LDL goal < 100 LDL 80 in 10/2022. Labs ordered by PCP. Scheduled for wellness visit in 10/2023 Continue pravastatin  80 mg daily and zetia  10 mg daily  PAD (peripheral artery disease) (HCC) Dr. Ladona agreed with PCP 10/01/2023 to discontinue Plavix  (for PAD) and continue with Eliquis  in setting of recent paroxysmal afib. Denies any claudication   Bilateral carotid artery stenosis Carotid US  01/2021: mild bilateral plaque <50% No bruits on exam. Denies presyncope/syncope.  Continue to monitor.  Will plan to repeat Carotid US  if bruit present or develops associated symptoms.    Pulmonary arterial hypertension (HCC) Severe tricuspid regurgitation ECHO 09/15/2023: normal RV function, RV moderately enlarged, severely elevated PASP, severe TV regurgitation  No signs of right sided HF.  Referral to  pulmonary for PAH and COPD.   Valvular heart disease ECHO 09/15/2023: mild MV regurgitation, moderate mitral annular calcification, moderate calcification of AV, AV sclerosis/calcification w/o AS  Continue to monitor.      Dispo: Follow up in 2 months with any APP.   Signed, Lorette CINDERELLA Kapur, PA-C

## 2023-10-15 ENCOUNTER — Encounter: Payer: Self-pay | Admitting: General Practice

## 2023-10-15 ENCOUNTER — Ambulatory Visit: Attending: Cardiology | Admitting: Physician Assistant

## 2023-10-15 VITALS — BP 142/88 | HR 67 | Ht 63.0 in | Wt 85.0 lb

## 2023-10-15 DIAGNOSIS — I110 Hypertensive heart disease with heart failure: Secondary | ICD-10-CM | POA: Diagnosis not present

## 2023-10-15 DIAGNOSIS — J9601 Acute respiratory failure with hypoxia: Secondary | ICD-10-CM | POA: Diagnosis not present

## 2023-10-15 DIAGNOSIS — Z170 Estrogen receptor positive status [ER+]: Secondary | ICD-10-CM | POA: Diagnosis not present

## 2023-10-15 DIAGNOSIS — I6523 Occlusion and stenosis of bilateral carotid arteries: Secondary | ICD-10-CM | POA: Diagnosis not present

## 2023-10-15 DIAGNOSIS — I10 Essential (primary) hypertension: Secondary | ICD-10-CM | POA: Diagnosis not present

## 2023-10-15 DIAGNOSIS — I071 Rheumatic tricuspid insufficiency: Secondary | ICD-10-CM

## 2023-10-15 DIAGNOSIS — I2721 Secondary pulmonary arterial hypertension: Secondary | ICD-10-CM | POA: Diagnosis not present

## 2023-10-15 DIAGNOSIS — E785 Hyperlipidemia, unspecified: Secondary | ICD-10-CM | POA: Diagnosis not present

## 2023-10-15 DIAGNOSIS — Z9981 Dependence on supplemental oxygen: Secondary | ICD-10-CM | POA: Diagnosis not present

## 2023-10-15 DIAGNOSIS — I5032 Chronic diastolic (congestive) heart failure: Secondary | ICD-10-CM

## 2023-10-15 DIAGNOSIS — I2510 Atherosclerotic heart disease of native coronary artery without angina pectoris: Secondary | ICD-10-CM | POA: Diagnosis not present

## 2023-10-15 DIAGNOSIS — C50411 Malignant neoplasm of upper-outer quadrant of right female breast: Secondary | ICD-10-CM | POA: Diagnosis not present

## 2023-10-15 DIAGNOSIS — I480 Paroxysmal atrial fibrillation: Secondary | ICD-10-CM

## 2023-10-15 DIAGNOSIS — I38 Endocarditis, valve unspecified: Secondary | ICD-10-CM

## 2023-10-15 DIAGNOSIS — J432 Centrilobular emphysema: Secondary | ICD-10-CM | POA: Diagnosis not present

## 2023-10-15 DIAGNOSIS — I739 Peripheral vascular disease, unspecified: Secondary | ICD-10-CM

## 2023-10-15 DIAGNOSIS — I5033 Acute on chronic diastolic (congestive) heart failure: Secondary | ICD-10-CM | POA: Diagnosis not present

## 2023-10-15 MED ORDER — DAPAGLIFLOZIN PROPANEDIOL 10 MG PO TABS: 10.0000 mg | ORAL_TABLET | Freq: Every day | ORAL | 0 refills | Status: DC

## 2023-10-15 MED ORDER — APIXABAN 2.5 MG PO TABS: 2.5000 mg | ORAL_TABLET | Freq: Two times a day (BID) | ORAL | 3 refills | Status: AC

## 2023-10-15 MED ORDER — EZETIMIBE 10 MG PO TABS: 10.0000 mg | ORAL_TABLET | Freq: Every day | ORAL | 3 refills | Status: AC

## 2023-10-15 MED ORDER — SPIRONOLACTONE 25 MG PO TABS: 25.0000 mg | ORAL_TABLET | Freq: Every day | ORAL | 3 refills | Status: AC

## 2023-10-15 MED ORDER — FUROSEMIDE 40 MG PO TABS: 40.0000 mg | ORAL_TABLET | Freq: Every day | ORAL | 3 refills | Status: AC

## 2023-10-15 NOTE — Patient Instructions (Addendum)
 Medication Instructions:  Refills for Eliquis , Farxiga , Zetia , Lasix , and Spironolactone  have been sent to your pharmacy.  *If you need a refill on your cardiac medications before your next appointment, please call your pharmacy*  Lab Work: None ordered today. If you have labs (blood work) drawn today and your tests are completely normal, you will receive your results only by: MyChart Message (if you have MyChart) OR A paper copy in the mail If you have any lab test that is abnormal or we need to change your treatment, we will call you to review the results.  Testing/Procedures: None ordered today.  Follow-Up: At [facility], you and your health needs are our priority.  As part of our continuing mission to provide you with exceptional heart care, our providers are all part of one team.  This team includes your primary Cardiologist (physician) and Advanced Practice Providers or APPs (Physician Assistants and Nurse Practitioners) who all work together to provide you with the care you need, when you need it.  Your next appointment:   2 months   Provider:   Josefa Beauvais, NP  We recommend signing up for the patient portal called MyChart.  Sign up information is provided on this After Visit Summary.  MyChart is used to connect with patients for Virtual Visits (Telemedicine).  Patients are able to view lab/test results, encounter notes, upcoming appointments, etc.  Non-urgent messages can be sent to your provider as well.   To learn more about what you can do with MyChart, go to [email or web address].   Other Instructions You have been referred to pulmonary medicine. Someone from their office will be in contact to schedule an appointment in the near future.

## 2023-10-15 NOTE — Progress Notes (Signed)
 Pre Surgical Assessment: 5 M Walk Test  91M=16.76ft  5 Meter Walk Test- trial 1: 6.75 seconds 5 Meter Walk Test- trial 2: 7.33 seconds 5 Meter Walk Test- trial 3: 6.75 seconds 5 Meter Walk Test Average: 6.94 seconds

## 2023-10-19 DIAGNOSIS — I110 Hypertensive heart disease with heart failure: Secondary | ICD-10-CM | POA: Diagnosis not present

## 2023-10-19 DIAGNOSIS — I2510 Atherosclerotic heart disease of native coronary artery without angina pectoris: Secondary | ICD-10-CM | POA: Diagnosis not present

## 2023-10-19 DIAGNOSIS — I5033 Acute on chronic diastolic (congestive) heart failure: Secondary | ICD-10-CM | POA: Diagnosis not present

## 2023-10-19 DIAGNOSIS — Z170 Estrogen receptor positive status [ER+]: Secondary | ICD-10-CM | POA: Diagnosis not present

## 2023-10-19 DIAGNOSIS — J432 Centrilobular emphysema: Secondary | ICD-10-CM | POA: Diagnosis not present

## 2023-10-19 DIAGNOSIS — I739 Peripheral vascular disease, unspecified: Secondary | ICD-10-CM | POA: Diagnosis not present

## 2023-10-19 DIAGNOSIS — C50411 Malignant neoplasm of upper-outer quadrant of right female breast: Secondary | ICD-10-CM | POA: Diagnosis not present

## 2023-10-19 DIAGNOSIS — J9601 Acute respiratory failure with hypoxia: Secondary | ICD-10-CM | POA: Diagnosis not present

## 2023-10-19 DIAGNOSIS — I480 Paroxysmal atrial fibrillation: Secondary | ICD-10-CM | POA: Diagnosis not present

## 2023-10-26 DIAGNOSIS — Z0000 Encounter for general adult medical examination without abnormal findings: Secondary | ICD-10-CM | POA: Diagnosis not present

## 2023-10-26 DIAGNOSIS — Z1331 Encounter for screening for depression: Secondary | ICD-10-CM | POA: Diagnosis not present

## 2023-10-26 DIAGNOSIS — Z23 Encounter for immunization: Secondary | ICD-10-CM | POA: Diagnosis not present

## 2023-10-27 ENCOUNTER — Other Ambulatory Visit (HOSPITAL_COMMUNITY): Payer: Self-pay

## 2023-10-27 DIAGNOSIS — I5033 Acute on chronic diastolic (congestive) heart failure: Secondary | ICD-10-CM | POA: Diagnosis not present

## 2023-10-27 DIAGNOSIS — J432 Centrilobular emphysema: Secondary | ICD-10-CM | POA: Diagnosis not present

## 2023-10-27 DIAGNOSIS — I480 Paroxysmal atrial fibrillation: Secondary | ICD-10-CM | POA: Diagnosis not present

## 2023-10-27 DIAGNOSIS — Z170 Estrogen receptor positive status [ER+]: Secondary | ICD-10-CM | POA: Diagnosis not present

## 2023-10-27 DIAGNOSIS — I110 Hypertensive heart disease with heart failure: Secondary | ICD-10-CM | POA: Diagnosis not present

## 2023-10-27 DIAGNOSIS — I739 Peripheral vascular disease, unspecified: Secondary | ICD-10-CM | POA: Diagnosis not present

## 2023-10-27 DIAGNOSIS — C50411 Malignant neoplasm of upper-outer quadrant of right female breast: Secondary | ICD-10-CM | POA: Diagnosis not present

## 2023-10-27 DIAGNOSIS — J9601 Acute respiratory failure with hypoxia: Secondary | ICD-10-CM | POA: Diagnosis not present

## 2023-10-27 DIAGNOSIS — I2510 Atherosclerotic heart disease of native coronary artery without angina pectoris: Secondary | ICD-10-CM | POA: Diagnosis not present

## 2023-10-28 ENCOUNTER — Other Ambulatory Visit: Payer: Self-pay

## 2023-10-29 ENCOUNTER — Inpatient Hospital Stay: Admitting: Pharmacist

## 2023-10-29 ENCOUNTER — Inpatient Hospital Stay: Attending: Hematology and Oncology

## 2023-10-29 VITALS — BP 90/63 | HR 82 | Temp 97.6°F | Resp 17 | Wt 82.0 lb

## 2023-10-29 DIAGNOSIS — J439 Emphysema, unspecified: Secondary | ICD-10-CM | POA: Diagnosis not present

## 2023-10-29 DIAGNOSIS — Z170 Estrogen receptor positive status [ER+]: Secondary | ICD-10-CM | POA: Diagnosis not present

## 2023-10-29 DIAGNOSIS — Z881 Allergy status to other antibiotic agents status: Secondary | ICD-10-CM | POA: Diagnosis not present

## 2023-10-29 DIAGNOSIS — Z79899 Other long term (current) drug therapy: Secondary | ICD-10-CM | POA: Diagnosis not present

## 2023-10-29 DIAGNOSIS — D3501 Benign neoplasm of right adrenal gland: Secondary | ICD-10-CM | POA: Diagnosis not present

## 2023-10-29 DIAGNOSIS — C50411 Malignant neoplasm of upper-outer quadrant of right female breast: Secondary | ICD-10-CM | POA: Insufficient documentation

## 2023-10-29 DIAGNOSIS — C7951 Secondary malignant neoplasm of bone: Secondary | ICD-10-CM | POA: Insufficient documentation

## 2023-10-29 DIAGNOSIS — D3502 Benign neoplasm of left adrenal gland: Secondary | ICD-10-CM | POA: Insufficient documentation

## 2023-10-29 DIAGNOSIS — Z7902 Long term (current) use of antithrombotics/antiplatelets: Secondary | ICD-10-CM | POA: Insufficient documentation

## 2023-10-29 DIAGNOSIS — Z79811 Long term (current) use of aromatase inhibitors: Secondary | ICD-10-CM | POA: Insufficient documentation

## 2023-10-29 DIAGNOSIS — Z880 Allergy status to penicillin: Secondary | ICD-10-CM | POA: Diagnosis not present

## 2023-10-29 DIAGNOSIS — C50919 Malignant neoplasm of unspecified site of unspecified female breast: Secondary | ICD-10-CM

## 2023-10-29 DIAGNOSIS — R197 Diarrhea, unspecified: Secondary | ICD-10-CM | POA: Diagnosis not present

## 2023-10-29 LAB — CBC WITH DIFFERENTIAL (CANCER CENTER ONLY)
Abs Immature Granulocytes: 0.02 K/uL (ref 0.00–0.07)
Basophils Absolute: 0.1 K/uL (ref 0.0–0.1)
Basophils Relative: 1 %
Eosinophils Absolute: 0.2 K/uL (ref 0.0–0.5)
Eosinophils Relative: 3 %
HCT: 37.9 % (ref 36.0–46.0)
Hemoglobin: 12.5 g/dL (ref 12.0–15.0)
Immature Granulocytes: 0 %
Lymphocytes Relative: 11 %
Lymphs Abs: 0.6 K/uL — ABNORMAL LOW (ref 0.7–4.0)
MCH: 37.9 pg — ABNORMAL HIGH (ref 26.0–34.0)
MCHC: 33 g/dL (ref 30.0–36.0)
MCV: 114.8 fL — ABNORMAL HIGH (ref 80.0–100.0)
Monocytes Absolute: 0.6 K/uL (ref 0.1–1.0)
Monocytes Relative: 10 %
Neutro Abs: 4.2 K/uL (ref 1.7–7.7)
Neutrophils Relative %: 75 %
Platelet Count: 232 K/uL (ref 150–400)
RBC: 3.3 MIL/uL — ABNORMAL LOW (ref 3.87–5.11)
RDW: 14.6 % (ref 11.5–15.5)
WBC Count: 5.6 K/uL (ref 4.0–10.5)
nRBC: 0 % (ref 0.0–0.2)

## 2023-10-29 LAB — CMP (CANCER CENTER ONLY)
ALT: 25 U/L (ref 0–44)
AST: 42 U/L — ABNORMAL HIGH (ref 15–41)
Albumin: 3.6 g/dL (ref 3.5–5.0)
Alkaline Phosphatase: 154 U/L — ABNORMAL HIGH (ref 38–126)
Anion gap: 6 (ref 5–15)
BUN: 37 mg/dL — ABNORMAL HIGH (ref 8–23)
CO2: 33 mmol/L — ABNORMAL HIGH (ref 22–32)
Calcium: 10.6 mg/dL — ABNORMAL HIGH (ref 8.9–10.3)
Chloride: 102 mmol/L (ref 98–111)
Creatinine: 1.23 mg/dL — ABNORMAL HIGH (ref 0.44–1.00)
GFR, Estimated: 43 mL/min — ABNORMAL LOW (ref >60)
Glucose, Bld: 91 mg/dL (ref 70–99)
Potassium: 4.4 mmol/L (ref 3.5–5.1)
Sodium: 141 mmol/L (ref 135–145)
Total Bilirubin: 0.5 mg/dL (ref 0.0–1.2)
Total Protein: 7.1 g/dL (ref 6.5–8.1)

## 2023-10-29 NOTE — Progress Notes (Signed)
 [facility]       Telephone: [phone]?Fax: [phone]   Oncology Clinical Pharmacist Practitioner Progress Note  Madison Richards is a 89 y.o. female with a diagnosis of breast cancer. They were contacted today via in-person visit. She is accompanied by her friend [name].   Current treatment regimen and start date Abemaciclib  (08/09/22) Letrozole  (08/06/22) Denosumab  120 mg (09/08/22)  Interval History She continues on abemaciclib  50 mg by mouth every 12 hours on days 1 to 28 of a 28-day cycle. This is being given in combination with letrozole  and denosumab  120 mg. Therapy is planned to continue until disease progression or unacceptable toxicity.  [name] was seen today by clinical pharmacy as a follow-up to her abemaciclib  management.  She was last seen by clinical pharmacy on 05/18/23 (telephone visit 10/01/23) and Dr. Odean on 08/20/23.  Dr. Odean has restaging  scans ordered for 11/20/23 but they have not been scheduled yet. Gave [name] radiology scheduling number today. She reports the swelling in her legs she had at last visit with Dr. Odean has subsided.  Response to Therapy [name] has been using oxygen since being discharged from the hospital for fluid on the lungs. She states her oxygen levels have remained above 90% and she can often not be on oxygen for 30 minutes before needing it again. She is seeing Dr. Theodoro on 11/10/23 from pulmonology.  She continues to tolerate abemaciclib  well. No side effects reported by patient. Her calcium is slightly elevated today after historically being low. We recommending holding the extra vitamin d  supplement until she sees Dr. Gudena on 11/26/23. She will continue with the calcium/vitamin d  combination supplement.  Her CA 15-3 and CA 27.29 will result tomorrow and go directly to Dr. Odean. Her AST and Alk Phos are elevated but no need to hold abemaciclib  at this time.   Labs, vitals, treatment parameters, and manufacturer  guidelines assessing toxicity were reviewed with [name] today. Based on these values, patient is in agreement to continue abemaciclib  therapy at this time.  Allergies Allergies  Allergen Reactions   Bacitracin-Polymyxin B Other (See Comments)    AREA APPLIED TO SPREAD   Fosamax [Alendronate Sodium] Other (See Comments)    Leg cramps   Simvastatin Other (See Comments)    leg pain   Clindamycin/Lincomycin Rash    Rash in mouth   Neosporin [Neomycin-Polymyxin-Gramicidin] Rash   Penicillins Rash    Rash in mouth    Vitals    10/29/2023    9:48 AM 10/15/2023   12:04 PM 10/15/2023   10:45 AM  Oncology Vitals  Height   160 cm  Weight 37.195 kg  38.556 kg  Weight (lbs) 82 lbs  85 lbs  BMI 14.53 kg/m2  15.06 kg/m2  Temp 97.6 F (36.4 C)    Pulse Rate 82  67  BP 90/63 142/88 140/70  Resp 17    SpO2 95 %  97 %  BSA (m2) 1.29 m2  1.31 m2    Laboratory Data    Latest Ref Rng & Units 10/29/2023    9:35 AM 09/25/2023    3:18 AM 09/24/2023    2:50 AM  CBC EXTENDED  WBC 4.0 - 10.5 K/uL 5.6  5.0  4.8   RBC 3.87 - 5.11 MIL/uL 3.30  2.67  2.46   Hemoglobin 12.0 - 15.0 g/dL 87.4  89.5  9.6   HCT 63.9 - 46.0 % 37.9  31.2  28.6  Platelets 150 - 400 K/uL 232  178  123   NEUT# 1.7 - 7.7 K/uL 4.2     Lymph# 0.7 - 4.0 K/uL 0.6          Latest Ref Rng & Units 10/29/2023    9:35 AM 09/28/2023    3:03 AM 09/27/2023    2:33 AM  CMP  Glucose 70 - 99 mg/dL 91  897  97   BUN 8 - 23 mg/dL 37  21  29   Creatinine 0.44 - 1.00 mg/dL 8.76  8.94  8.86   Sodium 135 - 145 mmol/L 141  135  135   Potassium 3.5 - 5.1 mmol/L 4.4  4.3  4.1   Chloride 98 - 111 mmol/L 102  96  96   CO2 22 - 32 mmol/L 33  31  32   Calcium 8.9 - 10.3 mg/dL 89.3  8.5  8.5   Total Protein 6.5 - 8.1 g/dL 7.1     Total Bilirubin 0.0 - 1.2 mg/dL 0.5     Alkaline Phos 38 - 126 U/L 154     AST 15 - 41 U/L 42     ALT 0 - 44 U/L 25       Lab Results  Component Value Date   MG 2.2 09/26/2023   MG 1.9 09/25/2023   MG  1.4 (L) 09/23/2023   Lab Results  Component Value Date   CA2729 69.0 (H) 08/20/2023   CA2729 59.3 (H) 05/18/2023   CA2729 54.2 (H) 02/23/2023   Lab Results  Component Value Date   CAN153 51.8 (H) 08/20/2023   RJW846 45.9 (H) 05/18/2023   RJW846 46.2 (H) 02/23/2023    Adverse Effects Assessment Alk Phos: 154 U/L, slightly elevated at 1.22 x ULN (Grade 1) AST: AST U/L, 1.03 x ULN (Grade 1) Calcium: elevated after historically being just below ULN. Will hold vitamin d  supplement as above and continue calcium/vitamin d  combo supplement until seeing Dr. Gudena next on 11/26/23.  Adherence Assessment [name] reports missing 0 doses over the past 4 weeks.   Reason for missed dose: N/A Patient was re-educated on importance of adherence.   Access Assessment [name] is currently receiving her abemaciclib  through [facility] Insurance concerns: None  Medication Reconciliation The patient's medication list was reviewed today with the patient?  Yes New medications or herbal supplements have recently been started?  No Any medications have been discontinued?  No The medication list was updated and reconciled based on the patient's most recent medication list in the electronic medical record (EMR) including herbal products and OTC medications.   Medications Current Outpatient Medications  Medication Sig Dispense Refill   abemaciclib  (VERZENIO ) 50 MG tablet Take 1 tablet (50 mg total) by mouth 2 (two) times daily. 56 tablet 5   acetaminophen  (TYLENOL ) 500 MG tablet Take 500 mg by mouth every 6 (six) hours as needed for moderate pain (pain score 4-6). ]     apixaban  (ELIQUIS ) 2.5 MG TABS tablet Take 1 tablet (2.5 mg total) by mouth 2 (two) times daily. 180 tablet 3   Calcium Carbonate-Vitamin D  (CALCIUM 600 + D PO) Take 1 tablet by mouth 2 (two) times daily.     Coenzyme Q10 (COQ10 PO) Take 200 mg by mouth.     colestipol  (COLESTID ) 1 g tablet Take 2 g by  mouth daily.     dapagliflozin  propanediol (FARXIGA ) 10 MG TABS tablet Take 1 tablet (10 mg total)  by mouth daily. 30 tablet 0   denosumab  (XGEVA ) 120 MG/1.7ML SOLN injection Inject 120 mg into the skin every 3 (three) months.     estradiol  (ESTRACE  VAGINAL) 0.1 MG/GM vaginal cream Place 1 Applicatorful vaginally 2 (two) times a week. 42.5 g 1   ezetimibe  (ZETIA ) 10 MG tablet Take 1 tablet (10 mg total) by mouth daily. 90 tablet 3   feeding supplement (ENSURE PLUS HIGH PROTEIN) LIQD Take 237 mLs by mouth 3 (three) times daily between meals. 21330 mL 0   fexofenadine (ALLEGRA) 180 MG tablet Take 180 mg by mouth daily as needed for allergies.     fluticasone  furoate-vilanterol (BREO ELLIPTA ) 200-25 MCG/ACT AEPB Inhale 1 puff into the lungs daily. 60 each 0   furosemide  (LASIX ) 40 MG tablet Take 1 tablet (40 mg total) by mouth daily. 90 tablet 3   ipratropium-albuterol  (DUONEB) 0.5-2.5 (3) MG/3ML SOLN Take 3 mLs by nebulization every 6 (six) hours as needed (shortness of breath and wheezing). 360 mL 0   letrozole  (FEMARA ) 2.5 MG tablet TAKE 1 TABLET BY MOUTH EVERY DAY 90 tablet 3   metoprolol  tartrate (LOPRESSOR ) 25 MG tablet TAKE 1 TABLET BY MOUTH TWICE A DAY 180 tablet 3   Multiple Vitamin (MULTIVITAMIN WITH MINERALS) TABS Take 1 tablet by mouth daily.     pravastatin  (PRAVACHOL ) 80 MG tablet Take 1 tablet (80 mg total) by mouth at bedtime. 90 tablet 3   spironolactone  (ALDACTONE ) 25 MG tablet Take 1 tablet (25 mg total) by mouth daily. 90 tablet 3   Cholecalciferol  (VITAMIN D3) 5000 UNITS TABS Take 1 tablet by mouth every Monday, Wednesday, and Friday. (Patient not taking: Reported on 10/29/2023)     No current facility-administered medications for this visit.   Drug-Drug Interactions (DDIs) DDIs were evaluated?  Yes Significant DDIs?  No The patient was instructed to speak with their health care provider and/or the oral chemotherapy pharmacist before starting any new drug, including  prescription or over the counter, natural / herbal products, or vitamins.  Supportive Care Diarrhea: we reviewed that diarrhea is common with abemaciclib  and confirmed that she does have loperamide (Imodium) at home.  We reviewed how to take this medication PRN. Neutropenia: we discussed the importance of having a thermometer and what the Centers for Disease Control and Prevention (CDC) considers a fever which is 100.36F (38C) or higher.  Gave patient 24/7 triage line to call if any fevers or symptoms. ILD/Pneumonitis: we reviewed potential symptoms including cough, shortness, and fatigue.  VTE: reviewed signs of DVT such as leg swelling, redness, pain, or tenderness and signs of PE such as shortness of breath, rapid or irregular heartbeat, cough, chest pain, or lightheadedness. Reviewed to take the medication every 12 hours (with food sometimes can be easier on the stomach) and to take it at the same time every day. Hepatotoxicity: WNL Drug interactions with grapefruit products Monitor peripheral edema which may be from denosumab .   Dosing Assessment Hepatic adjustments needed?  No Renal adjustments needed?  No Toxicity adjustments needed?  No The current dosing regimen is appropriate to continue at this time.  Follow-Up Plan Continue abemaciclib  50 mg by mouth every 12 hours Continue letrozole  2.5 mg by mouth daily Continue denosumab  120 mg every 12 weeks. Will be given next on 11/26/23 Use loperamide as needed for diarrhea Tumor markers will come back tomorrow Holding vitamin D , continuing calcium/vitamin D . Monitor calcium as was slightly elevated today Monitor LFTs and kidney function. Alk Phos and AST elevated  today. Serum creatinine and BUN elevated today Restaging scans ordered for 11/20/23, not scheduled yet. Radiology scheduling number given to patient today. Dr. Odean visit, and denosumab  120 mg on 11/26/23. Will review scans at this time. Will have labs on 11/20/23 FREDDYE CARDAMONE can follow up with clinical pharmacy as deemed necessary by Dr. Mackey Odean going forward   [name] participated in the discussion, expressed understanding, and voiced agreement with the above plan. All questions were answered to her satisfaction. The patient was advised to contact the clinic at [phone] with any questions or concerns prior to her return visit.   I spent 30 minutes assessing and educating the patient.  Katrinia Straker A. Lucila, PharmD, BCOP, CPP  Norleen DELENA Lucila, RPH-CPP, 10/29/2023  10:40 AM   **Disclaimer: This note was dictated with voice recognition software. Similar sounding words can inadvertently be transcribed and this note may contain transcription errors which may not have been corrected upon publication of note.**

## 2023-10-30 DIAGNOSIS — I5033 Acute on chronic diastolic (congestive) heart failure: Secondary | ICD-10-CM | POA: Diagnosis not present

## 2023-10-30 LAB — CANCER ANTIGEN 15-3: CA 15-3: 62.9 U/mL — ABNORMAL HIGH (ref 0.0–25.0)

## 2023-10-30 LAB — CANCER ANTIGEN 27.29: CA 27.29: 96.5 U/mL — ABNORMAL HIGH (ref 0.0–38.6)

## 2023-10-31 ENCOUNTER — Other Ambulatory Visit: Payer: Self-pay | Admitting: Hematology and Oncology

## 2023-11-02 ENCOUNTER — Other Ambulatory Visit: Payer: Self-pay

## 2023-11-02 DIAGNOSIS — Z170 Estrogen receptor positive status [ER+]: Secondary | ICD-10-CM | POA: Diagnosis not present

## 2023-11-02 DIAGNOSIS — J9601 Acute respiratory failure with hypoxia: Secondary | ICD-10-CM | POA: Diagnosis not present

## 2023-11-02 DIAGNOSIS — I739 Peripheral vascular disease, unspecified: Secondary | ICD-10-CM | POA: Diagnosis not present

## 2023-11-02 DIAGNOSIS — I5033 Acute on chronic diastolic (congestive) heart failure: Secondary | ICD-10-CM | POA: Diagnosis not present

## 2023-11-02 DIAGNOSIS — I480 Paroxysmal atrial fibrillation: Secondary | ICD-10-CM | POA: Diagnosis not present

## 2023-11-02 DIAGNOSIS — I110 Hypertensive heart disease with heart failure: Secondary | ICD-10-CM | POA: Diagnosis not present

## 2023-11-02 DIAGNOSIS — C50411 Malignant neoplasm of upper-outer quadrant of right female breast: Secondary | ICD-10-CM | POA: Diagnosis not present

## 2023-11-02 DIAGNOSIS — J432 Centrilobular emphysema: Secondary | ICD-10-CM | POA: Diagnosis not present

## 2023-11-02 DIAGNOSIS — I2510 Atherosclerotic heart disease of native coronary artery without angina pectoris: Secondary | ICD-10-CM | POA: Diagnosis not present

## 2023-11-02 MED ORDER — ABEMACICLIB 50 MG PO TABS
50.0000 mg | ORAL_TABLET | Freq: Two times a day (BID) | ORAL | 5 refills | Status: AC
  Filled 2023-11-02: qty 56, 28d supply, fill #0
  Filled 2023-12-01: qty 56, 28d supply, fill #1
  Filled 2023-12-24: qty 56, 28d supply, fill #2
  Filled 2024-01-22: qty 56, 28d supply, fill #3
  Filled 2024-02-19: qty 56, 28d supply, fill #4
  Filled 2024-03-18: qty 56, 28d supply, fill #5

## 2023-11-02 NOTE — Progress Notes (Signed)
 Specialty Pharmacy Refill Coordination Note  Madison Richards is a 89 y.o. female contacted today regarding refills of specialty medication(s) Abemaciclib  (VERZENIO )   Patient requested [name] at [facility] at [place] date: 11/06/23   Medication will be filled on 11/05/23, pending refill approval.

## 2023-11-02 NOTE — Telephone Encounter (Signed)
 Refill per last office note

## 2023-11-04 ENCOUNTER — Other Ambulatory Visit: Payer: Self-pay

## 2023-11-04 NOTE — Progress Notes (Signed)
 Specialty Pharmacy Ongoing Clinical Assessment Note  Madison Richards is a 89 y.o. female who is being followed by the specialty pharmacy service for RxSp Oncology   Patient's specialty medication(s) reviewed today: Abemaciclib  (VERZENIO )   Missed doses in the last 4 weeks: 0   Patient/Caregiver did not have any additional questions or concerns.   Therapeutic benefit summary: Patient is achieving benefit   Adverse events/side effects summary: No adverse events/side effects   Patient's therapy is appropriate to: Continue    Goals Addressed             This Visit's Progress    Slow Disease Progression   On track    Patient is scheduled for follow-up CT in 1 month due to increased CA levels. Patient will maintain adherence.        Follow up: 6 months  [name] Gallus Specialty Pharmacist

## 2023-11-06 ENCOUNTER — Other Ambulatory Visit (HOSPITAL_COMMUNITY): Payer: Self-pay

## 2023-11-09 DIAGNOSIS — I739 Peripheral vascular disease, unspecified: Secondary | ICD-10-CM | POA: Diagnosis not present

## 2023-11-09 DIAGNOSIS — C50411 Malignant neoplasm of upper-outer quadrant of right female breast: Secondary | ICD-10-CM | POA: Diagnosis not present

## 2023-11-09 DIAGNOSIS — I110 Hypertensive heart disease with heart failure: Secondary | ICD-10-CM | POA: Diagnosis not present

## 2023-11-09 DIAGNOSIS — Z170 Estrogen receptor positive status [ER+]: Secondary | ICD-10-CM | POA: Diagnosis not present

## 2023-11-09 DIAGNOSIS — I5033 Acute on chronic diastolic (congestive) heart failure: Secondary | ICD-10-CM | POA: Diagnosis not present

## 2023-11-09 DIAGNOSIS — J432 Centrilobular emphysema: Secondary | ICD-10-CM | POA: Diagnosis not present

## 2023-11-09 DIAGNOSIS — I2510 Atherosclerotic heart disease of native coronary artery without angina pectoris: Secondary | ICD-10-CM | POA: Diagnosis not present

## 2023-11-09 DIAGNOSIS — J9601 Acute respiratory failure with hypoxia: Secondary | ICD-10-CM | POA: Diagnosis not present

## 2023-11-09 DIAGNOSIS — I480 Paroxysmal atrial fibrillation: Secondary | ICD-10-CM | POA: Diagnosis not present

## 2023-11-10 ENCOUNTER — Ambulatory Visit

## 2023-11-10 VITALS — BP 100/63 | HR 68 | Ht 63.0 in | Wt 81.8 lb

## 2023-11-10 DIAGNOSIS — I071 Rheumatic tricuspid insufficiency: Secondary | ICD-10-CM | POA: Diagnosis not present

## 2023-11-10 DIAGNOSIS — J90 Pleural effusion, not elsewhere classified: Secondary | ICD-10-CM | POA: Diagnosis not present

## 2023-11-10 DIAGNOSIS — I5032 Chronic diastolic (congestive) heart failure: Secondary | ICD-10-CM

## 2023-11-10 DIAGNOSIS — I272 Pulmonary hypertension, unspecified: Secondary | ICD-10-CM | POA: Diagnosis not present

## 2023-11-10 DIAGNOSIS — J431 Panlobular emphysema: Secondary | ICD-10-CM | POA: Diagnosis not present

## 2023-11-10 DIAGNOSIS — Z87891 Personal history of nicotine dependence: Secondary | ICD-10-CM

## 2023-11-10 MED ORDER — ALBUTEROL SULFATE HFA 108 (90 BASE) MCG/ACT IN AERS: 2.0000 | INHALATION_SPRAY | Freq: Four times a day (QID) | RESPIRATORY_TRACT | 6 refills | Status: DC | PRN

## 2023-11-10 MED ORDER — ALBUTEROL SULFATE HFA 108 (90 BASE) MCG/ACT IN AERS: 2.0000 | INHALATION_SPRAY | Freq: Four times a day (QID) | RESPIRATORY_TRACT | 6 refills | Status: AC | PRN

## 2023-11-10 MED ORDER — UMECLIDINIUM-VILANTEROL 62.5-25 MCG/ACT IN AEPB: 1.0000 | INHALATION_SPRAY | Freq: Every day | RESPIRATORY_TRACT | 5 refills | Status: DC

## 2023-11-10 NOTE — Patient Instructions (Signed)
 Notification of test results are managed in the following manner: If there are any recommendations or changes to the plan of care discussed in office today, we will contact you and let you know what they are. If you do not hear from us , then your results are normal/expected and you can view them through your MyChart account, or a letter will be sent to you. Thank you again for trusting us  with your care [facility].

## 2023-11-10 NOTE — Assessment & Plan Note (Addendum)
 CT scan reviewed by me does show underlying emphysematous changes. Breo discontinued. Anoro Ellipta  and albuterol  as needed.  Orders:   Pulmonary Function Test; Future   umeclidinium-vilanterol (ANORO ELLIPTA ) 62.5-25 MCG/ACT AEPB; Inhale 1 puff into the lungs daily.   albuterol  (VENTOLIN  HFA) 108 (90 Base) MCG/ACT inhaler; Inhale 2 puffs into the lungs every 6 (six) hours as needed for wheezing or shortness of breath.

## 2023-11-10 NOTE — Progress Notes (Signed)
 New Patient Pulmonology Office Visit   Subjective:  Patient ID: Madison Richards, female    DOB: 08-05-1934  MRN: [id number]  Referred by: Sheron Lorette GRADE, PA-C  CC:  Chief Complaint  Patient presents with   Consult    Admitted to hospital 09/21/23 (9 days) for fluid in lungs, says her oxygen struggled to stay up in the hospital. She is currently on 2L cont o2 24/7, although she has not used over the last 3 days and states she would like to be off the oxygen.     HPI Madison Richards is a 89 y.o. female with chronic diastolic heart failure, hypertension, PAD, paroxysmal A-fib, severe TR, COPD, breast cancer with mets on chemo Xgeva , w daily letrozole  and verzenio  .  Presenting for evaluation of hypoxia, COPD and concern for pulmonary hypertension.  Admission August 2025: Treated for heart failure exacerbation and COPD exacerbation.  Had right-sided thoracentesis with removal of 600 cc, transudate. Symptomatic improvement with fluid removal. DC on home O2.   No sob currently. Not on home O2 for 2 days. No cough, phlegm, orthopnea which now resolved, leg swelling has resolved. No wheezing. Chronic cough always clear phlegm.  No prior prednisone  use till this hospitalization.  Started on Breo ellipta  in hospital. Has been using it but has not been helping her. Has not been using albuterol .  No asthma or fam hx of asthma.   Lung Health: Functional status: walked 5 min yesterday w/o SOB. 3 months ago 1/2 block.  Covid vaccine: 3 Influenza vaccine: utd.  Pneumonococcal vaccine: last PNA vaccine many years ago.  Smoking: <1 pk x 60 years, quit 1 year ago.  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   Testing: Eosinophil normal.  CT chest August 2025: Reviewed by me: Shows underlying emphysematous changes with ground glass opacity in right upper lobe, bilateral effusion with right lower lobe opacity likely atelectasis.  No PE.  Echo: July 2025: EF 74%, mild LVH, grade 1 DD, severely  elevated PASP, estimated RVSP 63, TR max 3.45 m/s, LA mildly dilated, RA moderately dilated, severe TR. Patient was significantly swollen at that time.    Allergies: Bacitracin-polymyxin b, Fosamax [alendronate sodium], Simvastatin, Clindamycin/lincomycin, Neosporin [neomycin-polymyxin-gramicidin], and Penicillins  History: Past Medical History:  Diagnosis Date   Abdominal aortic aneurysm (AAA)    Acute ischemic stroke (HCC) 02/09/2018   Breast cancer (HCC) 06/2022   right   Generalized headaches    due to neck pain   GERD (gastroesophageal reflux disease)    Hyperlipidemia    Hypertension    Neck pain    Osteoporosis    Stroke [facility])    Past Surgical History:  Procedure Laterality Date   ABDOMINAL ANGIOGRAM Bilateral 03/02/2012   Procedure: ABDOMINAL ANGIOGRAM;  Surgeon: Erick JONELLE Bergamo, MD;  Location: [facility] CATH LAB;  Service: Cardiovascular;  Laterality: Bilateral;   ABDOMINAL ANGIOGRAM  04/06/2012   Procedure: ABDOMINAL ANGIOGRAM;  Surgeon: Erick JONELLE Bergamo, MD;  Location: [facility] CATH LAB;  Service: Cardiovascular;;   abdominal tumor     removed in 1968/benign   APPENDECTOMY     AUGMENTATION MAMMAPLASTY  2000   REMOVED in 2000   BREAST BIOPSY Right 07/01/2022   US  RT BREAST BX W LOC DEV 1ST LESION IMG BX SPEC US  GUIDE 07/01/2022 [facility] MAMMOGRAPHY   BREAST BIOPSY Left 07/01/2022   US  LT BREAST BX W LOC DEV 1ST LESION IMG BX SPEC US  GUIDE 07/01/2022 [facility]MO[facility]   ILIAC ARTERY STENT  right side 2003 and 2014   IR THORACENTESIS ASP PLEURAL SPACE W/IMG GUIDE  09/29/2023   Family History  Problem Relation Age of Onset   Heart disease Sister    Heart disease Brother    Heart disease Brother    Throat cancer Brother 21       SCC   Breast cancer Niece 110   Ovarian cancer Niece        dx 50s   Colon cancer Neg Hx    Social History   Socioeconomic History   Marital status: Widowed    Spouse name: Not on file   Number of children: 2   Years of education: Not on file    Highest education level: Not on file  Occupational History   Not on file  Tobacco Use   Smoking status: Former    Current packs/day: 0.00    Types: Cigarettes    Quit date: 02/07/2018    Years since quitting: 5.7   Smokeless tobacco: Never   Tobacco comments:    Smoked for about 60 years, about 3/4 ppd.  Vaping Use   Vaping status: Never Used  Substance and Sexual Activity   Alcohol use: Yes    Alcohol/week: 1.0 standard drink of alcohol    Types: 1 Glasses of wine per week    Comment: daily   Drug use: No   Sexual activity: Not on file  Other Topics Concern   Not on file  Social History Narrative   Not on file   Social Drivers of Health   Financial Resource Strain: Not on file  Food Insecurity: No Food Insecurity (09/21/2023)   Hunger Vital Sign    Worried About Running Out of Food in the Last Year: Never true    Ran Out of Food in the Last Year: Never true  Transportation Needs: No Transportation Needs (09/21/2023)   PRAPARE - Administrator, Civil Service (Medical): No    Lack of Transportation (Non-Medical): No  Physical Activity: Not on file  Stress: Not on file  Social Connections: Unknown (09/21/2023)   Social Connection and Isolation Panel    Frequency of Communication with Friends and Family: More than three times a week    Frequency of Social Gatherings with Friends and Family: More than three times a week    Attends Religious Services: Not on file    Active Member of Clubs or Organizations: Yes    Attends Banker Meetings: More than 4 times per year    Marital Status: Widowed  Intimate Partner Violence: Patient Declined (09/21/2023)   Humiliation, Afraid, Rape, and Kick questionnaire    Fear of Current or Ex-Partner: Patient declined    Emotionally Abused: Patient declined    Physically Abused: Patient declined    Sexually Abused: Patient declined    Immunization status: Immunization History  Administered Date(s) Administered    Influenza-Unspecified 11/19/2017    Current Meds:  Current Outpatient Medications:    abemaciclib  (VERZENIO ) 50 MG tablet, Take 1 tablet (50 mg total) by mouth 2 (two) times daily., Disp: 56 tablet, Rfl: 5   acetaminophen  (TYLENOL ) 500 MG tablet, Take 500 mg by mouth every 6 (six) hours as needed for moderate pain (pain score 4-6). ], Disp: , Rfl:    apixaban  (ELIQUIS ) 2.5 MG TABS tablet, Take 1 tablet (2.5 mg total) by mouth 2 (two) times daily., Disp: 180 tablet, Rfl: 3   Calcium Carbonate-Vitamin D  (CALCIUM 600 + D PO), Take 1  tablet by mouth 2 (two) times daily., Disp: , Rfl:    Coenzyme Q10 (COQ10 PO), Take 200 mg by mouth., Disp: , Rfl:    colestipol  (COLESTID ) 1 g tablet, Take 2 g by mouth daily., Disp: , Rfl:    dapagliflozin  propanediol (FARXIGA ) 10 MG TABS tablet, Take 1 tablet (10 mg total) by mouth daily., Disp: 30 tablet, Rfl: 0   denosumab  (XGEVA ) 120 MG/1.7ML SOLN injection, Inject 120 mg into the skin every 3 (three) months., Disp: , Rfl:    estradiol  (ESTRACE  VAGINAL) 0.1 MG/GM vaginal cream, Place 1 Applicatorful vaginally 2 (two) times a week. (Patient taking differently: Place 1 Applicatorful vaginally as needed.), Disp: 42.5 g, Rfl: 1   ezetimibe  (ZETIA ) 10 MG tablet, Take 1 tablet (10 mg total) by mouth daily., Disp: 90 tablet, Rfl: 3   feeding supplement (ENSURE PLUS HIGH PROTEIN) LIQD, Take 237 mLs by mouth 3 (three) times daily between meals. (Patient taking differently: Take 237 mLs by mouth 2 (two) times daily between meals.), Disp: 21330 mL, Rfl: 0   fexofenadine (ALLEGRA) 180 MG tablet, Take 180 mg by mouth daily as needed for allergies., Disp: , Rfl:    furosemide  (LASIX ) 40 MG tablet, Take 1 tablet (40 mg total) by mouth daily., Disp: 90 tablet, Rfl: 3   ipratropium-albuterol  (DUONEB) 0.5-2.5 (3) MG/3ML SOLN, Take 3 mLs by nebulization every 6 (six) hours as needed (shortness of breath and wheezing)., Disp: 360 mL, Rfl: 0   letrozole  (FEMARA ) 2.5 MG tablet, TAKE 1  TABLET BY MOUTH EVERY DAY, Disp: 90 tablet, Rfl: 3   metoprolol  tartrate (LOPRESSOR ) 25 MG tablet, TAKE 1 TABLET BY MOUTH TWICE A DAY, Disp: 180 tablet, Rfl: 3   Multiple Vitamin (MULTIVITAMIN WITH MINERALS) TABS, Take 1 tablet by mouth daily., Disp: , Rfl:    pravastatin  (PRAVACHOL ) 80 MG tablet, Take 1 tablet (80 mg total) by mouth at bedtime., Disp: 90 tablet, Rfl: 3   spironolactone  (ALDACTONE ) 25 MG tablet, Take 1 tablet (25 mg total) by mouth daily., Disp: 90 tablet, Rfl: 3   albuterol  (VENTOLIN  HFA) 108 (90 Base) MCG/ACT inhaler, Inhale 2 puffs into the lungs every 6 (six) hours as needed for wheezing or shortness of breath., Disp: 51 each, Rfl: 6   Cholecalciferol  (VITAMIN D3) 5000 UNITS TABS, Take 1 tablet by mouth every Monday, Wednesday, and Friday. (Patient not taking: Reported on 11/10/2023), Disp: , Rfl:    umeclidinium-vilanterol (ANORO ELLIPTA ) 62.5-25 MCG/ACT AEPB, Inhale 1 puff into the lungs daily., Disp: 1 each, Rfl: 5      Objective:  BP 100/63   Pulse 68   Ht 5' 3 (1.6 m)   Wt 81 lb 12.8 oz (37.1 kg)   SpO2 96% Comment: RA  BMI 14.49 kg/m  BMI Readings from Last 3 Encounters:  11/10/23 14.49 kg/m  10/29/23 14.53 kg/m  10/15/23 15.06 kg/m    General: Elderly lady in no acute distress Lungs: clear to auscultation bilaterally.  Heart: regular rate rhythm, no murmur appreciated.  Abdomen: non tender, non distended. Normal BS.  Neuro: axox3.  Moving all extremities.  No leg swelling bilaterally.   Diagnostic Review:  Last metabolic panel Lab Results  Component Value Date   GLUCOSE 91 10/29/2023   NA 141 10/29/2023   K 4.4 10/29/2023   CL 102 10/29/2023   CO2 33 (H) 10/29/2023   BUN 37 (H) 10/29/2023   CREATININE 1.23 (H) 10/29/2023   GFRNONAA 43 (L) 10/29/2023   CALCIUM 10.6 (H) 10/29/2023  PROT 7.1 10/29/2023   ALBUMIN 3.6 10/29/2023   BILITOT 0.5 10/29/2023   ALKPHOS 154 (H) 10/29/2023   AST 42 (H) 10/29/2023   ALT 25 10/29/2023   ANIONGAP 6  10/29/2023       Assessment & Plan:   Assessment & Plan Pulmonary hypertension (HCC) Suspected postcapillary.  Will repeat echo to evaluate RVSP.  Continue diuresis. Orders:   ECHOCARDIOGRAM COMPLETE; Future  Panlobular emphysema (HCC) CT scan reviewed by me does show underlying emphysematous changes. Breo discontinued. Anoro Ellipta  and albuterol  as needed.  Orders:   Pulmonary Function Test; Future   umeclidinium-vilanterol (ANORO ELLIPTA ) 62.5-25 MCG/ACT AEPB; Inhale 1 puff into the lungs daily.   albuterol  (VENTOLIN  HFA) 108 (90 Base) MCG/ACT inhaler; Inhale 2 puffs into the lungs every 6 (six) hours as needed for wheezing or shortness of breath.  Severe tricuspid regurgitation Due to fluid overload.  Status post aggressive diuresis in the hospital.  Repeat echo to evaluate status of TR. Orders:   ECHOCARDIOGRAM COMPLETE; Future  Pleural effusion Due to heart failure.  Exudative effusion.  Continue diuresis.    Chronic heart failure with preserved ejection fraction (HCC) Continue with diuresis.  Follows cardiology.  Repeat echo as above.      Return in about 3 months (around 02/09/2024).   Kenli Waldo, MD

## 2023-11-10 NOTE — Assessment & Plan Note (Signed)
 Due to fluid overload.  Status post aggressive diuresis in the hospital.  Repeat echo to evaluate status of TR. Orders:   ECHOCARDIOGRAM COMPLETE; Future

## 2023-11-11 ENCOUNTER — Ambulatory Visit

## 2023-11-11 ENCOUNTER — Ambulatory Visit: Payer: Self-pay

## 2023-11-11 DIAGNOSIS — J431 Panlobular emphysema: Secondary | ICD-10-CM

## 2023-11-11 LAB — PULMONARY FUNCTION TEST
DL/VA % pred: 30 %
DL/VA: 1.24 ml/min/mmHg/L
DLCO cor % pred: 26 %
DLCO cor: 4.76 ml/min/mmHg
DLCO unc % pred: 25 %
DLCO unc: 4.62 ml/min/mmHg
FEF 25-75 Post: 0.57 L/s
FEF 25-75 Pre: 0.44 L/s
FEF2575-%Change-Post: 28 %
FEF2575-%Pred-Post: 54 %
FEF2575-%Pred-Pre: 42 %
FEV1FVC-%Change-Post: 3 ml/min/mmHg/L
FEV1FVC-%Pred-Pre: 71 L
FEV1FVC-%Pred-Pre: 71 ml/min/mmHg
FEV6-%Pred-Pre: 100 %
FEV6-%Pred-Pre: 100 L
FEV6-Pre: 2.09 L/s
FEV6FVC-%Change-Post: -1 %
FEV6FVC-%Pred-Post: 102 %
FEV6FVC-%Pred-Pre: 104 %
FEV6FVC-%Pred-Pre: 104 %
FVC-%Change-Post: 5 L
FVC-%Pred-Post: 101 %
FVC-%Pred-Pre: 96 %
FVC-Post: 2.25 L
FVC-Pre: 2.14 L
Post FEV1/FVC ratio: 53 %
Post FEV6/FVC ratio: 96 %
Pre FEV1/FVC ratio: 67 %
Pre FEV6/FVC Ratio: 98 %
Pre FEV6/FVC Ratio: 98 %
RV % pred: 104 %
RV % pred: 75 %
RV: 1.86 L
RV: 2.16 L
TLC % pred: 82 %
TLC % pred: 82 %
TLC: 4.06 L
TLC: 4.06 L

## 2023-11-11 NOTE — Progress Notes (Signed)
 Full PFT performed today.

## 2023-11-11 NOTE — Patient Instructions (Signed)
 Full PFT performed today.

## 2023-11-12 DIAGNOSIS — R42 Dizziness and giddiness: Secondary | ICD-10-CM | POA: Diagnosis not present

## 2023-11-12 DIAGNOSIS — E46 Unspecified protein-calorie malnutrition: Secondary | ICD-10-CM | POA: Diagnosis not present

## 2023-11-12 DIAGNOSIS — Z681 Body mass index (BMI) 19 or less, adult: Secondary | ICD-10-CM | POA: Diagnosis not present

## 2023-11-12 DIAGNOSIS — C7951 Secondary malignant neoplasm of bone: Secondary | ICD-10-CM | POA: Diagnosis not present

## 2023-11-12 DIAGNOSIS — Z23 Encounter for immunization: Secondary | ICD-10-CM | POA: Diagnosis not present

## 2023-11-12 DIAGNOSIS — C50411 Malignant neoplasm of upper-outer quadrant of right female breast: Secondary | ICD-10-CM | POA: Diagnosis not present

## 2023-11-12 NOTE — Progress Notes (Signed)
 Hi Madison Richards.  The following will be my interpretation of hte lung function test.  Mild obstructive lung disease with borderline restriction.  No air trapping.  Severely reduced DLCO out of proportion of spirometry and lung volumes suggesting underlying pulmonary vascular disease. What this basically means is that you have mild copd but your gas exchange is severely hampered likely by underlying pulmonary HTN. Will wait on ECHO to be resulted.

## 2023-11-17 DIAGNOSIS — I5033 Acute on chronic diastolic (congestive) heart failure: Secondary | ICD-10-CM | POA: Diagnosis not present

## 2023-11-17 DIAGNOSIS — J432 Centrilobular emphysema: Secondary | ICD-10-CM | POA: Diagnosis not present

## 2023-11-17 DIAGNOSIS — I110 Hypertensive heart disease with heart failure: Secondary | ICD-10-CM | POA: Diagnosis not present

## 2023-11-17 DIAGNOSIS — I480 Paroxysmal atrial fibrillation: Secondary | ICD-10-CM | POA: Diagnosis not present

## 2023-11-17 DIAGNOSIS — J9601 Acute respiratory failure with hypoxia: Secondary | ICD-10-CM | POA: Diagnosis not present

## 2023-11-17 DIAGNOSIS — I739 Peripheral vascular disease, unspecified: Secondary | ICD-10-CM | POA: Diagnosis not present

## 2023-11-17 DIAGNOSIS — I2510 Atherosclerotic heart disease of native coronary artery without angina pectoris: Secondary | ICD-10-CM | POA: Diagnosis not present

## 2023-11-17 DIAGNOSIS — C50411 Malignant neoplasm of upper-outer quadrant of right female breast: Secondary | ICD-10-CM | POA: Diagnosis not present

## 2023-11-17 DIAGNOSIS — Z170 Estrogen receptor positive status [ER+]: Secondary | ICD-10-CM | POA: Diagnosis not present

## 2023-11-19 ENCOUNTER — Other Ambulatory Visit: Payer: Self-pay | Admitting: *Deleted

## 2023-11-19 DIAGNOSIS — Z170 Estrogen receptor positive status [ER+]: Secondary | ICD-10-CM

## 2023-11-20 ENCOUNTER — Inpatient Hospital Stay: Attending: Hematology and Oncology

## 2023-11-20 ENCOUNTER — Ambulatory Visit (HOSPITAL_COMMUNITY)
Admission: RE | Admit: 2023-11-20 | Discharge: 2023-11-20 | Disposition: A | Discharge status: Home / Self Care | Source: Ambulatory Visit | Attending: Hematology and Oncology | Admitting: Hematology and Oncology

## 2023-11-20 DIAGNOSIS — J439 Emphysema, unspecified: Secondary | ICD-10-CM | POA: Insufficient documentation

## 2023-11-20 DIAGNOSIS — Z880 Allergy status to penicillin: Secondary | ICD-10-CM | POA: Diagnosis not present

## 2023-11-20 DIAGNOSIS — D3501 Benign neoplasm of right adrenal gland: Secondary | ICD-10-CM | POA: Insufficient documentation

## 2023-11-20 DIAGNOSIS — C50411 Malignant neoplasm of upper-outer quadrant of right female breast: Secondary | ICD-10-CM | POA: Insufficient documentation

## 2023-11-20 DIAGNOSIS — Z7902 Long term (current) use of antithrombotics/antiplatelets: Secondary | ICD-10-CM | POA: Diagnosis not present

## 2023-11-20 DIAGNOSIS — Z170 Estrogen receptor positive status [ER+]: Secondary | ICD-10-CM | POA: Insufficient documentation

## 2023-11-20 DIAGNOSIS — C7951 Secondary malignant neoplasm of bone: Secondary | ICD-10-CM | POA: Diagnosis present

## 2023-11-20 DIAGNOSIS — J90 Pleural effusion, not elsewhere classified: Secondary | ICD-10-CM | POA: Diagnosis not present

## 2023-11-20 DIAGNOSIS — D3502 Benign neoplasm of left adrenal gland: Secondary | ICD-10-CM | POA: Diagnosis not present

## 2023-11-20 DIAGNOSIS — Z79811 Long term (current) use of aromatase inhibitors: Secondary | ICD-10-CM | POA: Diagnosis not present

## 2023-11-20 DIAGNOSIS — Z79899 Other long term (current) drug therapy: Secondary | ICD-10-CM | POA: Diagnosis not present

## 2023-11-20 DIAGNOSIS — R197 Diarrhea, unspecified: Secondary | ICD-10-CM | POA: Insufficient documentation

## 2023-11-20 DIAGNOSIS — C50919 Malignant neoplasm of unspecified site of unspecified female breast: Secondary | ICD-10-CM | POA: Insufficient documentation

## 2023-11-20 DIAGNOSIS — Z881 Allergy status to other antibiotic agents status: Secondary | ICD-10-CM | POA: Diagnosis not present

## 2023-11-20 LAB — CBC WITH DIFFERENTIAL (CANCER CENTER ONLY)
Abs Immature Granulocytes: 0.01 K/uL (ref 0.00–0.07)
Basophils Absolute: 0.1 K/uL (ref 0.0–0.1)
Basophils Relative: 1 %
Eosinophils Absolute: 0.1 K/uL (ref 0.0–0.5)
Eosinophils Relative: 2 %
HCT: 41.9 % (ref 36.0–46.0)
Hemoglobin: 14.1 g/dL (ref 12.0–15.0)
Immature Granulocytes: 0 %
Lymphocytes Relative: 11 %
Lymphs Abs: 0.7 K/uL (ref 0.7–4.0)
MCH: 37.9 pg — ABNORMAL HIGH (ref 26.0–34.0)
MCHC: 33.7 g/dL (ref 30.0–36.0)
MCV: 112.6 fL — ABNORMAL HIGH (ref 80.0–100.0)
Monocytes Absolute: 0.4 K/uL (ref 0.1–1.0)
Monocytes Relative: 7 %
Neutro Abs: 5.1 K/uL (ref 1.7–7.7)
Neutrophils Relative %: 79 %
Platelet Count: 188 K/uL (ref 150–400)
RBC: 3.72 MIL/uL — ABNORMAL LOW (ref 3.87–5.11)
RDW: 14.1 % (ref 11.5–15.5)
WBC Count: 6.5 K/uL (ref 4.0–10.5)
nRBC: 0 % (ref 0.0–0.2)

## 2023-11-20 LAB — CMP (CANCER CENTER ONLY)
ALT: 31 U/L (ref 0–44)
AST: 52 U/L — ABNORMAL HIGH (ref 15–41)
Albumin: 3.9 g/dL (ref 3.5–5.0)
Alkaline Phosphatase: 160 U/L — ABNORMAL HIGH (ref 38–126)
Anion gap: 7 (ref 5–15)
BUN: 39 mg/dL — ABNORMAL HIGH (ref 8–23)
CO2: 30 mmol/L (ref 22–32)
Calcium: 11.2 mg/dL — ABNORMAL HIGH (ref 8.9–10.3)
Chloride: 98 mmol/L (ref 98–111)
Creatinine: 1.38 mg/dL — ABNORMAL HIGH (ref 0.44–1.00)
GFR, Estimated: 37 mL/min — ABNORMAL LOW (ref >60)
Glucose, Bld: 124 mg/dL — ABNORMAL HIGH (ref 70–99)
Potassium: 4.2 mmol/L (ref 3.5–5.1)
Sodium: 135 mmol/L (ref 135–145)
Total Bilirubin: 0.6 mg/dL (ref 0.0–1.2)
Total Protein: 7.7 g/dL (ref 6.5–8.1)

## 2023-11-20 MED ORDER — IOHEXOL 9 MG/ML PO SOLN
500.0000 mL | ORAL | Status: AC
  Administered 2023-11-20 (×2): 500 mL via ORAL

## 2023-11-20 MED ORDER — IOHEXOL 300 MG/ML  SOLN
80.0000 mL | Freq: Once | INTRAMUSCULAR | Status: AC | PRN
  Administered 2023-11-20: 80 mL via INTRAVENOUS

## 2023-11-25 DIAGNOSIS — H34831 Tributary (branch) retinal vein occlusion, right eye, with macular edema: Secondary | ICD-10-CM | POA: Diagnosis not present

## 2023-11-25 DIAGNOSIS — Z681 Body mass index (BMI) 19 or less, adult: Secondary | ICD-10-CM | POA: Diagnosis not present

## 2023-11-25 DIAGNOSIS — N390 Urinary tract infection, site not specified: Secondary | ICD-10-CM | POA: Diagnosis not present

## 2023-11-26 ENCOUNTER — Ambulatory Visit

## 2023-11-26 ENCOUNTER — Inpatient Hospital Stay: Admitting: Hematology and Oncology

## 2023-11-26 VITALS — BP 125/58 | HR 68 | Temp 97.1°F | Resp 17 | Wt 84.1 lb

## 2023-11-26 DIAGNOSIS — C50919 Malignant neoplasm of unspecified site of unspecified female breast: Secondary | ICD-10-CM

## 2023-11-26 DIAGNOSIS — Z170 Estrogen receptor positive status [ER+]: Secondary | ICD-10-CM

## 2023-11-26 DIAGNOSIS — C50411 Malignant neoplasm of upper-outer quadrant of right female breast: Secondary | ICD-10-CM | POA: Diagnosis not present

## 2023-11-26 DIAGNOSIS — C7951 Secondary malignant neoplasm of bone: Secondary | ICD-10-CM | POA: Diagnosis not present

## 2023-11-26 MED ORDER — DENOSUMAB 120 MG/1.7ML ~~LOC~~ SOLN
120.0000 mg | Freq: Once | SUBCUTANEOUS | Status: AC
  Administered 2023-11-26: 120 mg via SUBCUTANEOUS
  Filled 2023-11-26: qty 1.7

## 2023-11-26 NOTE — Progress Notes (Signed)
 Patient Care Team: Cleotilde Planas, MD as PCP - General (Family Medicine) Ladona Heinz, MD as PCP - Cardiology (Cardiology) Odean Potts, MD as Consulting Physician (Hematology and Oncology) Dewey Rush, MD as Consulting Physician (Radiation Oncology) Curvin Deward MOULD, MD as Consulting Physician (General Surgery) Tyree Nanetta SAILOR, RN as Oncology Nurse Navigator Lucila Rush LABOR, RPH-CPP as Pharmacist (Hematology and Oncology)  DIAGNOSIS:  Encounter Diagnosis  Name Primary?   Malignant neoplasm of upper-outer quadrant of right breast in female, estrogen receptor positive (HCC) Yes    SUMMARY OF ONCOLOGIC HISTORY: Oncology History  Malignant neoplasm of upper-outer quadrant of right breast in female, estrogen receptor positive (HCC)  07/01/2022 Initial Diagnosis   Right breast periareolar hardening and redness at 10 o'clock position measuring 1.7 cm with skin thickening extending to the nipple and an additional area 0.9 cm contiguous with the skin axilla, negative biopsy revealed grade 2 ILC ER 95% PR 20% Ki67 10%, HER2 negative 1+ by IHC.  Left breast calcifications retroareolar 9 o'clock position 2.6 cm biopsy was discordant excision recommended.   07/16/2022 Cancer Staging   Staging form: Breast, AJCC 8th Edition - Clinical: Stage IIIB (cT4, cN0, cM0, G2, ER+, PR+, HER2-) - Signed by Odean Potts, MD on 07/16/2022 Stage prefix: Initial diagnosis Histologic grading system: 3 grade system   07/23/2022 Genetic Testing   Negative [facility] Multi-Cancer + RNA Panel. 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 Report date is July 23, 2022.  The Multi-Cancer + RNA Panel offered by [facility] includes sequencing and/or deletion/duplication analysis of the following 70 genes:  AIP*, ALK, APC*, ATM*, AXIN2*, BAP1*, BARD1*, BLM*, BMPR1A*, BRCA1*, BRCA2*, BRIP1*, CDC73*, CDH1*, CDK4, CDKN1B*, CDKN2A, CHEK2*, CTNNA1*, DICER1*, EPCAM (del/dup only), EGFR, FH*, FLCN*, GREM1 (promoter dup only), HOXB13,  KIT, LZTR1, MAX*, MBD4, MEN1*, MET, MITF, MLH1*, MSH2*, MSH3*, MSH6*, MUTYH*, NF1*, NF2*, NTHL1*, PALB2*, PDGFRA, PMS2*, POLD1*, POLE*, POT1*, PRKAR1A*, PTCH1*, PTEN*, RAD51C*, RAD51D*, RB1*, RET, SDHA* (sequencing only), SDHAF2*, SDHB*, SDHC*, SDHD*, SMAD4*, SMARCA4*, SMARCB1*, SMARCE1*, STK11*, SUFU*, TMEM127*, TP53*, TSC1*, TSC2*, VHL*. RNA analysis is performed for * genes.     CHIEF COMPLIANT: Follow-up of metastatic breast cancer after recent scans  HISTORY OF PRESENT ILLNESS:  History of Present Illness Madison Richards is an 89 year old female with metastatic breast cancer who presents for follow-up after a CT scan.  The recent CT scan shows stable bone metastases with no new lesions and no lymph node or soft tissue involvement in the chest, abdomen, or pelvis. She feels well since the pleural effusion was resolved.  She developed a urinary tract infection this week and started antibiotics yesterday.  She is currently taking Rosin without adverse effects. She has not experienced diarrhea in the past two to three months and maintains adequate hydration. Blood work indicates normal white blood cell count, improved hemoglobin levels, and normal platelet count. Kidney function is suboptimal, calcium levels are elevated, and liver enzymes are slightly elevated, though she remains asymptomatic.     ALLERGIES:  is allergic to bacitracin-polymyxin b, fosamax [alendronate sodium], simvastatin, clindamycin/lincomycin, neosporin [neomycin-polymyxin-gramicidin], and penicillins.  MEDICATIONS:  Current Outpatient Medications  Medication Sig Dispense Refill   abemaciclib  (VERZENIO ) 50 MG tablet Take 1 tablet (50 mg total) by mouth 2 (two) times daily. 56 tablet 5   acetaminophen  (TYLENOL ) 500 MG tablet Take 500 mg by mouth every 6 (six) hours as needed for moderate pain (pain score 4-6). ]     albuterol  (VENTOLIN  HFA) 108 (90 Base) MCG/ACT inhaler Inhale 2 puffs into  the lungs every 6 (six) hours  as needed for wheezing or shortness of breath. 51 each 6   apixaban  (ELIQUIS ) 2.5 MG TABS tablet Take 1 tablet (2.5 mg total) by mouth 2 (two) times daily. 180 tablet 3   Calcium Carbonate-Vitamin D  (CALCIUM 600 + D PO) Take 1 tablet by mouth 2 (two) times daily.     Cholecalciferol  (VITAMIN D3) 5000 UNITS TABS Take 1 tablet by mouth every Monday, Wednesday, and Friday. (Patient not taking: Reported on 11/10/2023)     Coenzyme Q10 (COQ10 PO) Take 200 mg by mouth.     colestipol  (COLESTID ) 1 g tablet Take 2 g by mouth daily.     dapagliflozin  propanediol (FARXIGA ) 10 MG TABS tablet Take 1 tablet (10 mg total) by mouth daily. 30 tablet 0   denosumab  (XGEVA ) 120 MG/1.7ML SOLN injection Inject 120 mg into the skin every 3 (three) months.     estradiol  (ESTRACE  VAGINAL) 0.1 MG/GM vaginal cream Place 1 Applicatorful vaginally 2 (two) times a week. (Patient taking differently: Place 1 Applicatorful vaginally as needed.) 42.5 g 1   ezetimibe  (ZETIA ) 10 MG tablet Take 1 tablet (10 mg total) by mouth daily. 90 tablet 3   feeding supplement (ENSURE PLUS HIGH PROTEIN) LIQD Take 237 mLs by mouth 3 (three) times daily between meals. (Patient taking differently: Take 237 mLs by mouth 2 (two) times daily between meals.) 21330 mL 0   fexofenadine (ALLEGRA) 180 MG tablet Take 180 mg by mouth daily as needed for allergies.     furosemide  (LASIX ) 40 MG tablet Take 1 tablet (40 mg total) by mouth daily. 90 tablet 3   ipratropium-albuterol  (DUONEB) 0.5-2.5 (3) MG/3ML SOLN Take 3 mLs by nebulization every 6 (six) hours as needed (shortness of breath and wheezing). 360 mL 0   letrozole  (FEMARA ) 2.5 MG tablet TAKE 1 TABLET BY MOUTH EVERY DAY 90 tablet 3   metoprolol  tartrate (LOPRESSOR ) 25 MG tablet TAKE 1 TABLET BY MOUTH TWICE A DAY 180 tablet 3   Multiple Vitamin (MULTIVITAMIN WITH MINERALS) TABS Take 1 tablet by mouth daily.     pravastatin  (PRAVACHOL ) 80 MG tablet Take 1 tablet (80 mg total) by mouth at bedtime. 90  tablet 3   spironolactone  (ALDACTONE ) 25 MG tablet Take 1 tablet (25 mg total) by mouth daily. 90 tablet 3   umeclidinium-vilanterol (ANORO ELLIPTA ) 62.5-25 MCG/ACT AEPB Inhale 1 puff into the lungs daily. 1 each 5   No current facility-administered medications for this visit.    PHYSICAL EXAMINATION: ECOG PERFORMANCE STATUS: 1 - Symptomatic but completely ambulatory  Vitals:   11/26/23 1058  BP: (!) 125/58  Pulse: 68  Resp: 17  Temp: (!) 97.1 F (36.2 C)  SpO2: 91%   Filed Weights   11/26/23 1058  Weight: 84 lb 2 oz (38.2 kg)    LABORATORY DATA:  I have reviewed the data as listed    Latest Ref Rng & Units 11/20/2023    9:56 AM 10/29/2023    9:35 AM 09/28/2023    3:03 AM  CMP  Glucose 70 - 99 mg/dL 875  91  897   BUN 8 - 23 mg/dL 39  37  21   Creatinine 0.44 - 1.00 mg/dL 8.61  8.76  8.94   Sodium 135 - 145 mmol/L 135  141  135   Potassium 3.5 - 5.1 mmol/L 4.2  4.4  4.3   Chloride 98 - 111 mmol/L 98  102  96   CO2 22 -  32 mmol/L 30  33  31   Calcium 8.9 - 10.3 mg/dL 88.7  89.3  8.5   Total Protein 6.5 - 8.1 g/dL 7.7  7.1    Total Bilirubin 0.0 - 1.2 mg/dL 0.6  0.5    Alkaline Phos 38 - 126 U/L 160  154    AST 15 - 41 U/L 52  42    ALT 0 - 44 U/L 31  25      Lab Results  Component Value Date   WBC 6.5 11/20/2023   HGB 14.1 11/20/2023   HCT 41.9 11/20/2023   MCV 112.6 (H) 11/20/2023   PLT 188 11/20/2023   NEUTROABS 5.1 11/20/2023    ASSESSMENT & PLAN:  Malignant neoplasm of upper-outer quadrant of right breast in female, estrogen receptor positive (HCC) 07/01/2022:Right breast periareolar hardening and redness at 10 o'clock position measuring 1.7 cm with skin thickening extending to the nipple and an additional area 0.9 cm contiguous with the skin axilla, negative biopsy revealed grade 2 ILC ER 95% PR 20% Ki67 10%, HER2 negative 1+ by IHC.  Left breast calcifications retroareolar 9 o'clock position 2.6 cm biopsy was discordant excision recommended.    07/28/2022:  CT CAP: Widespread bone metastases throughout the visualized bone structures, 6 mm right upper lobe pulm nodule, bilateral adrenal adenomas, 2.7 cm aortic aneurysm, emphysema 07/28/2022: Bone scan: Diffuse osseous metastatic disease ------------------------------------------------------------------------------------------------------------------------------------------------ Current treatment: Verzinio (started 08/06/2022) with letrozole  and Xgeva  every 3 months Verzinio toxicities: Very occasional diarrhea Slight fatigue Brittle nails Overall she tolerates Verzinio fairly well. She thinks that this tumor on the breast is shrinking significantly.   Vaginal dryness and UTIs: On estrogen cream   CT CAP 11/03/2022: Stable bone metastases.  Stable 6 mm right upper lobe nodule, stable left adrenal nodule, possible cystitis CT CAP 05/22/2023: Stable bone metastasis, stable 6 mm right upper lobe lung nodule unchanged bilateral adrenal nodules benign improved bladder wall thickening CT CAP 11/23/2023 unchanged bone metastasis.  No progression, no pleural effusion emphysema   Leg swelling: 05/2023: No DVT. Return to clinic in 3 months with labs injection and follow-up. Continue with the current treatment and recheck scans in 6 months.  Assessment & Plan Metastatic breast cancer with bone metastases CT scan shows stable bone metastases, no new lesions, and no lymph node or soft tissue involvement. Breast tumor stability assumed. She tolerates current medication regimen well. - Continue Abemaciclib  and Letrozole . - Perform CT scans every six months. - Conduct blood tests in three months. - Administer Denosumab  injection today.  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 Denosumab  expected to lower levels. Advised to stop calcium supplements. - Administer Denosumab  injection. - Discontinue additional calcium supplements.  Chronic kidney disease,  unspecified stage Kidney function suboptimal, unspecified stage. No immediate management changes indicated. - Monitor kidney function with blood tests every three months.      No orders of the defined types were placed in this encounter.  The patient has a good understanding of the overall plan. she agrees with it. she will call with any problems that may develop before the next visit here.  I personally spent a total of 30 minutes in the care of the patient today including preparing to see the patient, getting/reviewing separately obtained history, performing a medically appropriate exam/evaluation, counseling and educating, placing orders, referring and communicating with other health care professionals, documenting clinical information in the EHR, independently interpreting results, communicating results, and coordinating care.   Viinay K  Odean, MD 11/26/23

## 2023-11-26 NOTE — Assessment & Plan Note (Signed)
 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  Left breast calcifications retroareolar 9 o'clock position 2.6 cm biopsy was discordant excision recommended.    07/28/2022: CT CAP: Widespread bone metastases throughout the visualized bone structures, 6 mm right upper lobe pulm nodule, bilateral adrenal adenomas, 2.7 cm aortic aneurysm, emphysema 07/28/2022: Bone scan: Diffuse osseous metastatic disease ------------------------------------------------------------------------------------------------------------------------------------------------ Current treatment: Verzinio (started 08/06/2022) with letrozole  and Xgeva  every 3 months Verzinio toxicities: Very occasional diarrhea Slight fatigue Brittle nails Overall she tolerates Verzinio fairly well. She thinks that this tumor on the breast is shrinking significantly.   Vaginal dryness and UTIs: On estrogen cream   CT CAP 11/03/2022: Stable bone metastases.  Stable 6 mm right upper lobe nodule, stable left adrenal nodule, possible cystitis CT CAP 05/22/2023: Stable bone metastasis, stable 6 mm right upper lobe lung nodule unchanged bilateral adrenal nodules benign improved bladder wall thickening CT CAP 11/23/2023 unchanged bone metastasis.  No progression, no pleural effusion emphysema   Leg swelling: 05/2023: No DVT. Continue with the current treatment and recheck scans in 6 months.

## 2023-11-27 ENCOUNTER — Telehealth: Payer: Self-pay | Admitting: Cardiology

## 2023-11-27 MED ORDER — DAPAGLIFLOZIN PROPANEDIOL 10 MG PO TABS: 10.0000 mg | ORAL_TABLET | Freq: Every day | ORAL | 3 refills | Status: AC

## 2023-11-27 NOTE — Telephone Encounter (Signed)
 Pt's medication was sent to pt's pharmacy as requested. Confirmation received.

## 2023-11-27 NOTE — Telephone Encounter (Signed)
*  STAT* If patient is at the pharmacy, call can be transferred to refill team.   1. Which medications need to be refilled? (please list name of each medication and dose if known)   dapagliflozin  propanediol (FARXIGA ) 10 MG TABS tablet   2. Would you like to learn more about the convenience, safety, & potential cost savings by using the [facility]?   3. Are you open to using the [facility] (Type [facility]. ).  4. Which pharmacy/location (including street and city if local pharmacy) is medication to be sent to?  [id number] - [place],  - [address]   5. Do they need a 30 day or 90 day supply?   90 day  Patient stated she is almost out of this medication.

## 2023-11-29 DIAGNOSIS — I5033 Acute on chronic diastolic (congestive) heart failure: Secondary | ICD-10-CM | POA: Diagnosis not present

## 2023-12-01 ENCOUNTER — Other Ambulatory Visit: Payer: Self-pay

## 2023-12-01 ENCOUNTER — Other Ambulatory Visit (HOSPITAL_COMMUNITY): Payer: Self-pay

## 2023-12-01 NOTE — Progress Notes (Signed)
 Clinical Intervention Note  Clinical Intervention Notes: Patient reported being diagnosed with a urinary tract infection and starting doxycyline. No DDIs identified with Verzenio    Clinical Intervention Outcomes: Prevention of an adverse drug event   Advertising account planner

## 2023-12-01 NOTE — Progress Notes (Signed)
 Specialty Pharmacy Refill Coordination Note  Madison Richards is a 89 y.o. female contacted today regarding refills of specialty medication(s) Abemaciclib  (VERZENIO )   Patient requested [name] at [facility] [place] date: 12/02/23   Medication will be filled on 12/01/23.

## 2023-12-03 ENCOUNTER — Other Ambulatory Visit (HOSPITAL_COMMUNITY): Payer: Self-pay

## 2023-12-15 ENCOUNTER — Ambulatory Visit: Admitting: General Practice

## 2023-12-16 NOTE — Progress Notes (Unsigned)
 Cardiology Clinic Note   Patient Name: Madison Richards Date of Encounter: 12/18/2023  Primary Care Provider:  Cleotilde Planas, MD Primary Cardiologist:  Gordy Bergamo, MD  Patient Profile    Madison Richards 89 year old female presents to the clinic today for follow-up evaluation of her chronic CHF with preserved EF.  Past Medical History    Past Medical History:  Diagnosis Date   Abdominal aortic aneurysm (AAA)    Acute ischemic stroke (HCC) 02/09/2018   Breast cancer (HCC) 06/2022   right   Generalized headaches    due to neck pain   GERD (gastroesophageal reflux disease)    Hyperlipidemia    Hypertension    Neck pain    Osteoporosis    Stroke [facility])    Past Surgical History:  Procedure Laterality Date   ABDOMINAL ANGIOGRAM Bilateral 03/02/2012   Procedure: ABDOMINAL ANGIOGRAM;  Surgeon: Erick JONELLE Bergamo, MD;  Location: [facility] CATH LAB;  Service: Cardiovascular;  Laterality: Bilateral;   ABDOMINAL ANGIOGRAM  04/06/2012   Procedure: ABDOMINAL ANGIOGRAM;  Surgeon: Erick JONELLE Bergamo, MD;  Location: [facility] CATH LAB;  Service: Cardiovascular;;   abdominal tumor     removed in 1968/benign   APPENDECTOMY     AUGMENTATION MAMMAPLASTY  2000   REMOVED in 2000   BREAST BIOPSY Right 07/01/2022   US  RT BREAST BX W LOC DEV 1ST LESION IMG BX SPEC US  GUIDE 07/01/2022 [facility]   BREAST BIOPSY Left 07/01/2022   US  LT BREAST BX W LOC DEV 1ST LESION IMG BX SPEC US  GUIDE 07/01/2022 [facility]   ILIAC ARTERY STENT     right side 2003 and 2014   IR THORACENTESIS ASP PLEURAL SPACE W/IMG GUIDE  09/29/2023    Allergies  Allergies  Allergen Reactions   Bacitracin-Polymyxin B Other (See Comments)    AREA APPLIED TO SPREAD   Fosamax [Alendronate Sodium] Other (See Comments)    Leg cramps   Simvastatin Other (See Comments)    leg pain   Clindamycin/Lincomycin Rash    Rash in mouth   Neosporin [Neomycin-Polymyxin-Gramicidin] Rash   Penicillins Rash    Rash in mouth    History of  Present Illness    Madison Richards has a PMH of acute on chronic HFpEF, paroxysmal atrial fibrillation, severe tricuspid regurgitation (echo 09/15/2023, HTN, HLD, CVA, carotid artery disease, PAD, breast CA, COPD, right common iliac aneurysm with stenting 04/06/2022, and 05/26/2014 (had thrombectomy event after stopping antiplatelet therapy and underwent thrombectomy).  She was seen in follow-up by Dr. Bergamo on 03/24/2023.  During that time she reported stable claudication, stable shortness of breath and occasional lower extremity swelling.  She was otherwise stable from a cardiac standpoint.  Her LDL was noted to be 106 (goal LDL less than 100) her pravastatin  was increased from 40 to 80 mg daily.  She was hospitalized 09/21/2023 until 12/25.  She noted worsening dyspnea and lower extremity edema.  She was admitted and diagnosed with acute heart failure exacerbation.  Echocardiogram showed an LVEF of 70-75%, mild LVH, right ventricular systolic function  was preserved.  Her amlodipine /valsartan  and HCTZ were discontinued.  She was discharged on Eliquis  2.5 mg twice daily, clopidogrel , Farxiga , ezetimibe , furosemide  40 mg daily, metoprolol  25 mg twice daily, pravastatin , and spironolactone .  Her PCP agreed with Dr. Bergamo and her Plavix  was discontinued on 10/01/2023.  She was continued on Eliquis  in the setting of paroxysmal atrial fibrillation.  She was seen in follow-up by Sheron RIGGERS on 10/15/2023.  During that time she noted significant improvement in her shortness of breath and lower extremity swelling since being in the hospital.  She reported ongoing orthopnea x 1 month and was sleeping with 2 pillows.  She had been on oxygen since leaving the hospital.  She was following with her PCP.  They had reduced oxygen from 5 to 4 L without concern.  She denied chest pain, palpitations, dizziness, syncope, bleeding issues, and claudication.  Follow-up in 2 months was planned.  She presents to the clinic today for  follow-up evaluation and states she continues to do well.  She reports that she is feeling much better.  She has now weaned off of her oxygen.  Her oxygen saturation in the clinic today on room air is 94%.  We reviewed her previous clinic visit.  She expressed understanding.  She does note that she had a UTI about 2 weeks ago.  She received antibiotics for this.  She did note some blood in her urine at that time.  We reviewed her severe tricuspid valve regurgitation.  She wishes to manage this conservatively with medication.  She notes that she had an echocardiogram today.  This was ordered by pulmonology.  I will have her continue to increase the calories in her diet, continue her current medication regimen and plan follow-up in 4 to 6 months.  Today she denies chest pain, shortness of breath, lower extremity edema, fatigue, palpitations, melena, hematuria, hemoptysis, diaphoresis, weakness, presyncope, syncope, orthopnea, and PND.     Home Medications    Prior to Admission medications   Medication Sig Start Date End Date Taking? Authorizing Provider  abemaciclib  (VERZENIO ) 50 MG tablet Take 1 tablet (50 mg total) by mouth 2 (two) times daily. 11/02/23   Gudena, Vinay, MD  acetaminophen  (TYLENOL ) 500 MG tablet Take 500 mg by mouth every 6 (six) hours as needed for moderate pain (pain score 4-6). ]    [provider]  albuterol  (VENTOLIN  HFA) 108 (90 Base) MCG/ACT inhaler Inhale 2 puffs into the lungs every 6 (six) hours as needed for wheezing or shortness of breath. 11/10/23   Theodoro Lakes, MD  apixaban  (ELIQUIS ) 2.5 MG TABS tablet Take 1 tablet (2.5 mg total) by mouth 2 (two) times daily. 10/15/23   Sheron Lorette GRADE, PA-C  Calcium Carbonate-Vitamin D  (CALCIUM 600 + D PO) Take 1 tablet by mouth 2 (two) times daily.    [provider]  Cholecalciferol  (VITAMIN D3) 5000 UNITS TABS Take 1 tablet by mouth every Monday, Wednesday, and Friday. Patient not taking: Reported on 11/10/2023     [provider]  Coenzyme Q10 (COQ10 PO) Take 200 mg by mouth.    [provider]  colestipol  (COLESTID ) 1 g tablet Take 2 g by mouth daily.    [provider]  dapagliflozin  propanediol (FARXIGA ) 10 MG TABS tablet Take 1 tablet (10 mg total) by mouth daily. 11/27/23   Sheron Lorette GRADE, PA-C  denosumab  (XGEVA ) 120 MG/1.7ML SOLN injection Inject 120 mg into the skin every 3 (three) months.    [provider]  estradiol  (ESTRACE  VAGINAL) 0.1 MG/GM vaginal cream Place 1 Applicatorful vaginally 2 (two) times a week. Patient taking differently: Place 1 Applicatorful vaginally as needed. 02/23/23   Gudena, Vinay, MD  ezetimibe  (ZETIA ) 10 MG tablet Take 1 tablet (10 mg total) by mouth daily. 10/15/23   Sheron Lorette GRADE, PA-C  feeding supplement (ENSURE PLUS HIGH PROTEIN) LIQD Take 237 mLs by mouth 3 (three) times daily between  meals. Patient taking differently: Take 237 mLs by mouth 2 (two) times daily between meals. 09/29/23   Arrien, Mauricio Daniel, MD  fexofenadine (ALLEGRA) 180 MG tablet Take 180 mg by mouth daily as needed for allergies.    [provider]  furosemide  (LASIX ) 40 MG tablet Take 1 tablet (40 mg total) by mouth daily. 10/15/23   Sheron Lorette GRADE, PA-C  ipratropium-albuterol  (DUONEB) 0.5-2.5 (3) MG/3ML SOLN Take 3 mLs by nebulization every 6 (six) hours as needed (shortness of breath and wheezing). 09/29/23 11/10/23  Arrien, Mauricio Daniel, MD  letrozole  (FEMARA ) 2.5 MG tablet TAKE 1 TABLET BY MOUTH EVERY DAY 07/27/23   Gudena, Vinay, MD  metoprolol  tartrate (LOPRESSOR ) 25 MG tablet TAKE 1 TABLET BY MOUTH TWICE A DAY 12/29/22   Ladona Heinz, MD  Multiple Vitamin (MULTIVITAMIN WITH MINERALS) TABS Take 1 tablet by mouth daily.    [provider]  pravastatin  (PRAVACHOL ) 80 MG tablet Take 1 tablet (80 mg total) by mouth at bedtime. 03/24/23   Ladona Heinz, MD  spironolactone  (ALDACTONE ) 25 MG tablet Take 1 tablet (25 mg total) by mouth daily.  10/15/23   Sheron Lorette GRADE, PA-C  umeclidinium-vilanterol (ANORO ELLIPTA ) 62.5-25 MCG/ACT AEPB Inhale 1 puff into the lungs daily. 11/10/23   Theodoro Lakes, MD    Family History    Family History  Problem Relation Age of Onset   Heart disease Sister    Heart disease Brother    Heart disease Brother    Throat cancer Brother 89       SCC   Breast cancer Niece 30   Ovarian cancer Niece        dx 73s   Colon cancer Neg Hx    She indicated that her mother is deceased. She indicated that her father is deceased. She indicated that her sister is alive. She indicated that only one of her three brothers is alive. She indicated that the status of her neg hx is unknown. She indicated that both of her nieces are alive.  Social History    Social History   Socioeconomic History   Marital status: Widowed    Spouse name: Not on file   Number of children: 2   Years of education: Not on file   Highest education level: Not on file  Occupational History   Not on file  Tobacco Use   Smoking status: Former    Current packs/day: 0.00    Types: Cigarettes    Quit date: 02/07/2018    Years since quitting: 5.8   Smokeless tobacco: Never   Tobacco comments:    Smoked for about 60 years, about 3/4 ppd.  Vaping Use   Vaping status: Never Used  Substance and Sexual Activity   Alcohol use: Yes    Alcohol/week: 1.0 standard drink of alcohol    Types: 1 Glasses of wine per week    Comment: daily   Drug use: No   Sexual activity: Not on file  Other Topics Concern   Not on file  Social History Narrative   Not on file   Social Drivers of Health   Financial Resource Strain: Not on file  Food Insecurity: No Food Insecurity (09/21/2023)   Hunger Vital Sign    Worried About Running Out of Food in the Last Year: Never true    Ran Out of Food in the Last Year: Never true  Transportation Needs: No Transportation Needs (09/21/2023)   PRAPARE - Transportation    Lack of Transportation (  Medical): No     Lack of Transportation (Non-Medical): No  Physical Activity: Not on file  Stress: Not on file  Social Connections: Unknown (09/21/2023)   Social Connection and Isolation Panel    Frequency of Communication with Friends and Family: More than three times a week    Frequency of Social Gatherings with Friends and Family: More than three times a week    Attends Religious Services: Not on file    Active Member of Clubs or Organizations: Yes    Attends Banker Meetings: More than 4 times per year    Marital Status: Widowed  Intimate Partner Violence: Patient Declined (09/21/2023)   Humiliation, Afraid, Rape, and Kick questionnaire    Fear of Current or Ex-Partner: Patient declined    Emotionally Abused: Patient declined    Physically Abused: Patient declined    Sexually Abused: Patient declined     Review of Systems    General:  No chills, fever, night sweats or weight changes.  Cardiovascular:  No chest pain, dyspnea on exertion, edema, orthopnea, palpitations, paroxysmal nocturnal dyspnea. Dermatological: No rash, lesions/masses Respiratory: No cough, dyspnea Urologic: No hematuria, dysuria Abdominal:   No nausea, vomiting, diarrhea, bright red blood per rectum, melena, or hematemesis Neurologic:  No visual changes, wkns, changes in mental status. All other systems reviewed and are otherwise negative except as noted above.  Physical Exam    VS:  BP 136/62   Pulse 73   Ht 5' 3 (1.6 m)   Wt 80 lb (36.3 kg)   SpO2 94%   BMI 14.17 kg/m  , BMI Body mass index is 14.17 kg/m. GEN: Well nourished, well developed, in no acute distress. HEENT: normal. Neck: Supple, no JVD, carotid bruits, or masses. Cardiac: RRR, 3/6 systolic murmur head at apex, rubs, or gallops. No clubbing, cyanosis, edema.  Radials/DP/PT 2+ and equal bilaterally.  Respiratory:  Respirations regular and unlabored, clear to auscultation bilaterally. GI: Soft, nontender, nondistended, BS + x 4. MS: no  deformity or atrophy. Skin: warm and dry, no rash. Neuro:  Strength and sensation are intact. Psych: Normal affect.  Accessory Clinical Findings    Recent Labs: 09/21/2023: B Natriuretic Peptide 351.5; TSH 3.798 09/26/2023: Magnesium  2.2 11/20/2023: ALT 31; BUN 39; Creatinine 1.38; Hemoglobin 14.1; Platelet Count 188; Potassium 4.2; Sodium 135   Recent Lipid Panel    Component Value Date/Time   CHOL 172 02/09/2018 0950   TRIG 74 02/09/2018 0950   HDL 77 02/09/2018 0950   CHOLHDL 2.2 02/09/2018 0950   VLDL 15 02/09/2018 0950   LDLCALC 80 02/09/2018 0950         ECG personally reviewed by me today-none today.    Echocardiogram 09/15/2023   IMPRESSIONS     1. Left ventricular ejection fraction, by estimation, is 70 to 75%. Left  ventricular ejection fraction by 2D MOD biplane is 73.9 %. Left  ventricular ejection fraction by PLAX is 64 %. The left ventricle has  hyperdynamic function. The left ventricle has  no regional wall motion abnormalities. There is mild concentric left  ventricular hypertrophy. Left ventricular diastolic parameters are  consistent with Grade I diastolic dysfunction (impaired relaxation). The  average left ventricular global longitudinal  strain is -24.0 %. The global longitudinal strain is normal.   2. Right ventricular systolic function is normal. The right ventricular  size is moderately enlarged. There is severely elevated pulmonary artery  systolic pressure. The estimated right ventricular systolic pressure is  62.6 mmHg.  3. Left atrial size was mildly dilated.   4. Right atrial size was moderately dilated.   5. Large pleural effusion in the left lateral region.   6. The mitral valve is degenerative. Mild mitral valve regurgitation. No  evidence of mitral stenosis. Moderate mitral annular calcification.   7. Multiple TR jets. Tricuspid valve regurgitation is severe.   8. The aortic valve is tricuspid. There is moderate calcification of the   aortic valve. Aortic valve regurgitation is not visualized. Aortic valve  sclerosis/calcification is present, without any evidence of aortic  stenosis.   9. The inferior vena cava is dilated in size with <50% respiratory  variability, suggesting right atrial pressure of 15 mmHg.   FINDINGS   Left Ventricle: Left ventricular ejection fraction, by estimation, is 70  to 75%. Left ventricular ejection fraction by PLAX is 64 %. Left  ventricular ejection fraction by 2D MOD biplane is 73.9 %. The left  ventricle has hyperdynamic function. The left  ventricle has no regional wall motion abnormalities. The average left  ventricular global longitudinal strain is -24.0 %. Strain was performed  and the global longitudinal strain is normal. The left ventricular  internal cavity size was small. There is mild   concentric left ventricular hypertrophy. Left ventricular diastolic  parameters are consistent with Grade I diastolic dysfunction (impaired  relaxation).   Right Ventricle: The right ventricular size is moderately enlarged. No  increase in right ventricular wall thickness. Right ventricular systolic  function is normal. There is severely elevated pulmonary artery systolic  pressure. The tricuspid regurgitant  velocity is 3.45 m/s, and with an assumed right atrial pressure of 15  mmHg, the estimated right ventricular systolic pressure is 62.6 mmHg.   Left Atrium: Left atrial size was mildly dilated.   Right Atrium: Right atrial size was moderately dilated.   Pericardium: There is no evidence of pericardial effusion.   Mitral Valve: The mitral valve is degenerative in appearance. Moderate  mitral annular calcification. Mild mitral valve regurgitation. No evidence  of mitral valve stenosis. MV peak gradient, 7.4 mmHg. The mean mitral  valve gradient is 3.0 mmHg.   Tricuspid Valve: Multiple TR jets. The tricuspid valve is grossly normal.  Tricuspid valve regurgitation is severe. No  evidence of tricuspid  stenosis.   Aortic Valve: The aortic valve is tricuspid. There is moderate  calcification of the aortic valve. Aortic valve regurgitation is not  visualized. Aortic valve sclerosis/calcification is present, without any  evidence of aortic stenosis.   Pulmonic Valve: The pulmonic valve was normal in structure. Pulmonic valve  regurgitation is mild to moderate. No evidence of pulmonic stenosis.   Aorta: The aortic root and ascending aorta are structurally normal, with  no evidence of dilitation.   Venous: The inferior vena cava is dilated in size with less than 50%  respiratory variability, suggesting right atrial pressure of 15 mmHg.   IAS/Shunts: No atrial level shunt detected by color flow Doppler.      Assessment & Plan   1.  HFpEF-euvolemic today.  No increased DOE or activity intolerance.  Weight today 80, stable at home. Continue Farxiga , furosemide , metoprolol , spironolactone  Heart healthy low-sodium diet Daily weights Maintain physical activity Lower extremity support stockings  Essential hypertension-BP today 136/62. Maintain blood pressure log Continue metoprolol , spironolactone  Low-sodium diet  Paroxysmal atrial fibrillation-heart rate today 73.  Denies recent episodes of accelerated or irregular heartbeat.  Reports compliance with apixaban .  Denies bleeding issues. Continue metoprolol , apixaban  (reduced dose due to age  and weight) Avoid triggers caffeine, chocolate, EtOH, dehydration excetra.  Hyperlipidemia-LDL 80 on9/17/24. High-fiber diet Continue ezetimibe , pravastatin  Follow with PCP  Disposition: Follow-up with Dr. Ladona or me in 4 to 6 months.   Josefa HERO. Ishia Tenorio NP-C     12/18/2023, 3:34 PM [facility] [address] [place], [place] [address] Office [phone]    Notice: This dictation was prepared with Dragon dictation along with smaller phrase technology. Any transcriptional  errors that result from this process are unintentional and may not be corrected upon review.   I spent 14 minutes examining this patient, reviewing medications, and using patient centered shared decision making involving their cardiac care.   I spent  20 minutes reviewing past medical history,  medications, and prior cardiac tests.

## 2023-12-18 ENCOUNTER — Ambulatory Visit: Admitting: General Practice

## 2023-12-18 ENCOUNTER — Ambulatory Visit (HOSPITAL_COMMUNITY)
Admission: RE | Admit: 2023-12-18 | Discharge: 2023-12-18 | Disposition: A | Discharge status: Home / Self Care | Source: Ambulatory Visit

## 2023-12-18 ENCOUNTER — Encounter: Payer: Self-pay | Admitting: General Practice

## 2023-12-18 VITALS — BP 136/62 | HR 73 | Ht 63.0 in | Wt 80.0 lb

## 2023-12-18 DIAGNOSIS — E785 Hyperlipidemia, unspecified: Secondary | ICD-10-CM

## 2023-12-18 DIAGNOSIS — I272 Pulmonary hypertension, unspecified: Secondary | ICD-10-CM | POA: Insufficient documentation

## 2023-12-18 DIAGNOSIS — I10 Essential (primary) hypertension: Secondary | ICD-10-CM | POA: Insufficient documentation

## 2023-12-18 DIAGNOSIS — I5032 Chronic diastolic (congestive) heart failure: Secondary | ICD-10-CM | POA: Insufficient documentation

## 2023-12-18 DIAGNOSIS — I071 Rheumatic tricuspid insufficiency: Secondary | ICD-10-CM | POA: Insufficient documentation

## 2023-12-18 DIAGNOSIS — I480 Paroxysmal atrial fibrillation: Secondary | ICD-10-CM

## 2023-12-18 LAB — ECHOCARDIOGRAM COMPLETE
Area-P 1/2: 2.99 cm2
S' Lateral: 1.6 cm

## 2023-12-18 NOTE — Patient Instructions (Signed)
Medication Instructions:   Continue all current medications.   Labwork:  none  Testing/Procedures:  none  Follow-Up:  4-6 months   Any Other Special Instructions Will Be Listed Below (If Applicable).   If you need a refill on your cardiac medications before your next appointment, please call your pharmacy.

## 2023-12-24 ENCOUNTER — Other Ambulatory Visit: Payer: Self-pay

## 2023-12-28 ENCOUNTER — Other Ambulatory Visit: Payer: Self-pay

## 2023-12-28 ENCOUNTER — Other Ambulatory Visit: Payer: Self-pay | Admitting: Pharmacy Technician

## 2023-12-28 NOTE — Progress Notes (Signed)
 Specialty Pharmacy Refill Coordination Note  Madison Richards is a 89 y.o. female contacted today regarding refills of specialty medication(s) Abemaciclib  (VERZENIO )   Patient requested [name] at [facility] [place] date: 12/31/23   Medication will be filled on: 12/30/23

## 2023-12-29 ENCOUNTER — Other Ambulatory Visit: Payer: Self-pay

## 2024-01-01 ENCOUNTER — Other Ambulatory Visit (HOSPITAL_COMMUNITY): Payer: Self-pay

## 2024-01-20 DIAGNOSIS — R310 Gross hematuria: Secondary | ICD-10-CM | POA: Diagnosis not present

## 2024-01-20 DIAGNOSIS — Z681 Body mass index (BMI) 19 or less, adult: Secondary | ICD-10-CM | POA: Diagnosis not present

## 2024-01-22 ENCOUNTER — Other Ambulatory Visit (HOSPITAL_COMMUNITY): Payer: Self-pay

## 2024-01-23 ENCOUNTER — Encounter (INDEPENDENT_AMBULATORY_CARE_PROVIDER_SITE_OTHER): Payer: Self-pay

## 2024-01-25 ENCOUNTER — Other Ambulatory Visit (HOSPITAL_COMMUNITY): Payer: Self-pay

## 2024-01-25 ENCOUNTER — Other Ambulatory Visit: Payer: Self-pay

## 2024-01-25 NOTE — Progress Notes (Signed)
 Specialty Pharmacy Refill Coordination Note  MyChart Questionnaire Submission  Madison Richards is a 89 y.o. female contacted today regarding refills of specialty medication(s) Verzenio .  Doses on hand: 10 on 12/6    Patient requested: Pickup at [facility] date: 01/28/24  Medication will be filled on 01/27/24.

## 2024-01-26 ENCOUNTER — Other Ambulatory Visit: Payer: Self-pay

## 2024-01-27 ENCOUNTER — Ambulatory Visit

## 2024-01-27 ENCOUNTER — Other Ambulatory Visit: Payer: Self-pay

## 2024-01-27 VITALS — BP 102/60 | HR 73 | Temp 97.2°F | Ht 63.0 in | Wt 84.2 lb

## 2024-01-27 DIAGNOSIS — J431 Panlobular emphysema: Secondary | ICD-10-CM | POA: Diagnosis not present

## 2024-01-27 DIAGNOSIS — J301 Allergic rhinitis due to pollen: Secondary | ICD-10-CM | POA: Diagnosis not present

## 2024-01-27 DIAGNOSIS — J90 Pleural effusion, not elsewhere classified: Secondary | ICD-10-CM | POA: Diagnosis not present

## 2024-01-27 DIAGNOSIS — I361 Nonrheumatic tricuspid (valve) insufficiency: Secondary | ICD-10-CM

## 2024-01-27 DIAGNOSIS — I071 Rheumatic tricuspid insufficiency: Secondary | ICD-10-CM

## 2024-01-27 DIAGNOSIS — I5032 Chronic diastolic (congestive) heart failure: Secondary | ICD-10-CM | POA: Diagnosis not present

## 2024-01-27 DIAGNOSIS — I272 Pulmonary hypertension, unspecified: Secondary | ICD-10-CM | POA: Insufficient documentation

## 2024-01-27 MED ORDER — UMECLIDINIUM-VILANTEROL 62.5-25 MCG/ACT IN AEPB: 1.0000 | INHALATION_SPRAY | Freq: Every day | RESPIRATORY_TRACT | 5 refills | Status: DC

## 2024-01-27 MED ORDER — STIOLTO RESPIMAT 2.5-2.5 MCG/ACT IN AERS: 2.0000 | INHALATION_SPRAY | Freq: Every day | RESPIRATORY_TRACT | 10 refills | Status: AC

## 2024-01-27 NOTE — Addendum Note (Signed)
 Addended by: Glendi Mohiuddin D on: 01/27/2024 12:04 PM   Modules accepted: Orders

## 2024-01-27 NOTE — Assessment & Plan Note (Deleted)
 [name]

## 2024-01-27 NOTE — Patient Instructions (Addendum)
°  VISIT SUMMARY: During your visit, we discussed your confusion regarding your inhaler prescriptions and addressed your symptoms related to COPD and allergies. We reviewed your current medications and made some adjustments to help manage your conditions more effectively.  YOUR PLAN: -CHRONIC OBSTRUCTIVE PULMONARY DISEASE (COPD): COPD is a chronic lung condition that makes it hard to breathe. We have canceled and reordered your Stiolto inhaler at [facility]. Please use Stiolto puff daily and Albuterol  as needed for acute symptoms. Contact your pharmacy to stop sending medications. We will follow up in three months to assess how well the inhaler is working for you.  -ALLERGIC RHINITIS DUE TO POLLEN: Allergic rhinitis is an allergic reaction that causes nasal congestion and post-nasal drip. We recommend using Flonase  nasal spray, one to two sprays in each nostril daily, and saline nasal sprays for additional relief.  INSTRUCTIONS: Please schedule a follow-up appointment in three months to assess the efficacy of your inhaler. Contact your pharmacy to stop sending medications.                      Contains text generated by Abridge.                                 Contains text generated by Abridge.

## 2024-01-27 NOTE — Progress Notes (Addendum)
 Pulmonology Office Visit   Subjective:  Patient ID: Madison Richards, female    DOB: 18-Dec-1934  MRN: [id number]  Referred by: Theodoro Lakes, MD  CC:  Chief Complaint  Patient presents with   Follow-up    Post nasal drip and runny nose.  Would like to discuss medications    HPI Madison Richards is a 89 y.o. female with chronic diastolic heart failure, hypertension, PAD, paroxysmal A-fib, severe TR, COPD, breast cancer with mets on chemo Xgeva , w daily letrozole  and verzenio  .  Presenting for evaluation of hypoxia, COPD and concern for pulmonary hypertension.    Admission August 2025: Treated for heart failure exacerbation and COPD exacerbation.  Had right-sided thoracentesis with removal of 600 cc, transudate. Symptomatic improvement with fluid removal. DC on home O2.   11/10/23> prescribed Anoro Ellipta  and albuterol  for COPD.  In addition plan to repeat echocardiogram to evaluate pulmonary hypertension post diuresis.  Respective notes from provider reviewed as appropriate to gather relevant information for patient care.   Discussed the use of AI scribe software for clinical note transcription with the patient, who gave verbal consent to proceed.  History of Present Illness   Madison Richards is an 89 year old female with COPD who presents for a follow-up visit due to confusion regarding her inhaler prescriptions.  She is experiencing confusion with her inhaler prescriptions after receiving an inhaler from [facility] and a message from [facility] stating she could not fill her prescription until November 19th. This resulted in her receiving the same medication from two different pharmacies, leading to uncertainty about which inhaler to use.  She has been using Albuterol  as needed, approximately a dozen times since her last visit, but it does not provide relief.  There was confusion about the prescription process and pharmacy fulfillment.  Her breathing remains unchanged, with the ability to walk  two blocks at a time if she takes her time. She experiences a cough and phlegm, which she attributes to sinus drip and allergies. She takes Allegra for her allergies but does not use nasal sprays. She experiences nasal congestion but denies it affecting her sleep, although she wakes up to urinate frequently due to her diuretic medication.      Lung Health: Functional status: 2 blocks.  Covid vaccine: 3 Influenza vaccine: utd.  Pneumonococcal vaccine: last PNA vaccine many years ago.  Smoking: <1 pk x 60 years, quit 1 year ago.  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    Testing: Eosinophil normal.   CT chest August 2025: Reviewed by me: Shows underlying emphysematous changes with ground glass opacity in right upper lobe, bilateral effusion with right lower lobe opacity likely atelectasis.  No PE. CT scan 11/20/2023 Reviewed by me shows resolution of pleural effusion continues to show emphysematous changes and right lower lobe opacity likely atelectasis/scarring.   Echo: July 2025: EF 74%, mild LVH, grade 1 DD, severely elevated PASP, estimated RVSP 63, TR max 3.45 m/s, LA mildly dilated, RA moderately dilated, severe TR. Patient was significantly swollen at that time.  Echo 12/18/2023: EF 60-65%, grade 2 diastolic dysfunction.  Mild elevation in PASP with RVSP 44.  TR V-max 3.21.  RV mildly enlarged.  PFT 11/11/2023: Mild obstruction with FEV1 73% predicted.  No restriction.  Severely reduced DLCO [suspected secondary to worsening pulmonary hypertension due to fluid overload which now has improved].   Allergies: Bacitracin-polymyxin b, Fosamax [alendronate sodium], Mirtazapine, Simvastatin, Clindamycin/lincomycin, Neosporin [neomycin-polymyxin-gramicidin], and Penicillins  Current Outpatient Medications:    abemaciclib  (  VERZENIO ) 50 MG tablet, Take 1 tablet (50 mg total) by mouth 2 (two) times daily., Disp: 56 tablet, Rfl: 5   acetaminophen  (TYLENOL ) 500 MG tablet, Take 500 mg by mouth  every 6 (six) hours as needed for moderate pain (pain score 4-6). ], Disp: , Rfl:    albuterol  (VENTOLIN  HFA) 108 (90 Base) MCG/ACT inhaler, Inhale 2 puffs into the lungs every 6 (six) hours as needed for wheezing or shortness of breath., Disp: 51 each, Rfl: 6   apixaban  (ELIQUIS ) 2.5 MG TABS tablet, Take 1 tablet (2.5 mg total) by mouth 2 (two) times daily., Disp: 180 tablet, Rfl: 3   Calcium Carbonate-Vitamin D  (CALCIUM 600 + D PO), Take 1 tablet by mouth 2 (two) times daily., Disp: , Rfl:    Coenzyme Q10 (COQ10 PO), Take 200 mg by mouth., Disp: , Rfl:    colestipol  (COLESTID ) 1 g tablet, Take 2 g by mouth daily., Disp: , Rfl:    dapagliflozin  propanediol (FARXIGA ) 10 MG TABS tablet, Take 1 tablet (10 mg total) by mouth daily., Disp: 90 tablet, Rfl: 3   denosumab  (XGEVA ) 120 MG/1.7ML SOLN injection, Inject 120 mg into the skin every 3 (three) months., Disp: , Rfl:    estradiol  (ESTRACE  VAGINAL) 0.1 MG/GM vaginal cream, Place 1 Applicatorful vaginally 2 (two) times a week., Disp: 42.5 g, Rfl: 1   ezetimibe  (ZETIA ) 10 MG tablet, Take 1 tablet (10 mg total) by mouth daily., Disp: 90 tablet, Rfl: 3   feeding supplement (ENSURE PLUS HIGH PROTEIN) LIQD, Take 237 mLs by mouth 3 (three) times daily between meals., Disp: 21330 mL, Rfl: 0   fexofenadine (ALLEGRA) 180 MG tablet, Take 180 mg by mouth daily as needed for allergies., Disp: , Rfl:    furosemide  (LASIX ) 40 MG tablet, Take 1 tablet (40 mg total) by mouth daily., Disp: 90 tablet, Rfl: 3   letrozole  (FEMARA ) 2.5 MG tablet, TAKE 1 TABLET BY MOUTH EVERY DAY, Disp: 90 tablet, Rfl: 3   metoprolol  tartrate (LOPRESSOR ) 25 MG tablet, TAKE 1 TABLET BY MOUTH TWICE A DAY, Disp: 180 tablet, Rfl: 3   Multiple Vitamin (MULTIVITAMIN WITH MINERALS) TABS, Take 1 tablet by mouth daily., Disp: , Rfl:    pravastatin  (PRAVACHOL ) 80 MG tablet, Take 1 tablet (80 mg total) by mouth at bedtime., Disp: 90 tablet, Rfl: 3   spironolactone  (ALDACTONE ) 25 MG tablet, Take 1  tablet (25 mg total) by mouth daily., Disp: 90 tablet, Rfl: 3   ipratropium-albuterol  (DUONEB) 0.5-2.5 (3) MG/3ML SOLN, Take 3 mLs by nebulization every 6 (six) hours as needed (shortness of breath and wheezing). (Patient not taking: Reported on 01/27/2024), Disp: 360 mL, Rfl: 0   umeclidinium-vilanterol (ANORO ELLIPTA ) 62.5-25 MCG/ACT AEPB, Inhale 1 puff into the lungs daily., Disp: 1 each, Rfl: 5 Past Medical History:  Diagnosis Date   Abdominal aortic aneurysm (AAA)    Acute ischemic stroke (HCC) 02/09/2018   Breast cancer (HCC) 06/2022   right   Generalized headaches    due to neck pain   GERD (gastroesophageal reflux disease)    Hyperlipidemia    Hypertension    Neck pain    Osteoporosis    Stroke [facility])    Past Surgical History:  Procedure Laterality Date   ABDOMINAL ANGIOGRAM Bilateral 03/02/2012   Procedure: ABDOMINAL ANGIOGRAM;  Surgeon: Erick JONELLE Bergamo, MD;  Location: [facility] CATH LAB;  Service: Cardiovascular;  Laterality: Bilateral;   ABDOMINAL ANGIOGRAM  04/06/2012   Procedure: ABDOMINAL ANGIOGRAM;  Surgeon: Erick JONELLE Bergamo, MD;  Location: [facility] CATH LAB;  Service: Cardiovascular;;   abdominal tumor     removed in 1968/benign   APPENDECTOMY     AUGMENTATION MAMMAPLASTY  2000   REMOVED in 2000   BREAST BIOPSY Right 07/01/2022   US  RT BREAST BX W LOC DEV 1ST LESION IMG BX SPEC US  GUIDE 07/01/2022 [facility] M[facility]MO[facility]   BREAST BIOPSY Left 07/01/2022   US  LT BREAST BX W LOC DEV 1ST LESION IMG BX SPEC US  GUIDE 07/01/2022 [facility]   ILIAC ARTERY STENT     right side 2003 and 2014   IR THORACENTESIS ASP PLEURAL SPACE W/IMG GUIDE  09/29/2023   Family History  Problem Relation Age of Onset   Heart disease Sister    Heart disease Brother    Heart disease Brother    Throat cancer Brother 11       SCC   Breast cancer Niece 65   Ovarian cancer Niece        dx 8s   Colon cancer Neg Hx    Social History   Socioeconomic History   Marital status: Widowed    Spouse name:  Not on file   Number of children: 2   Years of education: Not on file   Highest education level: Not on file  Occupational History   Not on file  Tobacco Use   Smoking status: Former    Current packs/day: 0.00    Types: Cigarettes    Quit date: 02/07/2018    Years since quitting: 5.9   Smokeless tobacco: Never   Tobacco comments:    Smoked for about 60 years, about 3/4 ppd.  Vaping Use   Vaping status: Never Used  Substance and Sexual Activity   Alcohol use: Yes    Alcohol/week: 1.0 standard drink of alcohol    Types: 1 Glasses of wine per week    Comment: daily   Drug use: No   Sexual activity: Not on file  Other Topics Concern   Not on file  Social History Narrative   Not on file   Social Drivers of Health   Financial Resource Strain: Not on file  Food Insecurity: No Food Insecurity (09/21/2023)   Hunger Vital Sign    Worried About Running Out of Food in the Last Year: Never true    Ran Out of Food in the Last Year: Never true  Transportation Needs: No Transportation Needs (09/21/2023)   PRAPARE - Administrator, Civil Service (Medical): No    Lack of Transportation (Non-Medical): No  Physical Activity: Not on file  Stress: Not on file  Social Connections: Unknown (09/21/2023)   Social Connection and Isolation Panel    Frequency of Communication with Friends and Family: More than three times a week    Frequency of Social Gatherings with Friends and Family: More than three times a week    Attends Religious Services: Not on file    Active Member of Clubs or Organizations: Yes    Attends Banker Meetings: More than 4 times per year    Marital Status: Widowed  Intimate Partner Violence: Patient Declined (09/21/2023)   Humiliation, Afraid, Rape, and Kick questionnaire    Fear of Current or Ex-Partner: Patient declined    Emotionally Abused: Patient declined    Physically Abused: Patient declined    Sexually Abused: Patient declined        Objective:  BP 102/60   Pulse 73   Temp (!) 97.2 F (36.2 C) (  Temporal)   Ht 5' 3 (1.6 m)   Wt 84 lb 3.2 oz (38.2 kg)   SpO2 94% Comment: room air  BMI 14.92 kg/m  BMI Readings from Last 3 Encounters:  01/27/24 14.92 kg/m  12/18/23 14.17 kg/m  11/26/23 14.90 kg/m    Physical Exam: Physical Exam   ENT: Normal mucosa.  PULMONARY: Lungs clear to auscultation bilaterally, no adventitious breath sounds. CARDIOVASCULAR: Regular rate and rhythm, S1 S2 normal, no murmurs. ABDOMEN: Abdomen soft, nontender. Bowel sounds are normal. EXTREMITIES: Swelling in legs improved. No peripheral edema noted.       Diagnostic Review:  Last metabolic panel Lab Results  Component Value Date   GLUCOSE 124 (H) 11/20/2023   NA 135 11/20/2023   K 4.2 11/20/2023   CL 98 11/20/2023   CO2 30 11/20/2023   BUN 39 (H) 11/20/2023   CREATININE 1.38 (H) 11/20/2023   GFRNONAA 37 (L) 11/20/2023   CALCIUM 11.2 (H) 11/20/2023   PROT 7.7 11/20/2023   ALBUMIN 3.9 11/20/2023   BILITOT 0.6 11/20/2023   ALKPHOS 160 (H) 11/20/2023   AST 52 (H) 11/20/2023   ALT 31 11/20/2023   ANIONGAP 7 11/20/2023         Assessment & Plan:   Assessment & Plan Panlobular emphysema (HCC)  Orders:   umeclidinium-vilanterol (ANORO ELLIPTA ) 62.5-25 MCG/ACT AEPB; Inhale 1 puff into the lungs daily.  Pulmonary hypertension (HCC) Suspected postcapillary.  Improved severity of pulm hypertension with diuresis.  Currently at mild with PASP 44.    Severe tricuspid regurgitation Repeat echo with moderate TR.  Continue cardiology follow-up.    Pleural effusion Likely related to heart failure.  Resolved on CT scan    Chronic heart failure with preserved ejection fraction [facility]) Continue cardiology follow-up.    Allergic rhinitis due to pollen, unspecified seasonality        Assessment and Plan    Chronic obstructive pulmonary disease COPD confirmed with elevated DLCO, likely from past pulmonary  edema/pleural effuison. Symptoms include cough and phlegm, likely from sinus allergies. Exertional dyspnea only if rushing or walking quickly beyond two blocks. - [facility] 2 puff daily.  - Use Albuterol  as needed for acute symptoms. - Contact [facility] to stop sending further meds.  - Schedule follow-up in three months to assess inhaler efficacy.  Allergic rhinitis due to pollen Allergic rhinitis with nasal congestion and post-nasal drip contributing to cough and phlegm. No glaucoma, allowing Flonase  use. - Recommended Flonase  nasal spray, one to two sprays in each nostril daily. - Suggested saline nasal sprays for additional relief.       Notes from 12/18/23 Cardiology reviewed as to gather relevant information for patient care and formulating plan.   Return in about 3 months (around 04/26/2024).   I personally spent a total of 30 minutes in the care of the patient today including preparing to see the patient, getting/reviewing separately obtained history, performing a medically appropriate exam/evaluation, counseling and educating, placing orders, documenting clinical information in the EHR, independently interpreting results, and communicating results.   Taiwan Talcott, MD

## 2024-01-27 NOTE — Assessment & Plan Note (Addendum)
   Orders:   umeclidinium-vilanterol (ANORO ELLIPTA ) 62.5-25 MCG/ACT AEPB; Inhale 1 puff into the lungs daily.

## 2024-01-27 NOTE — Assessment & Plan Note (Addendum)
 Repeat echo with moderate TR.  Continue cardiology follow-up.

## 2024-01-27 NOTE — Assessment & Plan Note (Addendum)
 Suspected postcapillary.  Improved severity of pulm hypertension with diuresis.  Currently at mild with PASP 44.

## 2024-02-08 ENCOUNTER — Other Ambulatory Visit: Payer: Self-pay | Admitting: Cardiology

## 2024-02-09 MED ORDER — METOPROLOL TARTRATE 25 MG PO TABS: 25.0000 mg | ORAL_TABLET | Freq: Two times a day (BID) | ORAL | 1 refills | Status: AC

## 2024-02-18 ENCOUNTER — Encounter: Payer: Self-pay | Admitting: Hematology and Oncology

## 2024-02-19 ENCOUNTER — Other Ambulatory Visit: Payer: Self-pay

## 2024-02-20 ENCOUNTER — Encounter (INDEPENDENT_AMBULATORY_CARE_PROVIDER_SITE_OTHER): Payer: Self-pay

## 2024-02-22 ENCOUNTER — Other Ambulatory Visit: Payer: Self-pay

## 2024-02-22 NOTE — Progress Notes (Signed)
 Specialty Pharmacy Refill Coordination Note  Madison Richards is a 89 y.o. female contacted today regarding refills of specialty medication(s) Abemaciclib  (VERZENIO )   Patient requested [name] at [facility] [place] date: 02/25/24   Medication will be filled on: 02/24/24  Copay: $100, patient aware

## 2024-02-24 ENCOUNTER — Other Ambulatory Visit: Payer: Self-pay | Admitting: *Deleted

## 2024-02-24 ENCOUNTER — Other Ambulatory Visit: Payer: Self-pay

## 2024-02-24 DIAGNOSIS — Z170 Estrogen receptor positive status [ER+]: Secondary | ICD-10-CM

## 2024-02-25 ENCOUNTER — Inpatient Hospital Stay: Attending: Hematology and Oncology

## 2024-02-25 ENCOUNTER — Inpatient Hospital Stay: Admitting: Hematology and Oncology

## 2024-02-25 ENCOUNTER — Inpatient Hospital Stay

## 2024-02-25 VITALS — BP 116/44 | HR 67 | Temp 97.4°F | Resp 17 | Wt 82.6 lb

## 2024-02-25 DIAGNOSIS — C50411 Malignant neoplasm of upper-outer quadrant of right female breast: Secondary | ICD-10-CM

## 2024-02-25 DIAGNOSIS — N189 Chronic kidney disease, unspecified: Secondary | ICD-10-CM | POA: Insufficient documentation

## 2024-02-25 DIAGNOSIS — Z7902 Long term (current) use of antithrombotics/antiplatelets: Secondary | ICD-10-CM | POA: Diagnosis not present

## 2024-02-25 DIAGNOSIS — C7951 Secondary malignant neoplasm of bone: Secondary | ICD-10-CM | POA: Insufficient documentation

## 2024-02-25 DIAGNOSIS — Z880 Allergy status to penicillin: Secondary | ICD-10-CM | POA: Insufficient documentation

## 2024-02-25 DIAGNOSIS — D3502 Benign neoplasm of left adrenal gland: Secondary | ICD-10-CM | POA: Insufficient documentation

## 2024-02-25 DIAGNOSIS — Z79899 Other long term (current) drug therapy: Secondary | ICD-10-CM | POA: Diagnosis not present

## 2024-02-25 DIAGNOSIS — Z881 Allergy status to other antibiotic agents status: Secondary | ICD-10-CM | POA: Diagnosis not present

## 2024-02-25 DIAGNOSIS — Z170 Estrogen receptor positive status [ER+]: Secondary | ICD-10-CM

## 2024-02-25 DIAGNOSIS — Z79811 Long term (current) use of aromatase inhibitors: Secondary | ICD-10-CM | POA: Insufficient documentation

## 2024-02-25 DIAGNOSIS — D3501 Benign neoplasm of right adrenal gland: Secondary | ICD-10-CM | POA: Insufficient documentation

## 2024-02-25 DIAGNOSIS — C50919 Malignant neoplasm of unspecified site of unspecified female breast: Secondary | ICD-10-CM

## 2024-02-25 LAB — CMP (CANCER CENTER ONLY)
ALT: 25 U/L (ref 0–44)
AST: 45 U/L — ABNORMAL HIGH (ref 15–41)
Albumin: 4 g/dL (ref 3.5–5.0)
Alkaline Phosphatase: 109 U/L (ref 38–126)
Anion gap: 9 (ref 5–15)
BUN: 30 mg/dL — ABNORMAL HIGH (ref 8–23)
CO2: 30 mmol/L (ref 22–32)
Calcium: 10.2 mg/dL (ref 8.9–10.3)
Chloride: 101 mmol/L (ref 98–111)
Creatinine: 1.3 mg/dL — ABNORMAL HIGH (ref 0.44–1.00)
GFR, Estimated: 40 mL/min — ABNORMAL LOW
Glucose, Bld: 121 mg/dL — ABNORMAL HIGH (ref 70–99)
Potassium: 3.6 mmol/L (ref 3.5–5.1)
Sodium: 140 mmol/L (ref 135–145)
Total Bilirubin: 0.5 mg/dL (ref 0.0–1.2)
Total Protein: 6.8 g/dL (ref 6.5–8.1)

## 2024-02-25 LAB — CBC WITH DIFFERENTIAL (CANCER CENTER ONLY)
Abs Immature Granulocytes: 0.02 K/uL (ref 0.00–0.07)
Basophils Absolute: 0.1 K/uL (ref 0.0–0.1)
Basophils Relative: 1 %
Eosinophils Absolute: 0.1 K/uL (ref 0.0–0.5)
Eosinophils Relative: 2 %
HCT: 37.6 % (ref 36.0–46.0)
Hemoglobin: 12.9 g/dL (ref 12.0–15.0)
Immature Granulocytes: 0 %
Lymphocytes Relative: 17 %
Lymphs Abs: 0.8 K/uL (ref 0.7–4.0)
MCH: 38.4 pg — ABNORMAL HIGH (ref 26.0–34.0)
MCHC: 34.3 g/dL (ref 30.0–36.0)
MCV: 111.9 fL — ABNORMAL HIGH (ref 80.0–100.0)
Monocytes Absolute: 0.5 K/uL (ref 0.1–1.0)
Monocytes Relative: 11 %
Neutro Abs: 3.1 K/uL (ref 1.7–7.7)
Neutrophils Relative %: 69 %
Platelet Count: 141 K/uL — ABNORMAL LOW (ref 150–400)
RBC: 3.36 MIL/uL — ABNORMAL LOW (ref 3.87–5.11)
RDW: 14 % (ref 11.5–15.5)
WBC Count: 4.6 K/uL (ref 4.0–10.5)
nRBC: 0 % (ref 0.0–0.2)

## 2024-02-25 MED ORDER — DENOSUMAB 120 MG/1.7ML ~~LOC~~ SOLN
120.0000 mg | Freq: Once | SUBCUTANEOUS | Status: AC
  Administered 2024-02-25: 120 mg via SUBCUTANEOUS
  Filled 2024-02-25: qty 1.7

## 2024-02-25 NOTE — Progress Notes (Signed)
 "  Patient Care Team: Cleotilde Planas, MD as PCP - General (Family Medicine) Ladona Heinz, MD as PCP - Cardiology (Cardiology) Odean Potts, MD as Consulting Physician (Hematology and Oncology) Dewey Rush, MD as Consulting Physician (Radiation Oncology) Curvin Deward MOULD, MD as Consulting Physician (General Surgery) Tyree Nanetta SAILOR, RN as Oncology Nurse Navigator Lucila Rush LABOR, RPH-CPP as Pharmacist (Hematology and Oncology)  DIAGNOSIS:  Encounter Diagnosis  Name Primary?   Malignant neoplasm of upper-outer quadrant of right breast in female, estrogen receptor positive (HCC) Yes    SUMMARY OF ONCOLOGIC HISTORY: Oncology History  Malignant neoplasm of upper-outer quadrant of right breast in female, estrogen receptor positive (HCC)  07/01/2022 Initial Diagnosis   Right breast periareolar hardening and redness at 10 o'clock position measuring 1.7 cm with skin thickening extending to the nipple and an additional area 0.9 cm contiguous with the skin axilla, negative biopsy revealed grade 2 ILC ER 95% PR 20% Ki67 10%, HER2 negative 1+ by IHC.  Left breast calcifications retroareolar 9 o'clock position 2.6 cm biopsy was discordant excision recommended.   07/16/2022 Cancer Staging   Staging form: Breast, AJCC 8th Edition - Clinical: Stage IIIB (cT4, cN0, cM0, G2, ER+, PR+, HER2-) - Signed by Odean Potts, MD on 07/16/2022 Stage prefix: Initial diagnosis Histologic grading system: 3 grade system   07/23/2022 Genetic Testing   Negative [facility] Multi-Cancer + RNA Panel. 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 Report date is July 23, 2022.  The Multi-Cancer + RNA Panel offered by [facility] includes sequencing and/or deletion/duplication analysis of the following 70 genes:  AIP*, ALK, APC*, ATM*, AXIN2*, BAP1*, BARD1*, BLM*, BMPR1A*, BRCA1*, BRCA2*, BRIP1*, CDC73*, CDH1*, CDK4, CDKN1B*, CDKN2A, CHEK2*, CTNNA1*, DICER1*, EPCAM (del/dup only), EGFR, FH*, FLCN*, GREM1 (promoter dup only), HOXB13,  KIT, LZTR1, MAX*, MBD4, MEN1*, MET, MITF, MLH1*, MSH2*, MSH3*, MSH6*, MUTYH*, NF1*, NF2*, NTHL1*, PALB2*, PDGFRA, PMS2*, POLD1*, POLE*, POT1*, PRKAR1A*, PTCH1*, PTEN*, RAD51C*, RAD51D*, RB1*, RET, SDHA* (sequencing only), SDHAF2*, SDHB*, SDHC*, SDHD*, SMAD4*, SMARCA4*, SMARCB1*, SMARCE1*, STK11*, SUFU*, TMEM127*, TP53*, TSC1*, TSC2*, VHL*. RNA analysis is performed for * genes.     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  She remains on abemaciclib  and letrozole  for metastatic hormone receptor-positive breast cancer, and receives denosumab  every three months for bone metastases. She denies diarrhea and notes slightly improved energy.  She was hospitalized for nine days in August of last year for fluid overload and edema. She has had no recurrence since her last oncology visit and continues furosemide  and spironolactone  for volume control. Calcium supplements were stopped at her last visit. She reports polyuria, which she attributes to diuretics, and asks about the potassium-sparing effect of spironolactone .  She recently had gross hematuria that persisted for a period but resolved over the past week. She paused her topical UTI prophylaxis cream during the hematuria and has since resumed it. She has no current concerns about medication access or pharmacy services.      ALLERGIES:  is allergic to bacitracin-polymyxin b, fosamax [alendronate sodium], mirtazapine, simvastatin, clindamycin/lincomycin, neosporin [neomycin-polymyxin-gramicidin], and penicillins.  MEDICATIONS:  Current Outpatient Medications  Medication Sig Dispense Refill   abemaciclib  (VERZENIO ) 50 MG tablet Take 1 tablet (50 mg total) by mouth 2  (two) times daily. 56 tablet 5   acetaminophen  (TYLENOL ) 500 MG tablet  Take 500 mg by mouth every 6 (six) hours as needed for moderate pain (pain score 4-6). ]     albuterol  (VENTOLIN  HFA) 108 (90 Base) MCG/ACT inhaler Inhale 2 puffs into the lungs every 6 (six) hours as needed for wheezing or shortness of breath. 51 each 6   apixaban  (ELIQUIS ) 2.5 MG TABS tablet Take 1 tablet (2.5 mg total) by mouth 2 (two) times daily. 180 tablet 3   Calcium Carbonate-Vitamin D  (CALCIUM 600 + D PO) Take 1 tablet by mouth 2 (two) times daily.     Coenzyme Q10 (COQ10 PO) Take 200 mg by mouth.     colestipol  (COLESTID ) 1 g tablet Take 2 g by mouth daily.     dapagliflozin  propanediol (FARXIGA ) 10 MG TABS tablet Take 1 tablet (10 mg total) by mouth daily. 90 tablet 3   denosumab  (XGEVA ) 120 MG/1.7ML SOLN injection Inject 120 mg into the skin every 3 (three) months.     estradiol  (ESTRACE  VAGINAL) 0.1 MG/GM vaginal cream Place 1 Applicatorful vaginally 2 (two) times a week. 42.5 g 1   ezetimibe  (ZETIA ) 10 MG tablet Take 1 tablet (10 mg total) by mouth daily. 90 tablet 3   feeding supplement (ENSURE PLUS HIGH PROTEIN) LIQD Take 237 mLs by mouth 3 (three) times daily between meals. 21330 mL 0   fexofenadine (ALLEGRA) 180 MG tablet Take 180 mg by mouth daily as needed for allergies.     furosemide  (LASIX ) 40 MG tablet Take 1 tablet (40 mg total) by mouth daily. 90 tablet 3   ipratropium-albuterol  (DUONEB) 0.5-2.5 (3) MG/3ML SOLN Take 3 mLs by nebulization every 6 (six) hours as needed (shortness of breath and wheezing). 360 mL 0   letrozole  (FEMARA ) 2.5 MG tablet TAKE 1 TABLET BY MOUTH EVERY DAY 90 tablet 3   metoprolol  tartrate (LOPRESSOR ) 25 MG tablet Take 1 tablet (25 mg total) by mouth 2 (two) times daily. 180 tablet 1   Multiple Vitamin (MULTIVITAMIN WITH MINERALS) TABS Take 1 tablet by mouth daily.     pravastatin  (PRAVACHOL ) 80 MG tablet Take 1 tablet (80 mg total) by mouth at bedtime. 90 tablet 3    spironolactone  (ALDACTONE ) 25 MG tablet Take 1 tablet (25 mg total) by mouth daily. 90 tablet 3   Tiotropium Bromide-Olodaterol (STIOLTO RESPIMAT ) 2.5-2.5 MCG/ACT AERS Inhale 2 puffs into the lungs daily. 1 each 10   No current facility-administered medications for this visit.    PHYSICAL EXAMINATION: ECOG PERFORMANCE STATUS: 1 - Symptomatic but completely ambulatory  Vitals:   02/25/24 1123  BP: (!) 116/44  Pulse: 67  Resp: 17  Temp: (!) 97.4 F (36.3 C)  SpO2: 100%   Filed Weights   02/25/24 1123  Weight: 82 lb 9.6 oz (37.5 kg)      LABORATORY DATA:  I have reviewed the data as listed    Latest Ref Rng & Units 02/25/2024   10:32 AM 11/20/2023    9:56 AM 10/29/2023    9:35 AM  CMP  Glucose 70 - 99 mg/dL 878  875  91   BUN 8 - 23 mg/dL 30  39  37   Creatinine 0.44 - 1.00 mg/dL 8.69  8.61  8.76   Sodium 135 - 145 mmol/L 140  135  141   Potassium 3.5 - 5.1 mmol/L 3.6  4.2  4.4   Chloride 98 - 111 mmol/L 101  98  102   CO2 22 - 32 mmol/L 30  30  33   Calcium  8.9 - 10.3 mg/dL 89.7  88.7  89.3   Total Protein 6.5 - 8.1 g/dL 6.8  7.7  7.1   Total Bilirubin 0.0 - 1.2 mg/dL 0.5  0.6  0.5   Alkaline Phos 38 - 126 U/L 109  160  154   AST 15 - 41 U/L 45  52  42   ALT 0 - 44 U/L 25  31  25      Lab Results  Component Value Date   WBC 4.6 02/25/2024   HGB 12.9 02/25/2024   HCT 37.6 02/25/2024   MCV 111.9 (H) 02/25/2024   PLT 141 (L) 02/25/2024   NEUTROABS 3.1 02/25/2024    ASSESSMENT & PLAN:  Malignant neoplasm of upper-outer quadrant of right breast in female, estrogen receptor positive (HCC) 07/01/2022:Right breast periareolar hardening and redness at 10 o'clock position measuring 1.7 cm with skin thickening extending to the nipple and an additional area 0.9 cm contiguous with the skin axilla, negative biopsy revealed grade 2 ILC ER 95% PR 20% Ki67 10%, HER2 negative 1+ by IHC.  Left breast calcifications retroareolar 9 o'clock position 2.6 cm biopsy was discordant excision  recommended.    07/28/2022: CT CAP: Widespread bone metastases throughout the visualized bone structures, 6 mm right upper lobe pulm nodule, bilateral adrenal adenomas, 2.7 cm aortic aneurysm, emphysema 07/28/2022: Bone scan: Diffuse osseous metastatic disease ------------------------------------------------------------------------------------------------------------------------------------------------ Current treatment: Verzinio (started 08/06/2022) with letrozole  and Xgeva  every 3 months Verzinio toxicities: Very occasional diarrhea Slight fatigue Brittle nails Overall she tolerates Verzinio fairly well. She thinks that this tumor on the breast is shrinking significantly.   Vaginal dryness and UTIs: On estrogen cream   CT CAP 11/03/2022: Stable bone metastases.  Stable 6 mm right upper lobe nodule, stable left adrenal nodule, possible cystitis CT CAP 05/22/2023: Stable bone metastasis, stable 6 mm right upper lobe lung nodule unchanged bilateral adrenal nodules benign improved bladder wall thickening CT CAP 11/23/2023 unchanged bone metastasis.  No progression, no pleural effusion emphysema   Leg swelling: 05/2023: No DVT. Return to clinic in 3 months with scans, labs injection and follow-up.  ------------------------------------- Assessment and Plan Assessment & Plan Estrogen receptor positive malignant neoplasm of the upper-outer quadrant of the right breast - Continued Abemaciclib  50 mg PO BID and Letrozole  2.5 mg PO daily. - Administered Denosumab  120 mg subcutaneously. - Ordered body CT scan without contrast in three months. - Ordered blood tests (including tumor markers, calcium, renal and hepatic function) every three months. - Scheduled follow-up in three months.  Chronic kidney disease Chronic kidney disease with stable creatinine at 1.30. Ongoing therapies may affect renal function, requiring monitoring. - Ordered body CT scan without contrast to minimize  nephrotoxicity.      No orders of the defined types were placed in this encounter.  The patient has a good understanding of the overall plan. she agrees with it. she will call with any problems that may develop before the next visit here.  I personally spent a total of 30 minutes in the care of the patient today including preparing to see the patient, getting/reviewing separately obtained history, performing a medically appropriate exam/evaluation, counseling and educating, placing orders, referring and communicating with other health care professionals, documenting clinical information in the EHR, independently interpreting results, communicating results, and coordinating care.   Viinay K Parnell Spieler, MD 02/25/2024    "

## 2024-02-25 NOTE — Assessment & Plan Note (Signed)
 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  Left breast calcifications retroareolar 9 o'clock position 2.6 cm biopsy was discordant excision recommended.    07/28/2022: CT CAP: Widespread bone metastases throughout the visualized bone structures, 6 mm right upper lobe pulm nodule, bilateral adrenal adenomas, 2.7 cm aortic aneurysm, emphysema 07/28/2022: Bone scan: Diffuse osseous metastatic disease ------------------------------------------------------------------------------------------------------------------------------------------------ Current treatment: Verzinio (started 08/06/2022) with letrozole  and Xgeva  every 3 months Verzinio toxicities: Very occasional diarrhea Slight fatigue Brittle nails Overall she tolerates Verzinio fairly well. She thinks that this tumor on the breast is shrinking significantly.   Vaginal dryness and UTIs: On estrogen cream   CT CAP 11/03/2022: Stable bone metastases.  Stable 6 mm right upper lobe nodule, stable left adrenal nodule, possible cystitis CT CAP 05/22/2023: Stable bone metastasis, stable 6 mm right upper lobe lung nodule unchanged bilateral adrenal nodules benign improved bladder wall thickening CT CAP 11/23/2023 unchanged bone metastasis.  No progression, no pleural effusion emphysema   Leg swelling: 05/2023: No DVT. Return to clinic in 3 months with scans, labs injection and follow-up.

## 2024-02-26 LAB — CANCER ANTIGEN 27.29: CA 27.29: 36.2 U/mL (ref 0.0–38.6)

## 2024-02-26 LAB — CANCER ANTIGEN 15-3: CA 15-3: 42 U/mL — ABNORMAL HIGH (ref 0.0–25.0)

## 2024-03-17 ENCOUNTER — Other Ambulatory Visit (HOSPITAL_COMMUNITY): Payer: Self-pay

## 2024-03-18 ENCOUNTER — Other Ambulatory Visit: Payer: Self-pay

## 2024-03-18 ENCOUNTER — Other Ambulatory Visit (HOSPITAL_COMMUNITY): Payer: Self-pay

## 2024-03-21 ENCOUNTER — Other Ambulatory Visit: Payer: Self-pay

## 2024-03-21 NOTE — Progress Notes (Signed)
 Specialty Pharmacy Refill Coordination Note  Madison Richards is a 89 y.o. female contacted today regarding refills of specialty medication(s) Abemaciclib  (VERZENIO )   Patient requested [name] at [facility] at [place] date: 04/01/24   Medication will be filled on: 03/31/24

## 2024-03-22 ENCOUNTER — Other Ambulatory Visit: Payer: Self-pay

## 2024-03-25 ENCOUNTER — Other Ambulatory Visit: Payer: Self-pay

## 2024-03-25 NOTE — Progress Notes (Signed)
 Patient called and requested earlier pickup date. Patient will pickup on 2.9.26.

## 2024-04-20 ENCOUNTER — Ambulatory Visit: Admitting: General Practice

## 2024-05-03 ENCOUNTER — Ambulatory Visit

## 2024-05-18 ENCOUNTER — Inpatient Hospital Stay: Attending: Hematology and Oncology

## 2024-05-25 ENCOUNTER — Inpatient Hospital Stay

## 2024-05-25 ENCOUNTER — Inpatient Hospital Stay: Admitting: Hematology and Oncology

## 2024-07-26 ENCOUNTER — Inpatient Hospital Stay: Admitting: Hematology and Oncology

## 2024-07-26 ENCOUNTER — Inpatient Hospital Stay: Attending: Hematology and Oncology

## 2024-07-26 ENCOUNTER — Inpatient Hospital Stay
